# Patient Record
Sex: Female | Born: 1960
Health system: Southern US, Community
[De-identification: ages and names within clinical notes are randomized; demographics above are authoritative.]

## PROBLEM LIST (undated history)

## (undated) ENCOUNTER — Emergency Department (HOSPITAL_BASED_OUTPATIENT_CLINIC_OR_DEPARTMENT_OTHER): Admission: EM | Payer: Medicaid Other | Source: Home / Self Care

## (undated) DIAGNOSIS — C44309 Unspecified malignant neoplasm of skin of other parts of face: Secondary | ICD-10-CM

## (undated) DIAGNOSIS — F509 Eating disorder, unspecified: Secondary | ICD-10-CM

## (undated) DIAGNOSIS — B009 Herpesviral infection, unspecified: Secondary | ICD-10-CM

## (undated) DIAGNOSIS — F419 Anxiety disorder, unspecified: Secondary | ICD-10-CM

## (undated) DIAGNOSIS — R011 Cardiac murmur, unspecified: Secondary | ICD-10-CM

## (undated) DIAGNOSIS — N200 Calculus of kidney: Secondary | ICD-10-CM

## (undated) DIAGNOSIS — F4310 Post-traumatic stress disorder, unspecified: Secondary | ICD-10-CM

## (undated) DIAGNOSIS — A159 Respiratory tuberculosis unspecified: Secondary | ICD-10-CM

## (undated) DIAGNOSIS — N189 Chronic kidney disease, unspecified: Secondary | ICD-10-CM

## (undated) DIAGNOSIS — F329 Major depressive disorder, single episode, unspecified: Secondary | ICD-10-CM

## (undated) DIAGNOSIS — F32A Depression, unspecified: Secondary | ICD-10-CM

## (undated) DIAGNOSIS — G40909 Epilepsy, unspecified, not intractable, without status epilepticus: Secondary | ICD-10-CM

## (undated) HISTORY — DX: Anxiety disorder, unspecified: F41.9

## (undated) HISTORY — PX: CERVICAL POLYPECTOMY: SHX88

## (undated) HISTORY — DX: Eating disorder, unspecified: F50.9

## (undated) HISTORY — DX: Chronic kidney disease, unspecified: N18.9

## (undated) HISTORY — PX: OTHER SURGICAL HISTORY: SHX169

## (undated) HISTORY — DX: Calculus of kidney: N20.0

## (undated) HISTORY — PX: KIDNEY SURGERY: SHX687

## (undated) HISTORY — DX: Epilepsy, unspecified, not intractable, without status epilepticus: G40.909

## (undated) HISTORY — DX: Post-traumatic stress disorder, unspecified: F43.10

## (undated) HISTORY — DX: Major depressive disorder, single episode, unspecified: F32.9

## (undated) HISTORY — PX: LAPAROSCOPIC NEPHRECTOMY: SUR781

## (undated) HISTORY — DX: Cardiac murmur, unspecified: R01.1

## (undated) HISTORY — PX: CHOLECYSTECTOMY: SHX55

## (undated) HISTORY — DX: Herpesviral infection, unspecified: B00.9

## (undated) HISTORY — DX: Unspecified malignant neoplasm of skin of other parts of face: C44.309

## (undated) HISTORY — DX: Respiratory tuberculosis unspecified: A15.9

## (undated) HISTORY — DX: Depression, unspecified: F32.A

---

## 2008-10-02 HISTORY — PX: LAPAROSCOPIC NEPHRECTOMY: SUR781

## 2009-01-26 ENCOUNTER — Other Ambulatory Visit: Admission: RE | Admit: 2009-01-26 | Discharge: 2009-01-26 | Payer: Self-pay | Admitting: Family Medicine

## 2009-01-26 ENCOUNTER — Ambulatory Visit: Payer: Self-pay | Admitting: Family Medicine

## 2009-01-26 ENCOUNTER — Encounter: Payer: Self-pay | Admitting: Family Medicine

## 2009-01-26 DIAGNOSIS — R21 Rash and other nonspecific skin eruption: Secondary | ICD-10-CM | POA: Insufficient documentation

## 2009-01-26 DIAGNOSIS — R569 Unspecified convulsions: Secondary | ICD-10-CM | POA: Insufficient documentation

## 2009-01-26 DIAGNOSIS — M25569 Pain in unspecified knee: Secondary | ICD-10-CM | POA: Insufficient documentation

## 2009-01-27 ENCOUNTER — Encounter: Payer: Self-pay | Admitting: Family Medicine

## 2009-01-27 ENCOUNTER — Telehealth (INDEPENDENT_AMBULATORY_CARE_PROVIDER_SITE_OTHER): Payer: Self-pay | Admitting: *Deleted

## 2009-01-28 LAB — CONVERTED CEMR LAB: Pap Smear: NORMAL

## 2009-02-08 ENCOUNTER — Telehealth: Payer: Self-pay | Admitting: Family Medicine

## 2009-02-09 ENCOUNTER — Telehealth (INDEPENDENT_AMBULATORY_CARE_PROVIDER_SITE_OTHER): Payer: Self-pay | Admitting: *Deleted

## 2009-02-16 ENCOUNTER — Encounter: Admission: RE | Admit: 2009-02-16 | Discharge: 2009-02-16 | Payer: Self-pay | Admitting: Family Medicine

## 2009-02-17 ENCOUNTER — Encounter: Payer: Self-pay | Admitting: Family Medicine

## 2009-02-18 ENCOUNTER — Encounter: Payer: Self-pay | Admitting: Family Medicine

## 2009-02-18 LAB — CONVERTED CEMR LAB
ALT: 11 units/L (ref 0–35)
AST: 16 units/L (ref 0–37)
Albumin: 4.1 g/dL (ref 3.5–5.2)
Alkaline Phosphatase: 96 units/L (ref 39–117)
BUN: 12 mg/dL (ref 6–23)
CO2: 23 meq/L (ref 19–32)
Calcium: 9.1 mg/dL (ref 8.4–10.5)
Chloride: 106 meq/L (ref 96–112)
Cholesterol: 185 mg/dL (ref 0–200)
Creatinine, Ser: 0.73 mg/dL (ref 0.40–1.20)
Folate: 18.2 ng/mL
Glucose, Bld: 100 mg/dL — ABNORMAL HIGH (ref 70–99)
HDL: 61 mg/dL (ref >39)
Iron: 90 ug/dL (ref 42–145)
LDL Cholesterol: 99 mg/dL (ref 0–99)
Magnesium: 1.9 mg/dL (ref 1.5–2.5)
Potassium: 4.2 meq/L (ref 3.5–5.3)
Saturation Ratios: 26 % (ref 20–55)
Sodium: 141 meq/L (ref 135–145)
TIBC: 346 ug/dL (ref 250–470)
TSH: 2.388 microintl units/mL (ref 0.350–4.500)
Total Bilirubin: 0.4 mg/dL (ref 0.3–1.2)
Total CHOL/HDL Ratio: 3
Total Protein: 7 g/dL (ref 6.0–8.3)
Triglycerides: 123 mg/dL (ref <150)
UIBC: 256 ug/dL
VLDL: 25 mg/dL (ref 0–40)
Vit D, 25-Hydroxy: 24 ng/mL — ABNORMAL LOW (ref 30–89)
Vitamin B-12: 634 pg/mL (ref 211–911)

## 2009-03-03 ENCOUNTER — Ambulatory Visit: Payer: Self-pay | Admitting: Family Medicine

## 2009-03-24 ENCOUNTER — Encounter: Payer: Self-pay | Admitting: Emergency Medicine

## 2009-03-24 ENCOUNTER — Inpatient Hospital Stay (HOSPITAL_COMMUNITY): Admission: EM | Admit: 2009-03-24 | Discharge: 2009-03-29 | Payer: Self-pay | Admitting: Family Medicine

## 2009-03-24 ENCOUNTER — Ambulatory Visit: Payer: Self-pay | Admitting: Diagnostic Radiology

## 2009-03-24 ENCOUNTER — Ambulatory Visit: Payer: Self-pay | Admitting: Family Medicine

## 2009-03-24 ENCOUNTER — Encounter: Payer: Self-pay | Admitting: Family Medicine

## 2009-03-24 IMAGING — CT CT ABDOMEN W/O CM
2 of 3 series · 15 of 46 positions shown, 17 images · non-contrast
Comparison: None

CT ABDOMEN

CLINICAL DATA: Fever.  Right-sided pain.  The patient passed
"HUBER" today.  History of kidney stones 2 months ago.

CT ABDOMEN AND PELVIS WITHOUT CONTRAST
TECHNIQUE: Multidetector CT imaging of the abdomen and pelvis was
performed following the standard protocol without intravenous
contrast.

[Series 2: renal stone > 200 lbs 5.0 b31f · axial · 0.69mm/px · z∈[-418,-22]mm · 12 of 91 slices shown, 14 images]
[im 6/91  soft-tissue]
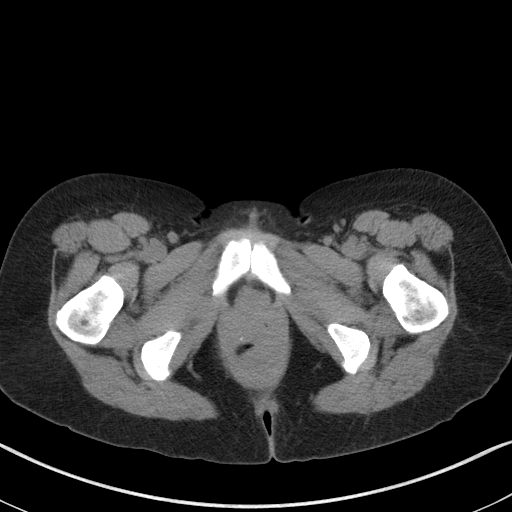
[im 6/91  bone]
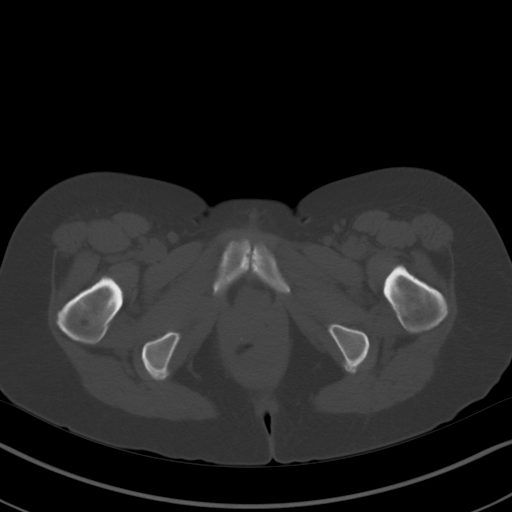
[im 12/91  soft-tissue]
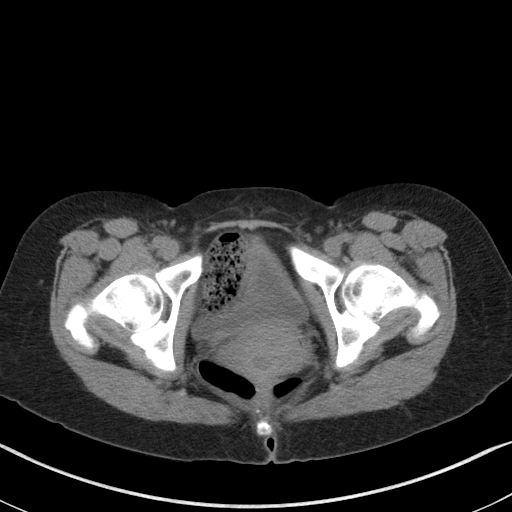
[im 21/91  soft-tissue]
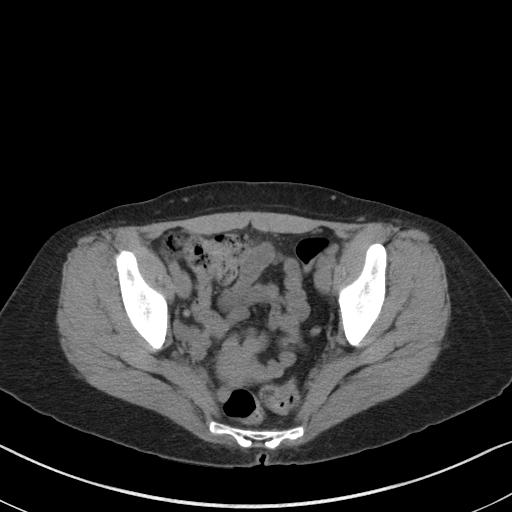
[im 27/91  soft-tissue]
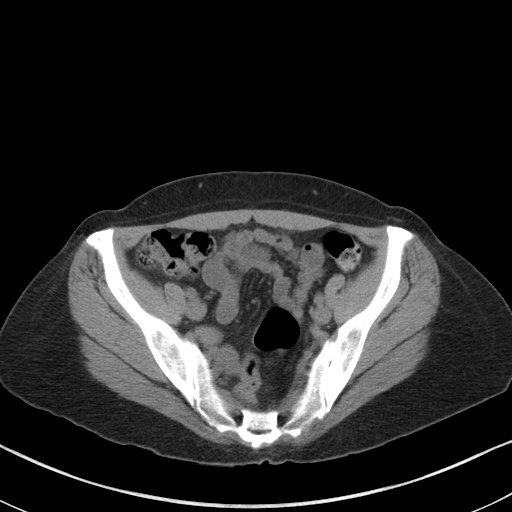
[im 35/91  soft-tissue]
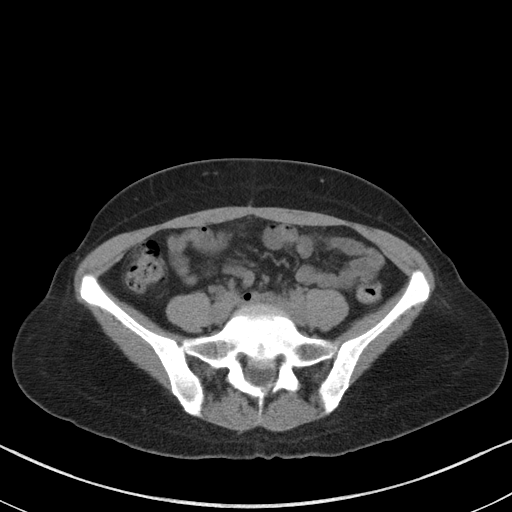
[im 41/91  soft-tissue]
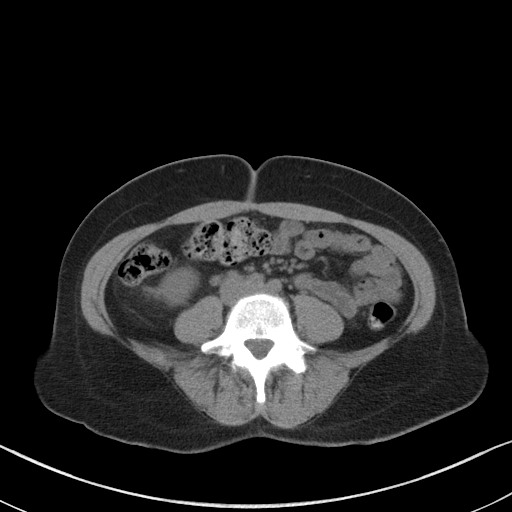
[im 50/91  soft-tissue]
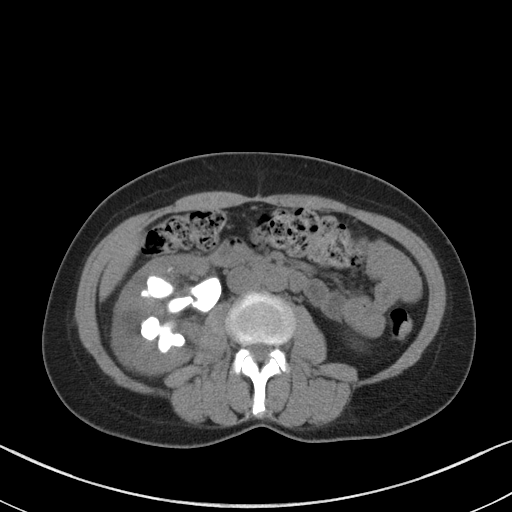
[im 56/91  soft-tissue]
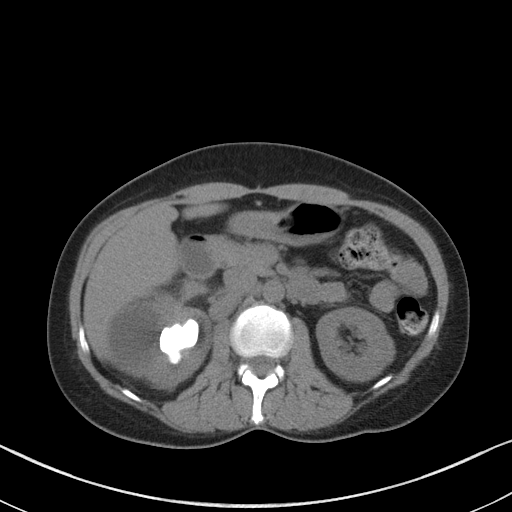
[im 64/91  soft-tissue]
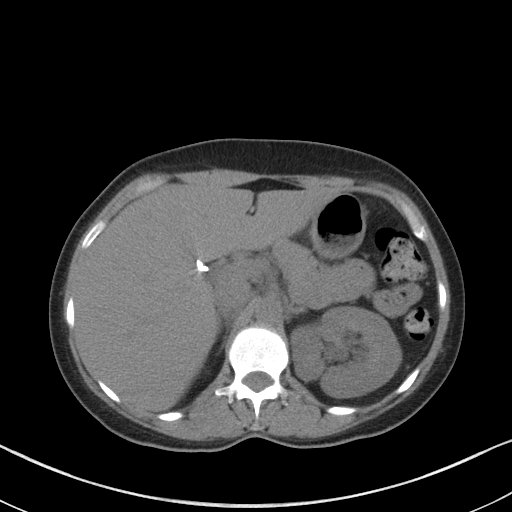
[im 64/91  bone]
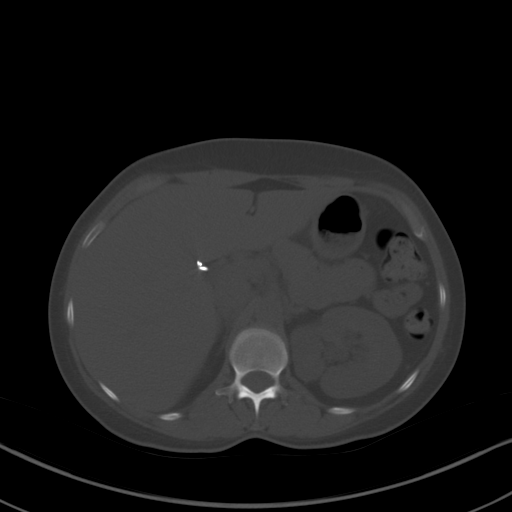
[im 70/91  soft-tissue]
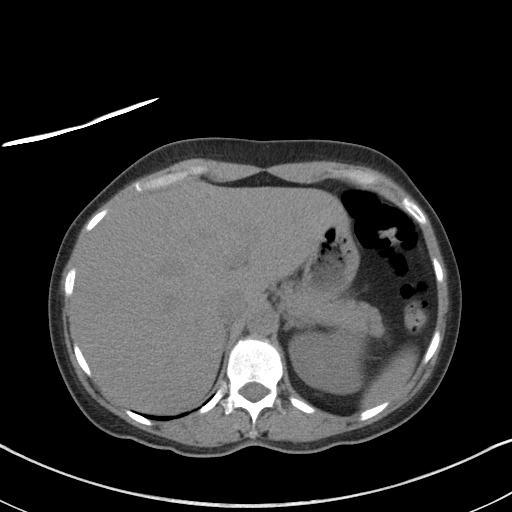
[im 79/91  soft-tissue]
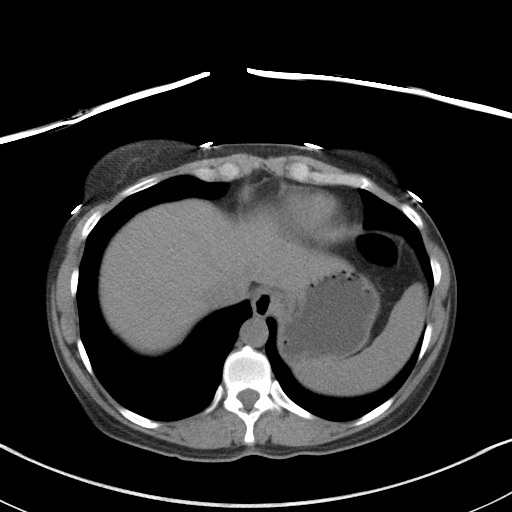
[im 85/91  soft-tissue]
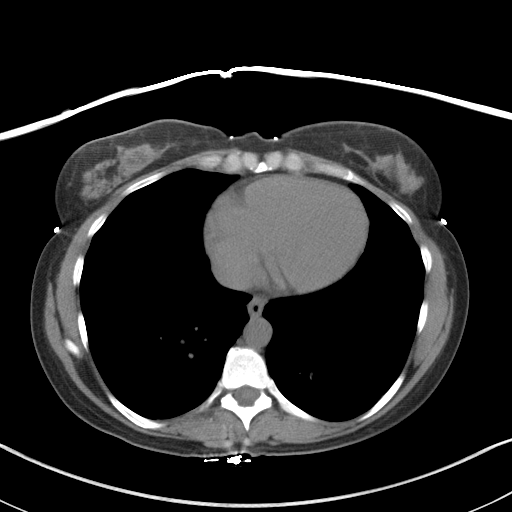

[Series 5: renal stone 3.0 coronal · coronal · 0.55mm/px · 3 of 81 slices shown]
[im 27/81  soft-tissue]
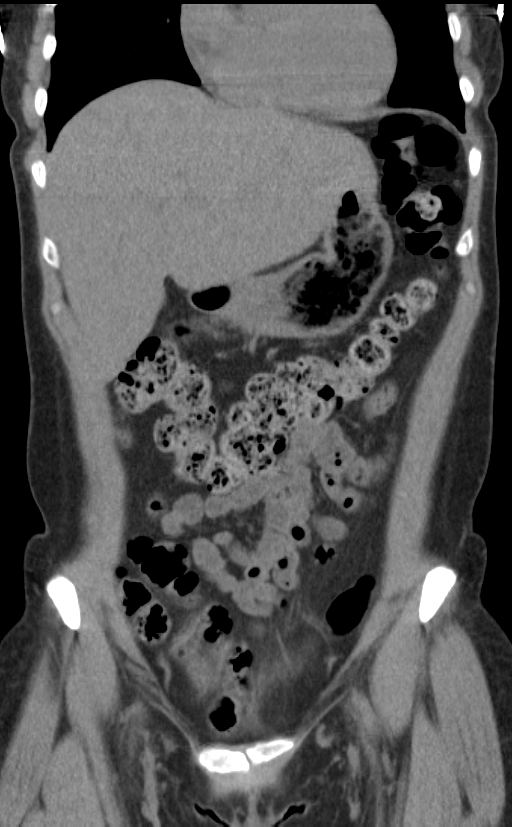
[im 36/81  soft-tissue]
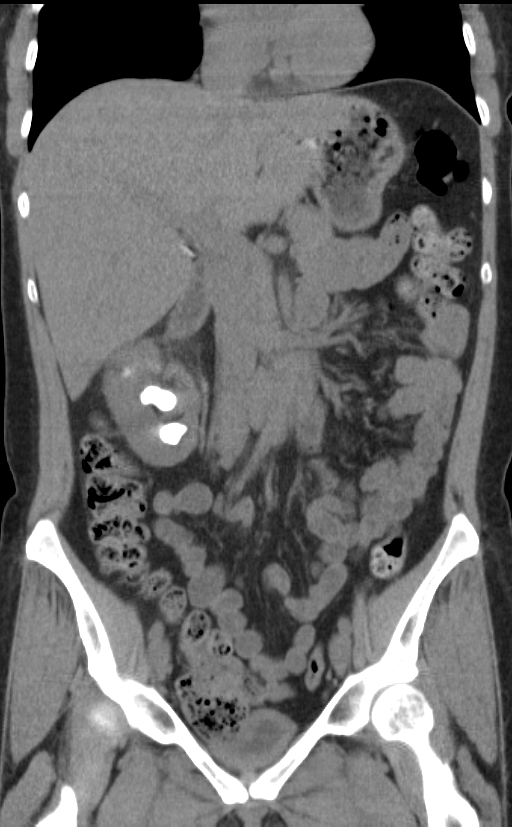
[im 45/81  soft-tissue]
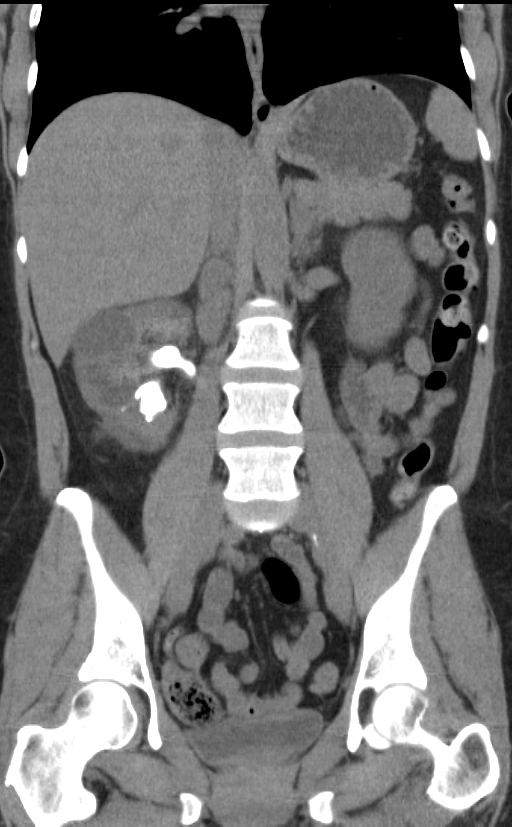

[15 of 46 positions shown; findings below may reference images not displayed]

FINDINGS: Images of the lung bases show no nodules, pleural
effusions, or infiltrates.

Large right staghorn calculus is identified, extending into the
right renal pelvis.  The right kidney is enlarged, measuring 12 cm
in cranial caudal length. There are low attenuation lesions in the
right kidney, possibly representing cysts.  However, infection
should also be considered.  Xanthogranulomatous pyelonephritis is a
consideration.  The largest low attenuation lesion is in the mid
pole region, measuring 4.1 cm in diameter.

There is adenopathy in the retroperitoneal region beginning just
below the level of the right adrenal gland and extending to the
level of the right renal hilum.  Lymph nodes measure on the order
of 2.5 cm in diameter.  There is also intra-aortocaval adenopathy.
Differential diagnosis includes infection or malignancy.

The left kidney contains scattered 2-3 mm calcifications.  No
ureteral calculi identified on the left.

The patient has had cholecystectomy.  No focal abnormality is
identified within the liver, spleen, pancreas, or adrenal glands.
IMPRESSION: 1.  Enlarged right kidney containing large staghorn calculus.
Multiple low-density lesions throughout the right kidney could
represent cysts, abscesses, low density solid masses, or chronic
infections such as xanthogranulomatous pyelonephritis.
2.  Retroperitoneal adenopathy could be related to chronic
infection or malignancy.  Follow-up is warranted.

CT PELVIS
FINDINGS: The uterus is present.  There is no free pelvic fluid or
pelvic adenopathy.  Adnexal regions have a normal appearance by CT.
Bone windows are unremarkable in appearance.
IMPRESSION: No evidence for acute pelvic abnormality.

Critical test results telephoned to Dr. HUBER at the time of
interpretation on [DATE] at [DATE] p.m.

## 2009-03-25 IMAGING — CT CT ABDOMEN W/ CM
2 of 8 series · 13 of 46 positions shown, 19 images · IV contrast (APPLIED)
Comparison: Yesterday

CT ABDOMEN

CLINICAL DATA: Fever and right side pain

CT ABDOMEN  WITH CONTRAST
TECHNIQUE: Multidetector CT imaging of the abdomen was performed
using the standard protocol following bolus administration of
intravenous contrast.
Contrast: 100 ml [GZ]

[Series 2: renal arterial 3.0 b31f st · axial · arterial · 0.59mm/px · z∈[-315,-51]mm · 10 of 106 slices shown, 16 images]
[im 9/106  soft-tissue]
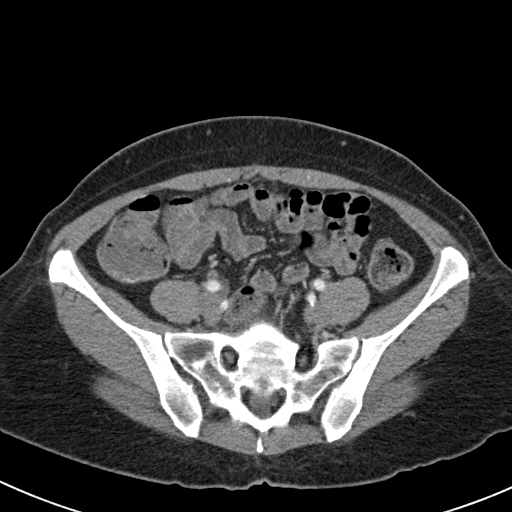
[im 9/106  bone]
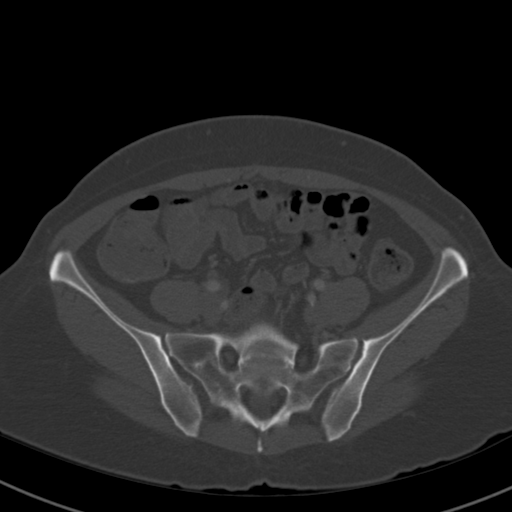
[im 18/106  soft-tissue]
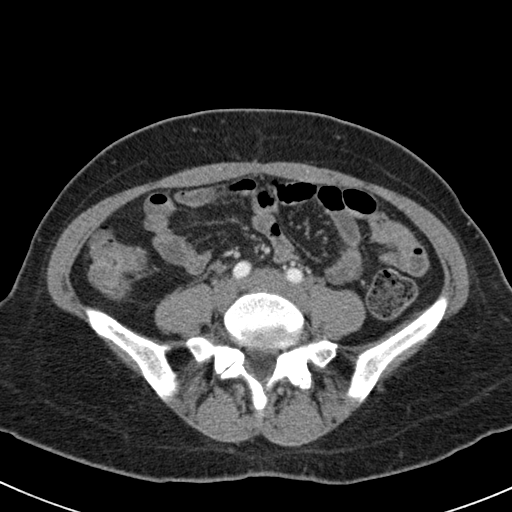
[im 27/106  soft-tissue]
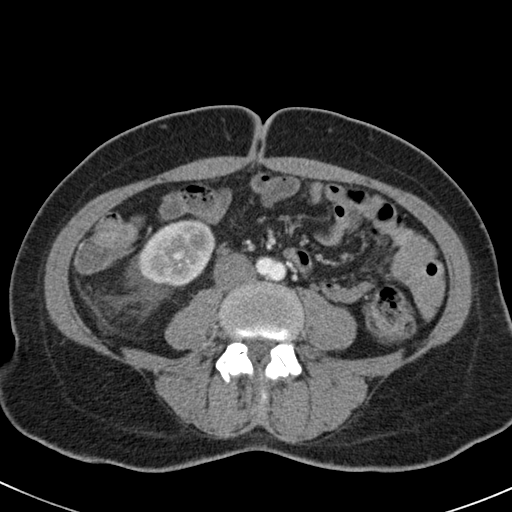
[im 36/106  soft-tissue]
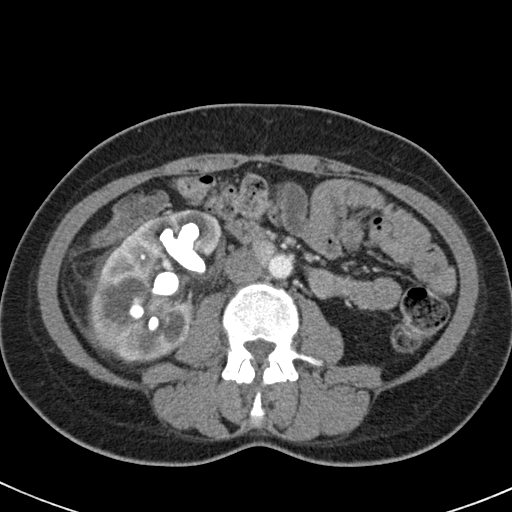
[im 44/106  soft-tissue]
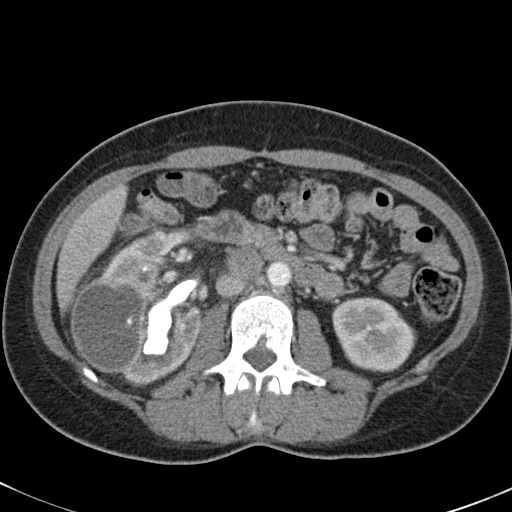
[im 62/106  soft-tissue]
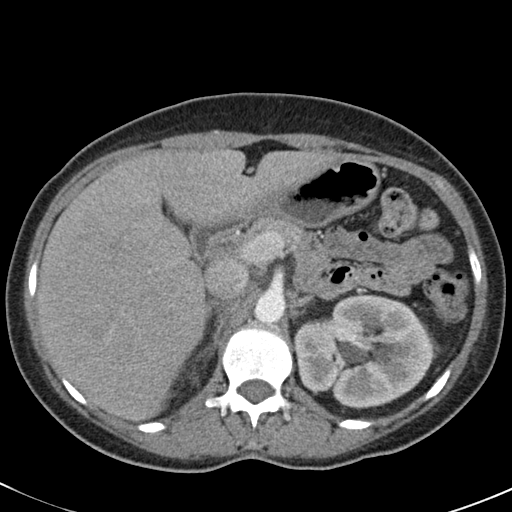
[im 71/106  soft-tissue]
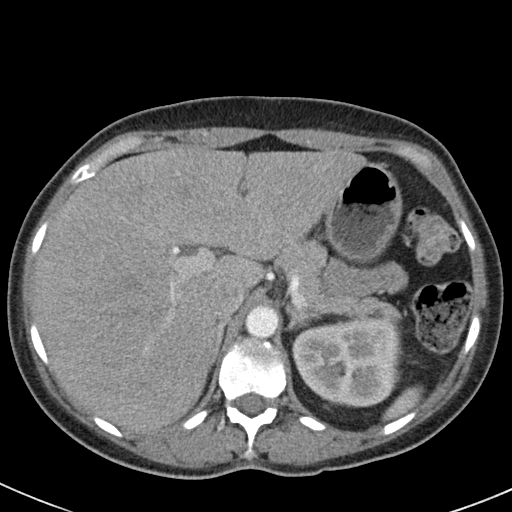
[im 71/106  lung]
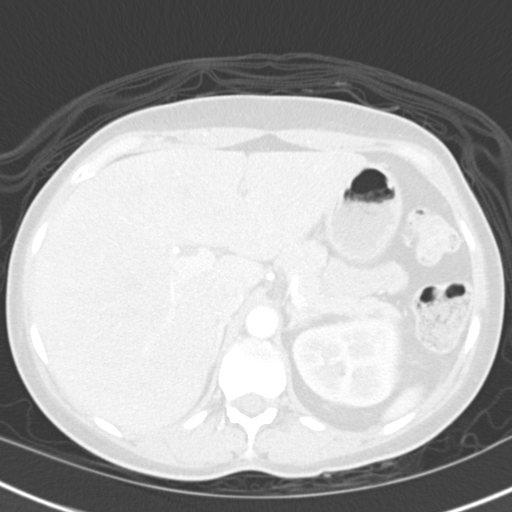
[im 79/106  soft-tissue]
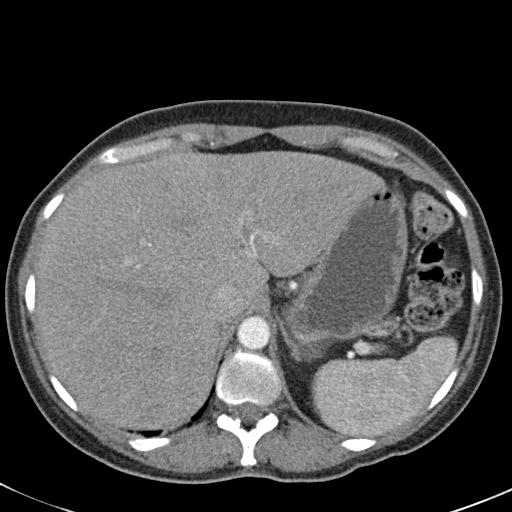
[im 79/106  lung]
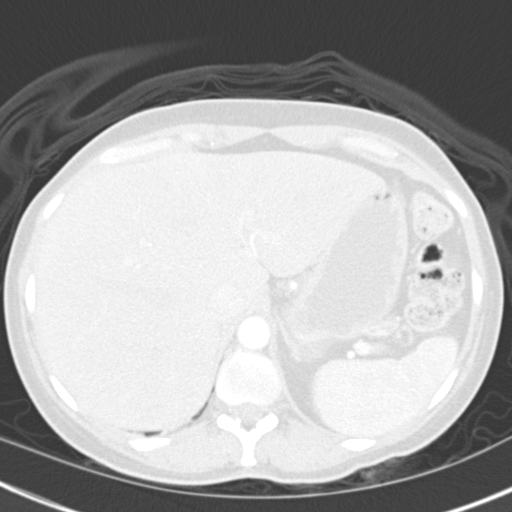
[im 88/106  soft-tissue]
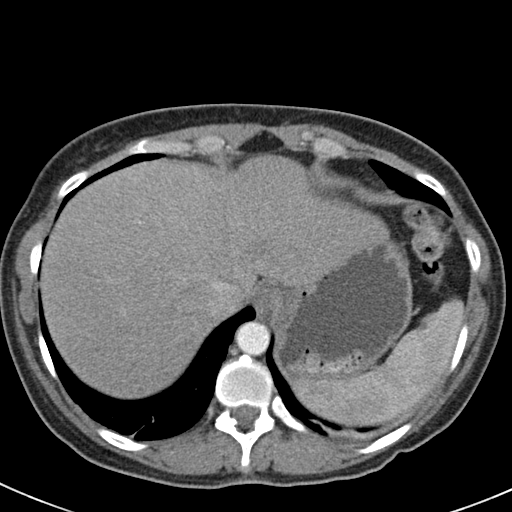
[im 88/106  lung]
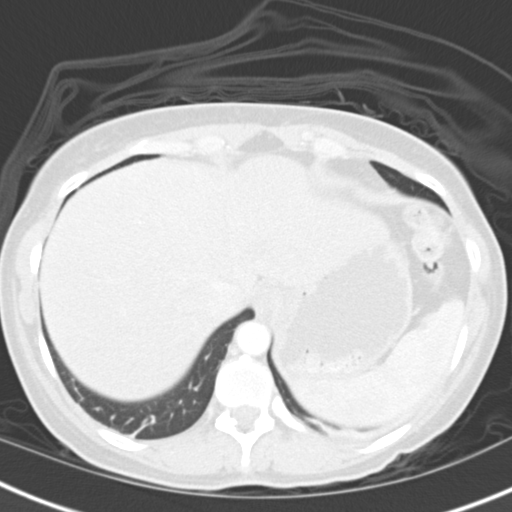
[im 88/106  bone]
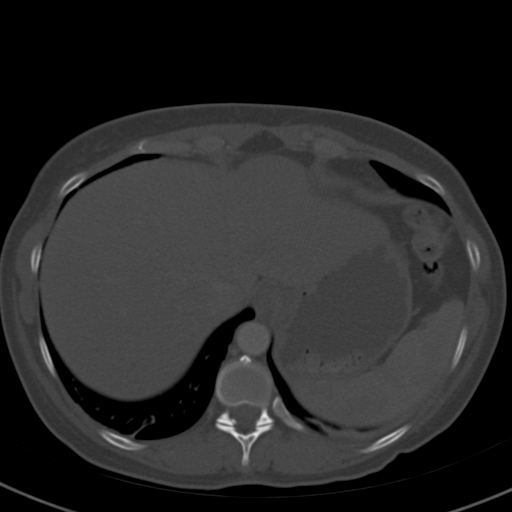
[im 97/106  soft-tissue]
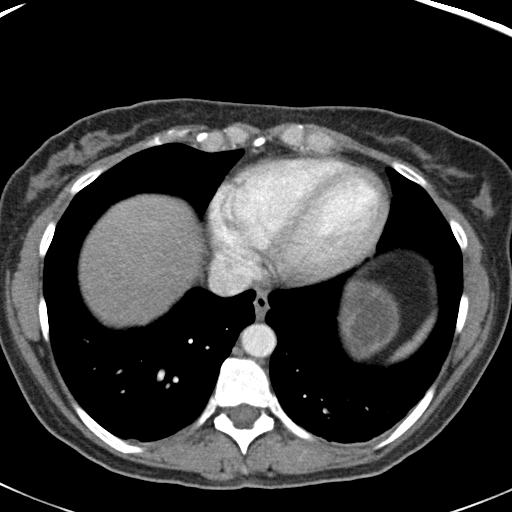
[im 97/106  lung]
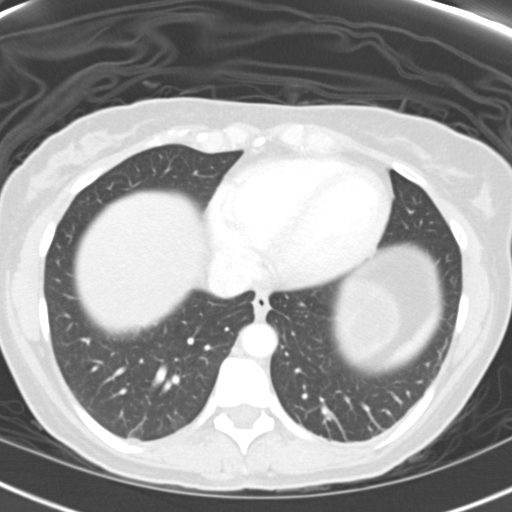

[Series 602: cor arterial · coronal · arterial · 0.62mm/px · 3 of 72 slices shown]
[im 18/72  soft-tissue]
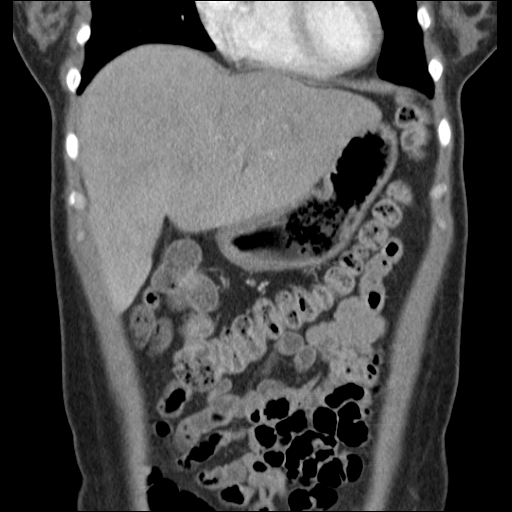
[im 36/72  soft-tissue]
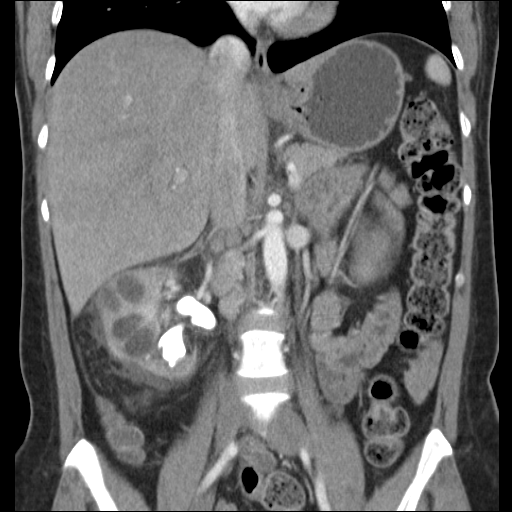
[im 54/72  soft-tissue]
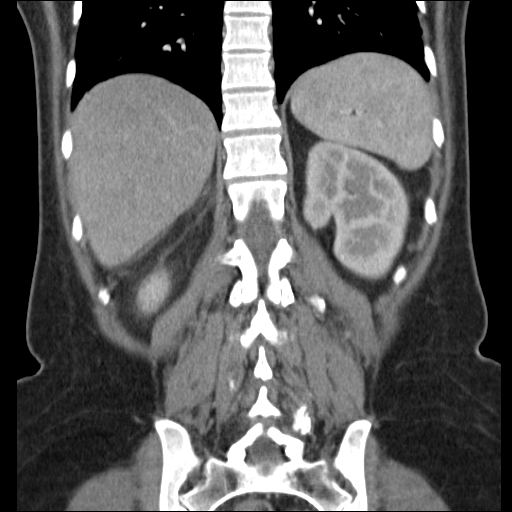

[13 of 46 positions shown; findings below may reference images not displayed]

FINDINGS: There is a right staghorn renal calculus which is
fragmented.  Low density areas are seen surrounding many portions
of the staghorn calculus within the medullary pyramids.  There is
also a large fluid collection with an enhancing rim in the
interpolar region measuring 5.1 x 4.2 cm.  Inflammatory changes
about the right kidney are noted with perinephric stranding.  The
posterior abdominal wall is intact.  Left kidney is unremarkable.

Mild bibasilar atelectasis.  The liver, spleen, pancreas, adrenal
glands are within normal limits.

Retrocaval adenopathy is present adjacent to the right kidney.
Large short axis diameter node is seen on image 58 with a
measurements of 1.5 cm.
IMPRESSION: Findings most consistent with xanthogranulomatous pyelonephritis of
the right kidney.  A focal abscess in the interpolar region is
suspected.  Fragmented staghorn calculus on the right is
associated.

Retrocaval and aortocaval adenopathy is likely inflammatory in
origin.  Follow-up imaging to ensure resolution is recommended as
neoplasm is not excluded.

## 2009-03-26 IMAGING — XA IR PERC NEPHROSTOMY*R*
1 series · 6 of 6 positions shown · non-contrast
Comparison: none

CLINICAL DATA: Staghorn calculus, acute pyelonephritis, fever,
elevated white count

[Series 1: run · 6 of 6 slices shown]
[im 1/6]
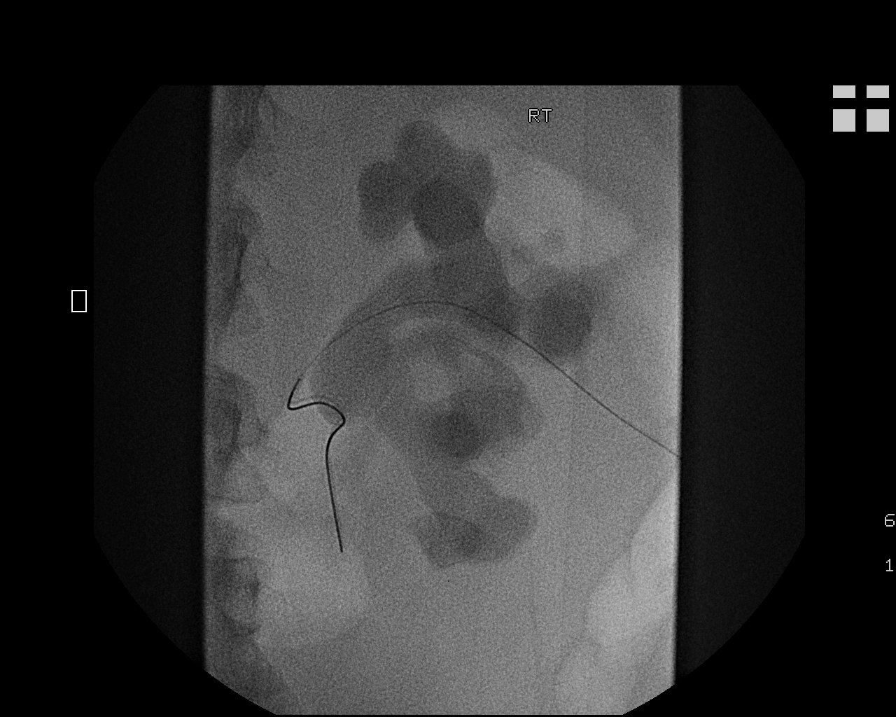
[im 2/6]
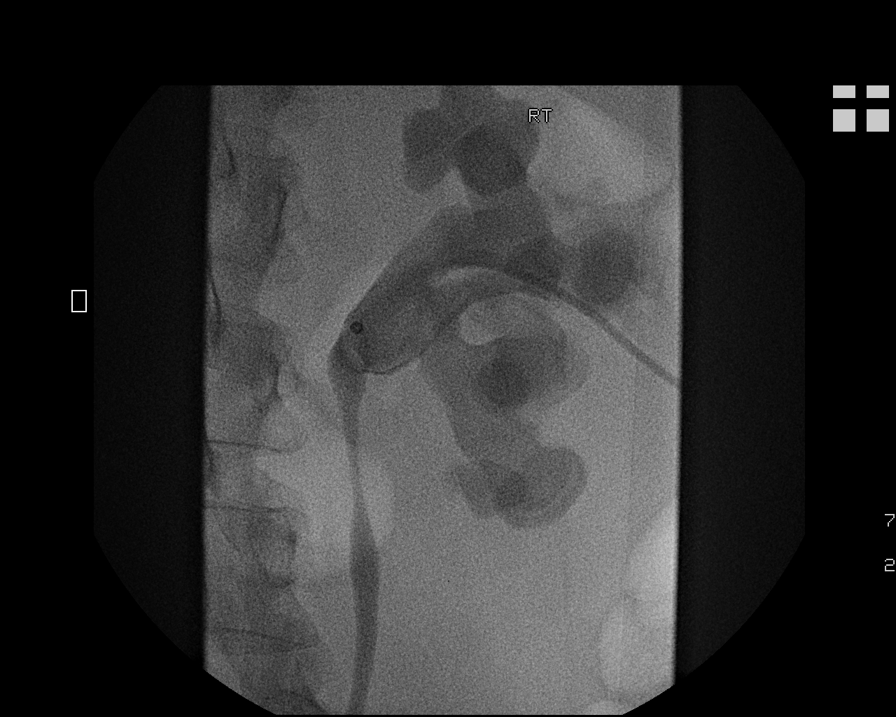
[im 3/6]
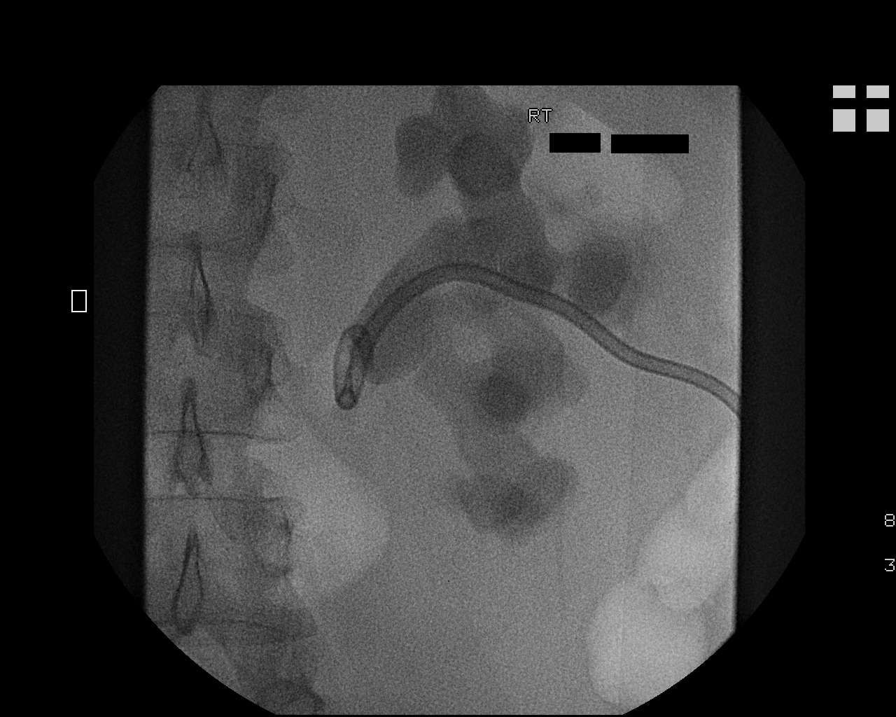
[im 4/6]
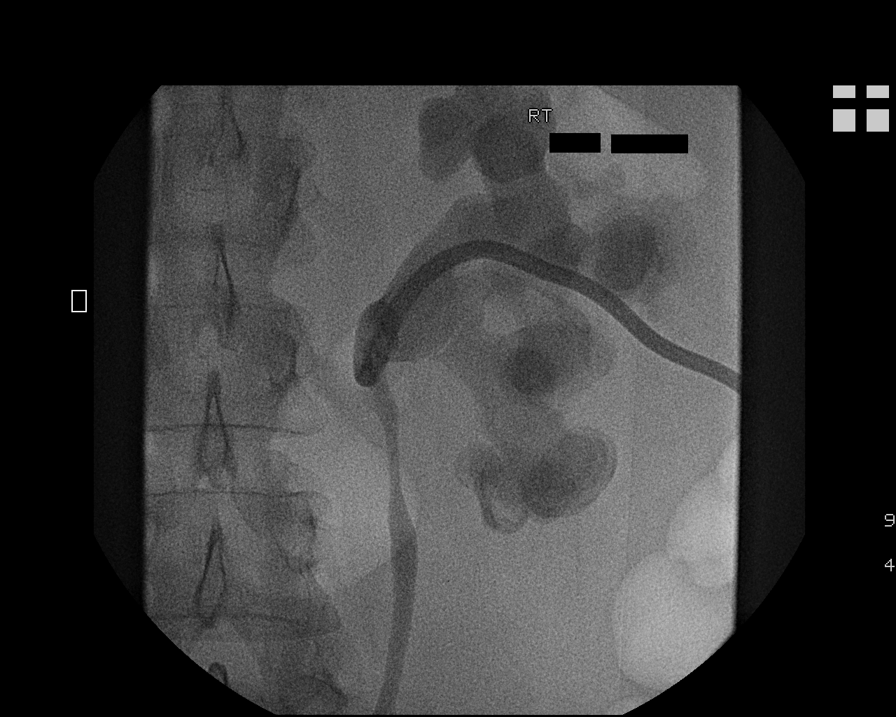
[im 5/6]
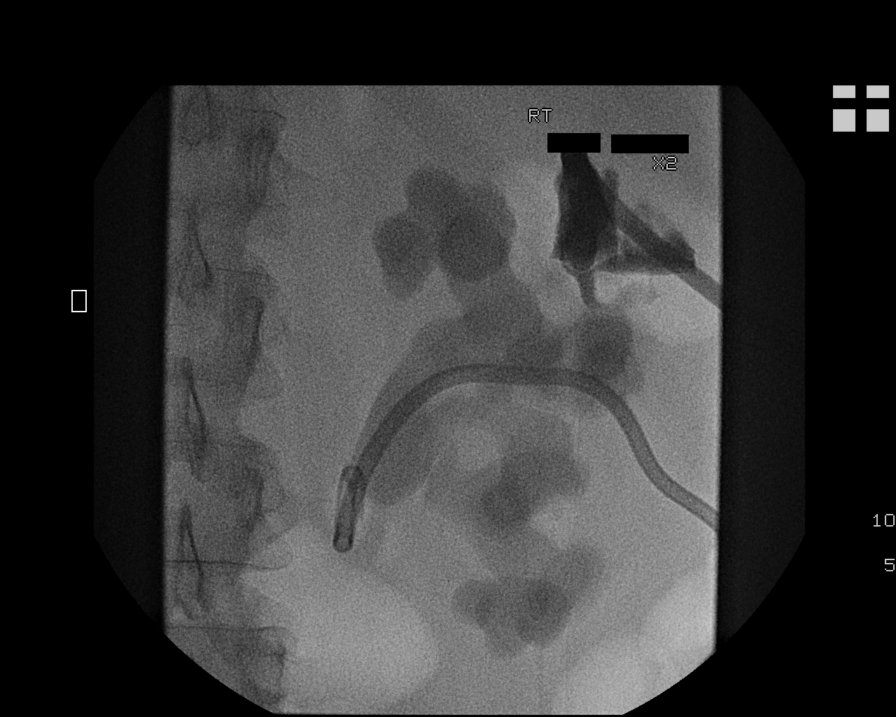
[im 6/6]
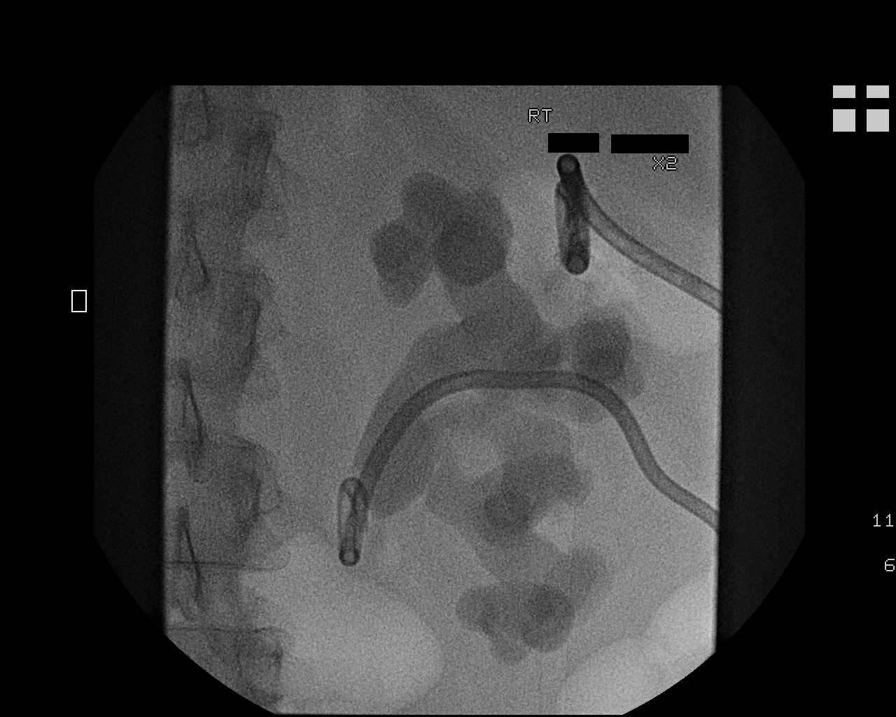

[6 of 6 positions shown; findings below may reference images not displayed]

ULTRASOUND GUIDANCE FOR ACCESS
10-FRENCH RIGHT NEPHROSTOMY INSERTION
10-FRENCH RIGHT UPPER POLE RENAL ABSCESS DRAINAGE

Date:  [DATE] [DATE]

Radiologist:  ADILIO, M.D.

Medications:  The patient received 1 gram vancomycin and 1 gram
Rocephin.  For conscious sedation she received 4 mg Versed, 100 mcg
Fentanyl

Guidance:  Ultrasound and fluoroscopic

Fluoroscopy time:  2.7 minutes

Sedation time:  37 minutes

Contrast volume:  10 ml [DV]

Complications:  No immediate

PROCEDURE/FINDINGS:

Informed consent was obtained from the patient following
explanation of the procedure, risks, benefits and alternatives.
The patient understands, agrees and consents for the procedure.
All questions were addressed.  A time out was performed.

Maximal barrier sterile technique utilized including caps, mask,
sterile gowns, sterile gloves, large sterile drape, hand hygiene,
and betadine prep.

Previous CT scan was reviewed.  The patient was positioned prone.
Preliminary ultrasound performed of the right kidney.

Right nephrostomy insertion:  Under sterile conditions and local
anesthesia, a 21 gauge 15 cm access needle was advanced into a
dilated mid pole calix containing a portion of the staghorn
calculus.  Staghorn calculus was felt on the needle tip.  Contrast
injection confirms position.  The guide wire was inserted which
advance centrally to the renal pelvis.  The transitional dilator
was advanced over the guide wire.  Contrast injection confirms
position in the renal pelvis.  An Amplatz guide wire was inserted
followed by tract dilatation to advance a 10-ADILIO
nephrostomy catheter.  Retention loop formed in the renal pelvis
adjacent to the staghorn calculus.  Contrast injection confirms
position.  Catheter secured with a Prolene suture and connected to
external drainage.

Right upper pole renal abscess drainage:  Under sterile conditions
and ultrasound guidance, an 18 gauge introducer needle was advanced
into the complex right upper pole fluid collection.  Cloudy
exudative urine/fluid was aspirated.  An Amplatz guide wire was
inserted followed by tract dilatation to advance a 10-French
drainage catheter.  Retention loop formed in the abscess cavity.
Syringe aspiration yielded 50 ml of exudative fluid.  This
decompressed the abscess cavity.  Contrast injection was performed
confirming position.  Catheter secured with a Prolene suture and
connected to external suction bulb.  Sterile dressing applied.

The patient tolerated procedure the well.  No immediate
complication.
IMPRESSION: Ultrasound and fluoroscopically guided right 10-French nephrostomy
insertion (ADILIO catheter)

Ultrasound and fluoroscopically guided right upper pole renal
abscess drainage with insertion of a 10-French drain and removal of
50 ml exudative fluid.  Sample sent for Gram stain and culture.

## 2009-04-21 ENCOUNTER — Encounter: Payer: Self-pay | Admitting: Family Medicine

## 2009-04-29 ENCOUNTER — Encounter: Payer: Self-pay | Admitting: Family Medicine

## 2009-05-05 ENCOUNTER — Encounter: Payer: Self-pay | Admitting: Family Medicine

## 2009-05-13 ENCOUNTER — Encounter: Payer: Self-pay | Admitting: Family Medicine

## 2009-06-10 ENCOUNTER — Encounter: Payer: Self-pay | Admitting: Family Medicine

## 2009-06-14 ENCOUNTER — Ambulatory Visit: Payer: Self-pay | Admitting: Family Medicine

## 2009-06-14 DIAGNOSIS — F411 Generalized anxiety disorder: Secondary | ICD-10-CM | POA: Insufficient documentation

## 2009-06-16 ENCOUNTER — Telehealth: Payer: Self-pay | Admitting: Family Medicine

## 2009-06-17 ENCOUNTER — Telehealth: Payer: Self-pay | Admitting: Family Medicine

## 2009-06-25 ENCOUNTER — Encounter: Payer: Self-pay | Admitting: Family Medicine

## 2009-07-01 ENCOUNTER — Encounter: Payer: Self-pay | Admitting: Family Medicine

## 2009-07-07 ENCOUNTER — Encounter: Payer: Self-pay | Admitting: Family Medicine

## 2009-07-15 ENCOUNTER — Encounter: Payer: Self-pay | Admitting: Family Medicine

## 2009-08-10 ENCOUNTER — Ambulatory Visit: Payer: Self-pay | Admitting: Family Medicine

## 2009-09-03 ENCOUNTER — Ambulatory Visit: Payer: Self-pay | Admitting: Family Medicine

## 2009-09-03 DIAGNOSIS — N951 Menopausal and female climacteric states: Secondary | ICD-10-CM | POA: Insufficient documentation

## 2009-09-10 ENCOUNTER — Telehealth (INDEPENDENT_AMBULATORY_CARE_PROVIDER_SITE_OTHER): Payer: Self-pay | Admitting: *Deleted

## 2009-10-07 ENCOUNTER — Encounter: Payer: Self-pay | Admitting: Family Medicine

## 2009-10-15 ENCOUNTER — Ambulatory Visit: Payer: Self-pay | Admitting: Family Medicine

## 2009-10-15 DIAGNOSIS — J069 Acute upper respiratory infection, unspecified: Secondary | ICD-10-CM | POA: Insufficient documentation

## 2009-10-15 DIAGNOSIS — B370 Candidal stomatitis: Secondary | ICD-10-CM | POA: Insufficient documentation

## 2009-10-21 ENCOUNTER — Telehealth: Payer: Self-pay | Admitting: Family Medicine

## 2009-11-01 ENCOUNTER — Telehealth: Payer: Self-pay | Admitting: Family Medicine

## 2009-11-02 ENCOUNTER — Ambulatory Visit: Payer: Self-pay | Admitting: Family Medicine

## 2009-11-03 ENCOUNTER — Encounter: Payer: Self-pay | Admitting: Family Medicine

## 2010-01-26 ENCOUNTER — Encounter: Payer: Self-pay | Admitting: Family Medicine

## 2010-01-26 ENCOUNTER — Ambulatory Visit: Payer: Self-pay | Admitting: Family Medicine

## 2010-01-26 DIAGNOSIS — N3000 Acute cystitis without hematuria: Secondary | ICD-10-CM | POA: Insufficient documentation

## 2010-01-26 LAB — CONVERTED CEMR LAB
Bilirubin Urine: NEGATIVE
Glucose, Urine, Semiquant: NEGATIVE
Ketones, urine, test strip: NEGATIVE
Nitrite: NEGATIVE
Protein, U semiquant: NEGATIVE
Specific Gravity, Urine: <1.005
Urobilinogen, UA: 0.2
pH: 6.5

## 2010-01-27 ENCOUNTER — Encounter: Payer: Self-pay | Admitting: Family Medicine

## 2010-01-27 LAB — CONVERTED CEMR LAB
BUN: 9 mg/dL (ref 6–23)
CO2: 21 meq/L (ref 19–32)
Calcium: 9.1 mg/dL (ref 8.4–10.5)
Chloride: 103 meq/L (ref 96–112)
Creatinine, Ser: 0.78 mg/dL (ref 0.40–1.20)
Glucose, Bld: 96 mg/dL (ref 70–99)
Potassium: 4.1 meq/L (ref 3.5–5.3)
Sodium: 137 meq/L (ref 135–145)

## 2010-03-18 ENCOUNTER — Ambulatory Visit: Payer: Self-pay | Admitting: Family Medicine

## 2010-03-18 DIAGNOSIS — L03119 Cellulitis of unspecified part of limb: Secondary | ICD-10-CM

## 2010-03-18 DIAGNOSIS — L02419 Cutaneous abscess of limb, unspecified: Secondary | ICD-10-CM | POA: Insufficient documentation

## 2010-03-24 ENCOUNTER — Encounter: Payer: Self-pay | Admitting: Family Medicine

## 2010-04-13 ENCOUNTER — Ambulatory Visit: Payer: Self-pay | Admitting: Family

## 2010-04-13 LAB — CONVERTED CEMR LAB: Rapid Strep: NEGATIVE

## 2010-05-15 ENCOUNTER — Ambulatory Visit: Payer: Self-pay | Admitting: Family Medicine

## 2010-05-15 DIAGNOSIS — J029 Acute pharyngitis, unspecified: Secondary | ICD-10-CM | POA: Insufficient documentation

## 2010-05-15 LAB — CONVERTED CEMR LAB: Rapid Strep: NEGATIVE

## 2010-05-16 ENCOUNTER — Encounter: Payer: Self-pay | Admitting: Family Medicine

## 2010-05-17 ENCOUNTER — Telehealth (INDEPENDENT_AMBULATORY_CARE_PROVIDER_SITE_OTHER): Payer: Self-pay | Admitting: *Deleted

## 2010-09-06 ENCOUNTER — Ambulatory Visit (HOSPITAL_COMMUNITY): Payer: Self-pay | Admitting: Licensed Clinical Social Worker

## 2010-09-16 ENCOUNTER — Ambulatory Visit (HOSPITAL_COMMUNITY): Payer: Self-pay | Admitting: Licensed Clinical Social Worker

## 2010-09-29 ENCOUNTER — Ambulatory Visit (HOSPITAL_COMMUNITY): Payer: Self-pay | Admitting: Licensed Clinical Social Worker

## 2010-10-06 ENCOUNTER — Ambulatory Visit (HOSPITAL_COMMUNITY)
Admission: RE | Admit: 2010-10-06 | Discharge: 2010-10-06 | Payer: Self-pay | Source: Home / Self Care | Attending: Licensed Clinical Social Worker | Admitting: Licensed Clinical Social Worker

## 2010-10-13 ENCOUNTER — Ambulatory Visit (HOSPITAL_COMMUNITY)
Admission: RE | Admit: 2010-10-13 | Discharge: 2010-10-13 | Payer: Self-pay | Source: Home / Self Care | Attending: Licensed Clinical Social Worker | Admitting: Licensed Clinical Social Worker

## 2010-10-20 ENCOUNTER — Ambulatory Visit (HOSPITAL_COMMUNITY)
Admission: RE | Admit: 2010-10-20 | Discharge: 2010-10-20 | Payer: Self-pay | Source: Home / Self Care | Attending: Licensed Clinical Social Worker | Admitting: Licensed Clinical Social Worker

## 2010-10-24 ENCOUNTER — Encounter: Payer: Self-pay | Admitting: Urology

## 2010-10-24 ENCOUNTER — Encounter: Payer: Self-pay | Admitting: Family Medicine

## 2010-11-01 ENCOUNTER — Ambulatory Visit (HOSPITAL_COMMUNITY)
Admission: RE | Admit: 2010-11-01 | Discharge: 2010-11-01 | Payer: Self-pay | Source: Home / Self Care | Attending: Licensed Clinical Social Worker | Admitting: Licensed Clinical Social Worker

## 2010-11-01 NOTE — Progress Notes (Signed)
Summary: Autumn Garcia still present  Phone Note Call from Patient Call back at [phone]   Caller: Patient Call For: Nani Gasser MD Summary of Call: The pill you gave her did help but has not gone away- can she have some more hurts to eat mouth feels so raw. Send to [facility] on [address] in [place]- its the one with the highest street number Initial call taken by: Kathlene November,  October 21, 2009 4:36 PM    Prescriptions: FLUCONAZOLE 150 MG TABS (FLUCONAZOLE) Take 1 tablet by mouth once a day for 2 days. Can repeat if needed.  #4 x 0   Entered and Authorized by:   Nani Gasser MD   Signed by:   Nani Gasser MD on 10/21/2009   Method used:   Electronically to        [phone]* (retail)       [address] [place], [place]  [address]       Ph: [phone]       Fax: [phone]   RxID:   [id number]

## 2010-11-01 NOTE — Assessment & Plan Note (Signed)
Summary: sore throat /mhf--rm 5   Vital Signs:  Patient profile:   50 year old female Menstrual status:  regular Height:      63.75 inches Weight:      132.25 pounds BMI:     22.96 Temp:     97.8 degrees F oral Pulse rate:   60 / minute Pulse rhythm:   regular Resp:     16 per minute BP sitting:   90 / 68  (right arm) Cuff size:   regular  Vitals Entered By: Mervin Kung CMA Duncan Dull) (April 13, 2010 3:38 PM) CC: Room 5  Sore throat and swollen neck glands x 1 day.  States that she stopped having periods after right nephrectomy last year but has returned for the last 3 months; pt would like to go on birth control. Is Patient Diabetic? No Comments Pt has completed diflucan. Stopped zoloft per neurologist due to seizures.   Primary Care Provider:  Nani Gasser MD  CC:  Room 5  Sore throat and swollen neck glands x 1 day.  States that she stopped having periods after right nephrectomy last year but has returned for the last 3 months; pt would like to go on birth control.Marland Kitchen  History of Present Illness: Ms Lequita Halt is a 50 year old female who presents today with complaint of sore throat and swollen glands x 1 day.  + Sneezing and coughing.  Has not tried any otc meds.  + sneezing/coughing,  denies ear pain.    Patient notes that she started having periods again.  Had surgery 9/10 and did not have periods again until  the last 3 months.  Patient is requesting birth control pills. Asked patient if she is requesting OCP's to regulate periods or to prevent pregnancy.  She tells me "both." Though she tells me that she may want to try to become pregnant "if my periods come back." Explained to patient that she is likely perimenopausal and that I am not comfortable initiating OCP's but that I am happy to refer her to GYN to further evaluate her for perimenopause and to determine if OCP's are a good birth control option for her.  Patient stated "that will be fine."  When I asked patient where she  lives so that I could choose a convenient referral site, she started to tell me her street address, then mid sentence stormed out of room.  Husband turned to me and told me that this was an anxiety attack.  Patient walked past front desk and according to secretary stated, "Efraim Kaufmann is a bitch and she is unreasonable, I am never coming back to [place]."  Pt left before being examined.    Allergies: 1)  ! Codeine 2)  ! Demerol 3)  ! Erythromycin 4)  ! Cipro 5)  ! * Tomatoes 6)  ! * Strawberries   Complete Medication List: 1)  Clonazepam 0.25 Mg Tbdp (Clonazepam) .... Take 1 tab by mouth two times a day 2)  Zoloft 25 Mg Tabs (Sertraline hcl) .... Take 1 tab by mouth once daily 3)  Diflucan 150 Mg Tabs (Fluconazole) .... Once daily for yeast symptoms.  repeat in 72 hrs if sxs persist  Other Orders: Rapid Strep (40981)  Patient Instructions: 1)  You will be contacted about your referral to GYN  Current Allergies (reviewed today): ! CODEINE ! DEMEROL ! ERYTHROMYCIN ! CIPRO ! * TOMATOES ! * STRAWBERRIES  Laboratory Results    Other Tests  Rapid Strep: negative  Kit  Test Internal QC: Positive   (Normal Range: Negative)

## 2010-11-01 NOTE — Progress Notes (Signed)
Summary: Thrush  Phone Note Call from Patient Call back at [facility] Phone [phone] Call back at [phone]   Caller: Patient Call For: Nani Gasser MD Summary of Call: Thrush hasn't went away- done 2 rounds of diflucan, dukes magic mouth wash, using acidophilus. Please advise what else to do. Uses [facility] [address] [place]  Follow-up for Phone Call        Recommend throat culture  for yeast and bacteria to confirm.  Can do wiht a nurse visit.  Follow-up by: Nani Gasser MD,  November 01, 2009 3:06 PM  Additional Follow-up for Phone Call Additional follow up Details #1::        Pt notified of MD instructions. KJ LPN Additional Follow-up by: Kathlene November,  November 01, 2009 3:27 PM

## 2010-11-01 NOTE — Progress Notes (Signed)
Summary: SE to Cipro Rx's by Dr. Creed Copper  Phone Note Call from Patient   Caller: Patient Summary of Call: Pt [facility] stating she was given Cipro by Dr. Creed Copper and she can not take it bc it causes suicidal thoughts. Pt has tried to call Dr. Ria Bush office but  has not been able to contact. Pt requests Dr. Judie Petit change Rx. I called and advised Pt not to take the med until she talks w/ Dr. Creed Copper and if she does not hear from him by tomorrow to give [place] a call back. Pt agrees.  Initial call taken by: Payton Spark CMA,  June 16, 2009 4:47 PM

## 2010-11-01 NOTE — Progress Notes (Signed)
Summary: talk to Dr.Metheney  Phone Note Call from Patient   Caller: Patient Summary of Call: Dr.Metheney    Call Back  [phone]  patient said Dr.Metheney called her yesterday and she called the wrong number, so the patient said if Dr.Metheney needs to talk to her she can call her at the number above. Initial call taken by: Vanessa [place],  January 27, 2009 11:38 AM  Follow-up for Phone Call        Can we change her numbner in [facility] to this number.  Follow-up by: Nani Gasser MD,  January 27, 2009 11:45 AM  Additional Follow-up for Phone Call Additional follow up Details #1::        I changed the number in [facility].  Thanks Additional Follow-up by: Vanessa [place],  January 27, 2009 12:00 PM      Appended Document: talk to Dr.Metheney Attemped to call [phone] and it has been disconnected. Need pt mothers anem for Dr. Tanya Nones.

## 2010-11-01 NOTE — Letter (Signed)
Summary: [facility]  [facility]   Imported By: Lanelle Bal 07/21/2009 07:58:24  _____________________________________________________________________  External Attachment:    Type:   Image     Comment:   External Document

## 2010-11-01 NOTE — Assessment & Plan Note (Signed)
Summary: NURSE vIsit  Nurse Visit   CC:  Pt came in office for persistent thrush- swabbed tongue for yeast and bacteria.  CC: Pt came in office for persistent thrush- swabbed tongue for yeast and bacteria   Current Medications (verified): 1)  Lamotrigine 150 Mg Tabs (Lamotrigine) .... Take 1 Tablet By Mouth Once A Day 2)  Citalopram Hydrobromide 20 Mg Tabs (Citalopram Hydrobromide) .... Take 1/2 Tablet By Mouth Once A Day in The Am For 1 Week Then Whole Tab A Day. 3)  Magic Mouth Wash (With Nystatin) .... Swish and Spit 5ml Three Times A Day For 1 Weeks. 4)  Fluconazole 150 Mg Tabs (Fluconazole) .... Take 1 Tablet By Mouth Once A Day For 2 Days. Can Repeat If Needed.  Allergies (verified): 1)  ! Codeine 2)  ! Demerol 3)  ! Erythromycin 4)  ! Cipro 5)  ! * Tomatoes 6)  ! * Strawberries  Orders Added: 1)  T-Culture, Throat [[id number]] 2)  T-KOH Prep Fungal [[id number]] 3)  Est. Patient Level I [53664]

## 2010-11-01 NOTE — Letter (Signed)
Summary: [facility]  [facility]   Imported By: Lanelle Bal 10/30/2009 11:34:32  _____________________________________________________________________  External Attachment:    Type:   Image     Comment:   External Document

## 2010-11-01 NOTE — Letter (Signed)
Summary: Handout Printed  Printed Handout:  - Rheumatic Fever 

## 2010-11-01 NOTE — Assessment & Plan Note (Signed)
Summary: sorethroat x 1 dy rm 4   Vital Signs:  Patient Profile:   50 Years Old Female CC:      sorethroat x today Height:     63.75 inches Weight:      133 pounds O2 Sat:      100 % O2 treatment:    Room Air Temp:     99.1 degrees F oral Pulse rate:   57 / minute Pulse rhythm:   regular Resp:     16 per minute BP sitting:   111 / 73  (left arm) Cuff size:   regular  Vitals Entered By: Areta Haber CMA (May 15, 2010 2:13 PM)                  Current Allergies: ! CODEINE ! DEMEROL ! ERYTHROMYCIN ! CIPRO ! * TOMATOES ! * STRAWBERRIES      History of Present Illness Chief Complaint: sorethroat x today History of Present Illness:  Subjective: Patient complains of sore throat for one day. + mild cough No pleuritic pain No wheezing + nasal congestion ? post-nasal drainage No sinus pain/pressure No itchy/red eyes No earache No hemoptysis No SOB + fever/chills + nausea No vomiting No abdominal pain No diarrhea No skin rashes + fatigue + myalgias No headache Used OTC meds without relief   Current Problems: ACUTE PHARYNGITIS (ICD-462) SEIZURE DISORDER (ICD-780.39) CELLULITIS, RIGHT LEG (ICD-682.6) ACUTE CYSTITIS (ICD-595.0) URI (ICD-465.9) CANDIDIASIS, ORAL (ICD-112.0) HOT FLASHES (ICD-627.2) ANXIETY DISORDER, GENERALIZED (ICD-300.02) KNEE PAIN (ICD-719.46) Family Hx of ADRENOLEUKODYSTROPHY (ICD-272.8) SKIN RASH (ICD-782.1) ROUTINE GYNECOLOGICAL EXAMINATION (ICD-V72.31) SEIZURE DISORDER, GENERALIZED (ICD-780.39)   Current Meds BENZONATATE 200 MG CAPS (BENZONATATE) One by mouth hs as needed cough AMOXICILLIN 875 MG TABS (AMOXICILLIN) One by mouth Q12hr.  Rx void after 05/23/10)  REVIEW OF SYSTEMS Constitutional Symptoms      Denies fever, chills, night sweats, weight loss, weight gain, and fatigue.  Eyes       Denies change in vision, eye pain, eye discharge, glasses, contact lenses, and eye surgery. Ear/Nose/Throat/Mouth  Complains of sore throat.      Denies hearing loss/aids, change in hearing, ear pain, ear discharge, dizziness, frequent runny nose, frequent nose bleeds, sinus problems, hoarseness, and tooth pain or bleeding.      Comments: 1 dy Respiratory       Denies dry cough, productive cough, wheezing, shortness of breath, asthma, bronchitis, and emphysema/COPD.  Cardiovascular       Denies murmurs, chest pain, and tires easily with exhertion.    Gastrointestinal       Denies stomach pain, nausea/vomiting, diarrhea, constipation, blood in bowel movements, and indigestion. Genitourniary       Denies painful urination, kidney stones, and loss of urinary control. Neurological       Denies paralysis, seizures, and fainting/blackouts. Musculoskeletal       Denies muscle pain, joint pain, joint stiffness, decreased range of motion, redness, swelling, muscle weakness, and gout.  Skin       Denies bruising, unusual mles/lumps or sores, and hair/skin or nail changes.  Psych       Denies mood changes, temper/anger issues, anxiety/stress, speech problems, depression, and sleep problems.  Past History:  Past Surgical History: Last updated: 08/10/2009 Cholecystectomy 02-27-04 Nephrectomy 02/26/09 for large renal calculus- Dr. Creed Copper.   Family History: Last updated: 06/14/2009 Mother with MI, carrier for Adrenoleukodystrophy, died 02/27/2008 3 brothers deceased from Adrenoleukodystrophy Mother with skin cancer, deceased.   Social History: Last updated: 03/24/2009 Not currently working .  Some college. Engaged. Never Smoked Alcohol use-no Drug use-no Regular exercise-yes, cycling.   Risk Factors: Alcohol Use: 0 (01/26/2009) Diet: good (01/26/2009) Exercise: yes (01/26/2009)  Risk Factors: Smoking Status: never (01/26/2009)  Past Medical History: Frequent UTI's/ kidney stones hx of agoraphobia Seeing [facility] for her anxiety and agoraphobia Seizure disorder   Objective:  Appearance:  Patient appears  healthy, stated age, and in no acute distress  Eyes:  Pupils are equal, round, and reactive to light and accomdation.  Extraocular movement is intact.  Conjunctivae are not inflamed.  Ears:  Canals normal.  Tympanic membranes normal.   Nose:  Normal septum.  Normal turbinates, mildly congested.   No sinus tenderness present.  Pharynx:  Erythematous on right with small amount of exudate Neck:  Supple.  Slightly tender shotty anterior/posterior nodes are palpated bilaterally.  Lungs:  Clear to auscultation.  Breath sounds are equal.  Heart:  Regular rate and rhythm without murmurs, rubs, or gallops.  Abdomen:  Nontender without masses or hepatosplenomegaly.  Bowel sounds are present.  No CVA or flank tenderness.  Skin:  No rash Rapid strep test negative  Assessment New Problems: ACUTE PHARYNGITIS (ICD-462) SEIZURE DISORDER (ICD-780.39)  SUSPECT VIRAL URI  Plan New Medications/Changes: AMOXICILLIN 875 MG TABS (AMOXICILLIN) One by mouth Q12hr.  Rx void after 05/23/10)  #14 x 0, 05/15/2010, Donna Christen MD BENZONATATE 200 MG CAPS (BENZONATATE) One by mouth hs as needed cough  #12 x 0, 05/15/2010, Donna Christen MD  New Orders: Rapid Strep [[id number]] T-Culture, Throat [[id number]] New Patient Level III [99203] Planning Comments:   Throat culture pending.  Treat symptomatically for now:  expectorant/decongestant, cough suppressant at bedtime. Begin amoxicillin if culture positive or not improving 5 to 7 days (given Rx to hold) Follow-up with PCP if not improving.   The patient and/or caregiver has been counseled thoroughly with regard to medications prescribed including dosage, schedule, interactions, rationale for use, and possible side effects and they verbalize understanding.  Diagnoses and expected course of recovery discussed and will return if not improved as expected or if the condition worsens. Patient and/or caregiver verbalized understanding.  Prescriptions: AMOXICILLIN 875 MG TABS  (AMOXICILLIN) One by mouth Q12hr.  Rx void after 05/23/10)  #14 x 0   Entered and Authorized by:   Donna Christen MD   Signed by:   Donna Christen MD on 05/15/2010   Method used:   Print then Give to Patient   RxID:   [id number] BENZONATATE 200 MG CAPS (BENZONATATE) One by mouth hs as needed cough  #12 x 0   Entered and Authorized by:   Donna Christen MD   Signed by:   Donna Christen MD on 05/15/2010   Method used:   Print then Give to Patient   RxID:   [id number]   Patient Instructions: 1)  May use Mucinex D (guaifenesin with decongestant) twice daily for congestion. 2)  Increase fluid intake, rest. 3)  May use Afrin nasal spray (or generic oxymetazoline) twice daily for about 5 days.  Also recommend using saline nasal spray several times daily and/or saline nasal irrigation. 4)  Add amoxicillin if throat culture positive or if not improving 7 to 10 days. 5)  Followup with family doctor if not improving 10 to 14 days  Orders Added: 1)  Rapid Strep [[id number]] 2)  T-Culture, Throat [[id number]] 3)  New Patient Level III [99203]  Laboratory Results  Date/Time Received: May 15, 2010 3:37 PM  Date/Time Reported: May 15, 2010 3:37 PM  Other Tests  Rapid Strep: negative  Kit Test Internal QC: Negative   (Normal Range: Negative)

## 2010-11-01 NOTE — Letter (Signed)
Summary: [facility]  [facility]   Imported By: Lanelle Bal 04/28/2010 10:27:10  _____________________________________________________________________  External Attachment:    Type:   Image     Comment:   External Document

## 2010-11-01 NOTE — Assessment & Plan Note (Signed)
Summary: ANXIETY D/O, Seizure D/O   Vital Signs:  Patient profile:   50 year old female Menstrual status:  regular Height:      63.75 inches Weight:      135 pounds Pulse rate:   52 / minute BP sitting:   113 / 69  (left arm) Cuff size:   regular  Vitals Entered By: Kathlene November (June 14, 2009 3:17 PM) CC: anxiety- having kidney removed next month. Been having small seizures- depressed   Primary Care Provider:  Nani Gasser MD  CC:  anxiety- having kidney removed next month. Been having small seizures- depressed.  History of Present Illness: Has appt with Neurology at the end of the month. Having seizure mostly at night while asleep.  Wouldl wake up and would be disorientated, would shake, didn't usually remember them. Bit her tongue the other night.  When would faint when younger would have siezures. Had blammed her anxiety for this for years. Can sometimes tell if doing to have one. Will feel really "strange" whill notice will drop sometihing for no reason or "zone out".  Worse if sleep deprevation or if eats too much sugar.  Will notice feels sweaty and clammy.   Hasn't worked since 2023/02/20 because of her renal calculi that are causing recurrent infections. She has 2 stens right now that are actively draining. Marland KitchenHas  been very anxious. HAs been afraid to be alone.  had agoraphobia at one time during her life and feels she is mentally heading back in that direction.   Having a hard time. Was on Xanax at one time and caused the oppisite effect (seemed to make panic attacks worse).  Tried valium but caused HA, but did work.  Says she is very sensitve to medications.    Current Medications (verified): 1)  None  Allergies (verified): 1)  ! Codeine 2)  ! Demerol 3)  ! Erythromycin 4)  ! Cipro 5)  ! * Tomatoes 6)  ! * Strawberries  Comments:  Nurse/Medical Assistant: The patient's medications and allergies were reviewed with the patient and were updated in the Medication and  Allergy Lists. Kathlene November (June 14, 2009 3:19 PM)  Past History:  Past Medical History: Frequent UTI's/ kidney stones hx of agoraphobia  Family History: Mother with MI, carrier for Adrenoleukodystrophy, died Feb 20, 2008 3 brothers deceased from Adrenoleukodystrophy Mother with skin cancer, deceased.   Physical Exam  General:  Well-developed,well-nourished,in no acute distress; alert,appropriate and cooperative throughout examination Msk:  Stents are in place and draining well.    Impression & Recommendations:  Problem # 1:  ANXIETY DISORDER, GENERALIZED (ICD-300.02) Assessment New Also has signs of dpression. PHQ-9 score was 23 (severe). Discussed starting a daily medication. Will call her surgeon to see if OK to start an dSSRI. I really don't want to interfere with her surgery but I really do think  she needs to be treated. SHe is really struggling with her own medical problems and with her mother's death from last year. She really would benefit from counseling but right now with her insurance can't afford it.  She is trying ot apply for Medicare/Medicaid.  Her updated medication list for this problem includes:    Clonazepam 0.25 Mg Tbdp (Clonazepam) .Marland Kitchen... Take 1 tablet by mouth once a day as needed  Problem # 2:  SEIZURE DISORDER, GENERALIZED (ICD-780.39) Has appt with neuro at the end of the month.  Her updated medication list for this problem includes:    Clonazepam 0.25 Mg Tbdp (Clonazepam) .Marland KitchenMarland KitchenMarland KitchenMarland Kitchen  Take 1 tablet by mouth once a day as needed  Complete Medication List: 1)  Clonazepam 0.25 Mg Tbdp (Clonazepam) .... Take 1 tablet by mouth once a day as needed Prescriptions: CLONAZEPAM 0.25 MG TBDP (CLONAZEPAM) Take 1 tablet by mouth once a day as needed  #30 x 0   Entered and Authorized by:   Nani Gasser MD   Signed by:   Nani Gasser MD on 06/14/2009   Method used:   Print then Give to Patient   RxID:   [id number]

## 2010-11-01 NOTE — Progress Notes (Signed)
  Phone Note Outgoing Call Call back at [facility] [phone] Call back at Work Phone [phone]   Call placed by: Lajean Saver RN,  May 17, 2010 2:19 PM Call placed to: Patient Summary of Call: Call back attempted to inform patient of negative throat culture. The patient's listed home number led me to a message mail box for a company and her listed work number was not a working number.

## 2010-11-01 NOTE — Assessment & Plan Note (Signed)
Summary: Oral candidiasis, URI, anxiety   Vital Signs:  Patient profile:   50 year old female Menstrual status:  regular Height:      63.75 inches Weight:      136 pounds Pulse rate:   65 / minute BP sitting:   96 / 56  (left arm) Cuff size:   regular  Vitals Entered By: Kathlene November (October 15, 2009 10:58 AM) CC: thrush still in mouth. Discuss coming off the Lamictal   Primary Care Provider:  Nani Gasser MD  CC:  thrush still in mouth. Discuss coming off the Lamictal.  History of Present Illness: thrush still in mouth. Had some mouth wash nystatin left and used some yesterday adn it helped. Discuss coming off the Lamictal.  Has started a protbiotic.  Husband is getting the thrush again.  Thinks they may be spreading it back and forth.    Runny nose and post nasal drip. Thinks may be coming down with something. Has a swollen LN in her neck and feels fatigued. No fever or cough or ST.  Symptoms started 1-2 days ago.   also wants to come off her lamictal because feels it is really not helping her. Stil having pseudo-seizues.   Current Medications (verified): 1)  Lamotrigine 150 Mg Tabs (Lamotrigine) .... Take 1 Tablet By Mouth Once A Day 2)  Citalopram Hydrobromide 20 Mg Tabs (Citalopram Hydrobromide) .... Take 1/2 Tablet By Mouth Once A Day in The Am For 1 Week Then Whole Tab A Day. 3)  Magic Mouth Wash (With Nystatin) .... Swish and Spit 5ml Three Times A Day For 1 Weeks.  Allergies (verified): 1)  ! Codeine 2)  ! Demerol 3)  ! Erythromycin 4)  ! Cipro 5)  ! * Tomatoes 6)  ! * Strawberries  Comments:  Nurse/Medical Assistant: The patient's medications and allergies were reviewed with the patient and were updated in the Medication and Allergy Lists. Kathlene November (October 15, 2009 10:59 AM)  Past History:  Past Medical History: Frequent UTI's/ kidney stones hx of agoraphobia Seeing [facility] for her anxiety and agoraphobia  Physical Exam  General:   Well-developed,well-nourished,in no acute distress; alert,appropriate and cooperative throughout examination Head:  Normocephalic and atraumatic without obvious abnormalities. No apparent alopecia or balding. Eyes:  No corneal or conjunctival inflammation noted. EOMI. Perrla.  Ears:  External ear exam shows no significant lesions or deformities.  Otoscopic examination reveals clear canals, tympanic membranes are intact bilaterally without bulging, retraction, inflammation or discharge. Hearing is grossly normal bilaterally. Nose:  External nasal examination shows no deformity or inflammation. Nasal mucosa are pink and moist without lesions or exudates. Mouth:  Still some pathcy white lesion on her tongue.  Neck:  No deformities, masses, or tenderness noted. Lungs:  Normal respiratory effort, chest expands symmetrically. Lungs are clear to auscultation, no crackles or wheezes. Heart:  Normal rate and regular rhythm. S1 and S2 normal without gallop, murmur, click, rub or other extra sounds. Pulses:  Radial 2+  Skin:  no rashes.   Cervical Nodes:  small right ant cerv LN.  Psych:  Cognition and judgment appear intact. Alert and cooperative with normal attention span and concentration. No apparent delusions, illusions, hallucinations   Impression & Recommendations:  Problem # 1:  CANDIDIASIS, ORAL (ICD-112.0) will treat with oral diflcuan. Can treat husband at same time so they don't pass it back adn forth.    Problem # 2:  ANXIETY DISORDER, GENERALIZED (ICD-300.02) Discussed really needs to discuss stopping the lamictal  either wtih Neurology (prescriber) or her psychiatrist at [facility] since really using it for psych reasons. They want to start zoloft but she hasn't filled the med yet as was nervous to take it with the lamictal.    Her updated medication list for this problem includes:    Citalopram Hydrobromide 20 Mg Tabs (Citalopram hydrobromide) .Marland Kitchen... Take 1/2 tablet by mouth once a day in the  am for 1 week then whole tab a day.  Problem # 3:  URI (ICD-465.9) Likely early viral illness. She was worried about flu but reassured her this is not the flu.  Instructed on symptomatic treatment. Call if symptoms persist or worsen.   Complete Medication List: 1)  Lamotrigine 150 Mg Tabs (Lamotrigine) .... Take 1 tablet by mouth once a day 2)  Citalopram Hydrobromide 20 Mg Tabs (Citalopram hydrobromide) .... Take 1/2 tablet by mouth once a day in the am for 1 week then whole tab a day. 3)  Magic Mouth Wash (with Nystatin)  .... Swish and spit 5ml three times a day for 1 weeks. 4)  Fluconazole 150 Mg Tabs (Fluconazole) .... Take 1 tablet by mouth once a day for 2 days. can repeat if needed. Prescriptions: FLUCONAZOLE 150 MG TABS (FLUCONAZOLE) Take 1 tablet by mouth once a day for 2 days. Can repeat if needed.  #4 x 0   Entered and Authorized by:   Nani Gasser MD   Signed by:   Nani Gasser MD on 10/15/2009   Method used:   Electronically to        [facility] # [phone]* (retail)       [address] [place], [place]  [address]       Ph: [phone]       Fax: [phone]   RxID:   [id number]

## 2010-11-01 NOTE — Assessment & Plan Note (Signed)
Summary: STUNG BY A YELLOW JACKET X2 DAYS AGO//VGJ   Vital Signs:  Patient profile:   50 year old female Menstrual status:  regular Height:      63.75 inches Weight:      133 pounds BMI:     23.09 O2 Sat:      99 % on Room air Pulse rate:   58 / minute BP sitting:   103 / 61  (left arm) Cuff size:   regular  Vitals Entered By: Payton Spark CMA (March 18, 2010 1:42 PM)  O2 Flow:  Room air CC: Stung by bee x 2 days ago on R knee.    Primary Care Provider:  Nani Gasser MD  CC:  Stung by bee x 2 days ago on R knee. Marland Kitchen  History of Present Illness: 50 yo woman here today for bee sting.  occurred 2 days ago.  area on R lateral knee is red, much less painful than previous.  this AM was stiff but this is improving as the day goes on.  actual area of sting now w/ white center.  redness is spreading away from sting.  skin is 'hot, it has a fever'.  Current Medications (verified): 1)  Clonazepam 0.25 Mg Tbdp (Clonazepam) .... Take 1 Tab By Mouth Two Times A Day 2)  Zoloft 25 Mg Tabs (Sertraline Hcl) .... Take 1 Tab By Mouth Once Daily  Allergies (verified): 1)  ! Codeine 2)  ! Demerol 3)  ! Erythromycin 4)  ! Cipro 5)  ! * Tomatoes 6)  ! * Strawberries  Review of Systems      See HPI  Physical Exam  General:  alert, well-developed, well-nourished, and well-hydrated.   Extremities:  small area w/ visible pus on R lateral knee, surrounding erythema and warmth.  no fluctuance or pain.   Impression & Recommendations:  Problem # 1:  CELLULITIS, RIGHT LEG (ICD-682.6) Assessment New pt w/ cellulitis of R lateral knee.  no evidence of septic joint.  start keflex.  reviewed supportive care and red flags that should prompt return.  Pt expresses understanding and is in agreement w/ this plan. The following medications were removed from the medication list:    Cephalexin 500 Mg Caps (Cephalexin) .Marland Kitchen... 1 capsule by mouth three times a day x7 days Her updated medication list for  this problem includes:    Cephalexin 500 Mg Tabs (Cephalexin) .Marland Kitchen... Take one by mouth two times a day x7days  Complete Medication List: 1)  Clonazepam 0.25 Mg Tbdp (Clonazepam) .... Take 1 tab by mouth two times a day 2)  Zoloft 25 Mg Tabs (Sertraline hcl) .... Take 1 tab by mouth once daily 3)  Cephalexin 500 Mg Tabs (Cephalexin) .... Take one by mouth two times a day x7days 4)  Diflucan 150 Mg Tabs (Fluconazole) .... Once daily for yeast symptoms.  repeat in 72 hrs if sxs persist  Patient Instructions: 1)  Ice the area for pain and swelling 2)  Benadryl for itching as needed 3)  Take the Keflex as directed- take w/ food to avoid upset stomach 4)  Take the diflucan as needed for yeast sxs 5)  If the redness continues to spread, please call 6)  Hang in there! Prescriptions: DIFLUCAN 150 MG TABS (FLUCONAZOLE) once daily for yeast symptoms.  repeat in 72 hrs if sxs persist  #2 x 0   Entered and Authorized by:   Neena Rhymes MD   Signed by:   Neena Rhymes MD on  03/18/2010   Method used:   Electronically to        [facility] # [phone]* (retail)       [address] [place], [place]  [address]       Ph: [phone]       Fax: [phone]   RxID:   [id number] CEPHALEXIN 500 MG  TABS (CEPHALEXIN) take one by mouth two times a day x7days  #14 x 0   Entered and Authorized by:   Neena Rhymes MD   Signed by:   Neena Rhymes MD on 03/18/2010   Method used:   Electronically to        [facility] # [phone]* (retail)       [address] [place], [place]  [address]       Ph: [phone]       Fax: [phone]   RxID:   [id number]

## 2010-11-01 NOTE — Assessment & Plan Note (Signed)
Summary: UTI   Vital Signs:  Patient profile:   50 year old female Menstrual status:  regular Height:      63.75 inches Weight:      134 pounds BMI:     23.27 O2 Sat:      100 % on Room air Temp:     97.9 degrees F oral Pulse rate:   52 / minute BP sitting:   125 / 76  (left arm) Cuff size:   regular  Vitals Entered By: Payton Spark CMA (January 26, 2010 1:41 PM)  O2 Flow:  Room air CC: ? UTI x 2 hours. Nephrectomy 09/10.   Primary Care Provider:  Nani Gasser MD  CC:  ? UTI x 2 hours. Nephrectomy 09/10.Autumn Garcia  History of Present Illness: 50 yo WF presents for 2 hrs of dysuria and urgency.  Denies gross hematuria.  Her last period was 1.5 mos ago.  She had a kidney removed last year.   This was removed for a staghorn calculi.  She has been drinking lots of water.  She has some pelvic pressure.  She is taking iron and colace also.  She is only having R flank pain after her nephrectomy.  Denies fevers or chills.  Has some nausea.    Current Medications (verified): 1)  Clonazepam 0.25 Mg Tbdp (Clonazepam) .... Take 1 Tab By Mouth Two Times A Day 2)  Zoloft 25 Mg Tabs (Sertraline Hcl) .... Take 1 Tab By Mouth Once Daily  Allergies (verified): 1)  ! Codeine 2)  ! Demerol 3)  ! Erythromycin 4)  ! Cipro 5)  ! * Tomatoes 6)  ! * Strawberries  Past History:  Past Medical History: Reviewed history from 10/15/2009 and no changes required. Frequent UTI's/ kidney stones hx of agoraphobia [facility] for her anxiety and agoraphobia  Past Surgical History: Reviewed history from 08/10/2009 and no changes required. Cholecystectomy 2005 Nephrectomy 2010 for large renal calculus- Dr. Creed Copper.   Social History: Reviewed history from 03/24/2009 and no changes required. Not currently working . Some college. Engaged. Never Smoked Alcohol use-no Drug use-no Regular exercise-yes, cycling.   Review of Systems      See HPI  Physical Exam  General:  alert, well-developed,  well-nourished, and well-hydrated.   Head:  normocephalic and atraumatic.  wearing a bandage on the R side of her face Eyes:  sclera non icteric Mouth:  pharynx pink and moist.   Lungs:  Normal respiratory effort, chest expands symmetrically. Lungs are clear to auscultation, no crackles or wheezes. Heart:  Normal rate and regular rhythm. S1 and S2 normal without gallop, murmur, click, rub or other extra sounds. Abdomen:  no CVAT or suprapubic TTP without guarding Extremities:  no E/C/C Skin:  color normal.     Impression & Recommendations:  Problem # 1:  ACUTE CYSTITIS (ICD-595.0) UA + for leukocytes and blood with solitary kidney after nephrectomy for staghorn calculus in 2010.  Will empirically treat iwth Keflex and f/u cx results in 48 hrs.  Encouarged clear fluids, rest.  Check BMP for BUN/Cr today due to her complicated hx. Her updated medication list for this problem includes:    Cephalexin 500 Mg Caps (Cephalexin) .Autumn Garcia... 1 capsule by mouth three times a day x7 days  Orders: UA Dipstick w/o Micro (automated)  (81003) T-Culture, Urine ([id number]) T-Basic Metabolic Panel ([id number])  Encouraged to push clear liquids, get enough rest, and take acetaminophen as needed. To be seen in 10 days if no improvement, sooner if  worse.  Complete Medication List: 1)  Clonazepam 0.25 Mg Tbdp (Clonazepam) .... Take 1 tab by mouth two times a day 2)  Zoloft 25 Mg Tabs (Sertraline hcl) .... Take 1 tab by mouth once daily 3)  Cephalexin 500 Mg Caps (Cephalexin) .Autumn Garcia.. 1 capsule by mouth three times a day x7 days  Patient Instructions: 1)  Urine sent for culture. 2)  Will call you with results on Friday. 3)  Start on Cephalexin today. 4)  BMP today. 5)  Will call you w/ results on Friday. 6)  Drink plenty of water. Prescriptions: CEPHALEXIN 500 MG CAPS (CEPHALEXIN) 1 capsule by mouth three times a day x7 days  #21 x 0   Entered and Authorized by:   Seymour Bars DO   Signed by:   Seymour Bars  DO on 01/26/2010   Method used:   Electronically to        [facility] # [id number]* (retail)       [address] [place], [place]  [address]       Ph: [phone]       Fax: [phone]   RxID:   [id number]   Laboratory Results   Urine Tests    Routine Urinalysis   Color: yellow/green Appearance: Clear Glucose: negative   (Normal Range: Negative) Bilirubin: negative   (Normal Range: Negative) Ketone: negative   (Normal Range: Negative) Spec. Gravity: <1.005   (Normal Range: 1.003-1.035) Blood: moderate   (Normal Range: Negative) pH: 6.5   (Normal Range: 5.0-8.0) Protein: negative   (Normal Range: Negative) Urobilinogen: 0.2   (Normal Range: 0-1) Nitrite: negative   (Normal Range: Negative) Leukocyte Esterace: moderate   (Normal Range: Negative)

## 2010-11-01 NOTE — Progress Notes (Signed)
Summary: need med called in  Phone Note Call from Patient   Caller: Patient Summary of Call: Dr.Metheney   Call Back  [phone]  Patient said her boyfriend-Norman Winterhalter has thrush and she said she has it now bc she has white sores in her mouth.   Pt would like you to Call in [facility].     [facility]  Please advise.  Initial call taken by: Vanessa Swazil[place],  September 10, 2009 11:45 AM  Follow-up for Phone Call        [facility] appt.  Follow-up by: Nani Gasser MD,  September 10, 2009 12:16 PM    New/Updated Medications: * MAGIC MOUTH WASH (WITH NYSTATIN) Swish and spit 5ml three times a day for 1 weeks. Prescriptions: MAGIC MOUTH WASH (WITH NYSTATIN) Swish and spit 5ml three times a day for 1 weeks.  #QS x 0   Entered and Authorized by:   Nani Gasser MD   Signed by:   Nani Gasser MD on 09/10/2009   Method used:   Printed then faxed to ...       [facility] [address] # [phone]* (retail)       [address] [place], [place]  [address]       Ph: [phone]       Fax: [phone]   RxID:   [id number]

## 2010-11-08 ENCOUNTER — Encounter (HOSPITAL_COMMUNITY): Payer: Self-pay | Admitting: Licensed Clinical Social Worker

## 2010-11-10 ENCOUNTER — Encounter (INDEPENDENT_AMBULATORY_CARE_PROVIDER_SITE_OTHER): Payer: Medicaid Other | Admitting: Licensed Clinical Social Worker

## 2010-11-10 DIAGNOSIS — F4310 Post-traumatic stress disorder, unspecified: Secondary | ICD-10-CM

## 2010-11-16 ENCOUNTER — Encounter (INDEPENDENT_AMBULATORY_CARE_PROVIDER_SITE_OTHER): Payer: Medicaid Other | Admitting: Licensed Clinical Social Worker

## 2010-11-16 DIAGNOSIS — F4310 Post-traumatic stress disorder, unspecified: Secondary | ICD-10-CM

## 2010-11-23 ENCOUNTER — Encounter (INDEPENDENT_AMBULATORY_CARE_PROVIDER_SITE_OTHER): Payer: Medicaid Other | Admitting: Licensed Clinical Social Worker

## 2010-11-23 DIAGNOSIS — F4310 Post-traumatic stress disorder, unspecified: Secondary | ICD-10-CM

## 2010-11-30 ENCOUNTER — Encounter (INDEPENDENT_AMBULATORY_CARE_PROVIDER_SITE_OTHER): Payer: Medicaid Other | Admitting: Licensed Clinical Social Worker

## 2010-11-30 DIAGNOSIS — F4310 Post-traumatic stress disorder, unspecified: Secondary | ICD-10-CM

## 2010-12-07 ENCOUNTER — Encounter (INDEPENDENT_AMBULATORY_CARE_PROVIDER_SITE_OTHER): Payer: Medicaid Other | Admitting: Licensed Clinical Social Worker

## 2010-12-07 DIAGNOSIS — F4310 Post-traumatic stress disorder, unspecified: Secondary | ICD-10-CM

## 2010-12-14 ENCOUNTER — Encounter (HOSPITAL_COMMUNITY): Payer: Medicaid Other | Admitting: Licensed Clinical Social Worker

## 2010-12-21 ENCOUNTER — Encounter (INDEPENDENT_AMBULATORY_CARE_PROVIDER_SITE_OTHER): Payer: Medicaid Other | Admitting: Licensed Clinical Social Worker

## 2010-12-21 DIAGNOSIS — F4310 Post-traumatic stress disorder, unspecified: Secondary | ICD-10-CM

## 2010-12-28 ENCOUNTER — Encounter (HOSPITAL_COMMUNITY): Payer: Medicaid Other | Admitting: Licensed Clinical Social Worker

## 2010-12-28 ENCOUNTER — Encounter (INDEPENDENT_AMBULATORY_CARE_PROVIDER_SITE_OTHER): Payer: Medicaid Other | Admitting: Licensed Clinical Social Worker

## 2010-12-28 DIAGNOSIS — F4310 Post-traumatic stress disorder, unspecified: Secondary | ICD-10-CM

## 2011-01-04 ENCOUNTER — Encounter (INDEPENDENT_AMBULATORY_CARE_PROVIDER_SITE_OTHER): Payer: Medicaid Other | Admitting: Licensed Clinical Social Worker

## 2011-01-04 DIAGNOSIS — F4310 Post-traumatic stress disorder, unspecified: Secondary | ICD-10-CM

## 2011-01-09 LAB — BASIC METABOLIC PANEL
BUN: 3 mg/dL — ABNORMAL LOW (ref 6–23)
BUN: 3 mg/dL — ABNORMAL LOW (ref 6–23)
BUN: 3 mg/dL — ABNORMAL LOW (ref 6–23)
BUN: 4 mg/dL — ABNORMAL LOW (ref 6–23)
BUN: 8 mg/dL (ref 6–23)
BUN: 15 mg/dL (ref 6–23)
CO2: 21 mEq/L (ref 19–32)
CO2: 22 mEq/L (ref 19–32)
CO2: 23 mEq/L (ref 19–32)
CO2: 24 mEq/L (ref 19–32)
CO2: 28 mEq/L (ref 19–32)
CO2: 25 mEq/L (ref 19–32)
Calcium: 8.1 mg/dL — ABNORMAL LOW (ref 8.4–10.5)
Calcium: 8.1 mg/dL — ABNORMAL LOW (ref 8.4–10.5)
Calcium: 8.2 mg/dL — ABNORMAL LOW (ref 8.4–10.5)
Calcium: 8.5 mg/dL (ref 8.4–10.5)
Calcium: 8.6 mg/dL (ref 8.4–10.5)
Calcium: 9.6 mg/dL (ref 8.4–10.5)
Chloride: 108 mEq/L (ref 96–112)
Chloride: 108 mEq/L (ref 96–112)
Chloride: 109 mEq/L (ref 96–112)
Chloride: 111 mEq/L (ref 96–112)
Chloride: 111 mEq/L (ref 96–112)
Chloride: 107 mEq/L (ref 96–112)
Creatinine, Ser: 0.52 mg/dL (ref 0.4–1.2)
Creatinine, Ser: 0.53 mg/dL (ref 0.4–1.2)
Creatinine, Ser: 0.55 mg/dL (ref 0.4–1.2)
Creatinine, Ser: 0.56 mg/dL (ref 0.4–1.2)
Creatinine, Ser: 0.59 mg/dL (ref 0.4–1.2)
Creatinine, Ser: 0.8 mg/dL (ref 0.4–1.2)
GFR calc Af Amer: >60 mL/min (ref >60)
GFR calc Af Amer: >60 mL/min (ref >60)
GFR calc Af Amer: >60 mL/min (ref >60)
GFR calc Af Amer: >60 mL/min (ref >60)
GFR calc Af Amer: >60 mL/min (ref >60)
GFR calc Af Amer: >60 mL/min (ref >60)
GFR calc non Af Amer: >60 mL/min (ref >60)
GFR calc non Af Amer: >60 mL/min (ref >60)
GFR calc non Af Amer: >60 mL/min (ref >60)
GFR calc non Af Amer: >60 mL/min (ref >60)
GFR calc non Af Amer: >60 mL/min (ref >60)
GFR calc non Af Amer: >60 mL/min (ref >60)
Glucose, Bld: 102 mg/dL — ABNORMAL HIGH (ref 70–99)
Glucose, Bld: 111 mg/dL — ABNORMAL HIGH (ref 70–99)
Glucose, Bld: 122 mg/dL — ABNORMAL HIGH (ref 70–99)
Glucose, Bld: 155 mg/dL — ABNORMAL HIGH (ref 70–99)
Glucose, Bld: 94 mg/dL (ref 70–99)
Glucose, Bld: 106 mg/dL — ABNORMAL HIGH (ref 70–99)
Potassium: 3.2 mEq/L — ABNORMAL LOW (ref 3.5–5.1)
Potassium: 3.4 mEq/L — ABNORMAL LOW (ref 3.5–5.1)
Potassium: 3.5 mEq/L (ref 3.5–5.1)
Potassium: 3.6 mEq/L (ref 3.5–5.1)
Potassium: 3.6 mEq/L (ref 3.5–5.1)
Potassium: 4 mEq/L (ref 3.5–5.1)
Sodium: 136 mEq/L (ref 135–145)
Sodium: 138 mEq/L (ref 135–145)
Sodium: 140 mEq/L (ref 135–145)
Sodium: 141 mEq/L (ref 135–145)
Sodium: 145 mEq/L (ref 135–145)
Sodium: 143 mEq/L (ref 135–145)

## 2011-01-09 LAB — ANAEROBIC CULTURE

## 2011-01-09 LAB — FOLATE: Folate: >20 ng/mL

## 2011-01-09 LAB — CBC
HCT: 28.8 % — ABNORMAL LOW (ref 36.0–46.0)
HCT: 29.1 % — ABNORMAL LOW (ref 36.0–46.0)
HCT: 30.3 % — ABNORMAL LOW (ref 36.0–46.0)
HCT: 30.7 % — ABNORMAL LOW (ref 36.0–46.0)
HCT: 31.9 % — ABNORMAL LOW (ref 36.0–46.0)
HCT: 37.9 % (ref 36.0–46.0)
Hemoglobin: 10.2 g/dL — ABNORMAL LOW (ref 12.0–15.0)
Hemoglobin: 10.3 g/dL — ABNORMAL LOW (ref 12.0–15.0)
Hemoglobin: 10.8 g/dL — ABNORMAL LOW (ref 12.0–15.0)
Hemoglobin: 9.6 g/dL — ABNORMAL LOW (ref 12.0–15.0)
Hemoglobin: 9.7 g/dL — ABNORMAL LOW (ref 12.0–15.0)
Hemoglobin: 12.1 g/dL (ref 12.0–15.0)
MCHC: 33.1 g/dL (ref 30.0–36.0)
MCHC: 33.3 g/dL (ref 30.0–36.0)
MCHC: 33.3 g/dL (ref 30.0–36.0)
MCHC: 33.7 g/dL (ref 30.0–36.0)
MCHC: 33.9 g/dL (ref 30.0–36.0)
MCHC: 31.8 g/dL (ref 30.0–36.0)
MCV: 86.2 fL (ref 78.0–100.0)
MCV: 86.6 fL (ref 78.0–100.0)
MCV: 86.9 fL (ref 78.0–100.0)
MCV: 87 fL (ref 78.0–100.0)
MCV: 88.4 fL (ref 78.0–100.0)
MCV: 87.9 fL (ref 78.0–100.0)
Platelets: 275 10*3/uL (ref 150–400)
Platelets: 300 10*3/uL (ref 150–400)
Platelets: 312 10*3/uL (ref 150–400)
Platelets: 318 10*3/uL (ref 150–400)
Platelets: 372 10*3/uL (ref 150–400)
Platelets: 385 10*3/uL (ref 150–400)
RBC: 3.33 MIL/uL — ABNORMAL LOW (ref 3.87–5.11)
RBC: 3.35 MIL/uL — ABNORMAL LOW (ref 3.87–5.11)
RBC: 3.48 MIL/uL — ABNORMAL LOW (ref 3.87–5.11)
RBC: 3.51 MIL/uL — ABNORMAL LOW (ref 3.87–5.11)
RBC: 3.67 MIL/uL — ABNORMAL LOW (ref 3.87–5.11)
RBC: 4.31 MIL/uL (ref 3.87–5.11)
RDW: 12.7 % (ref 11.5–15.5)
RDW: 12.9 % (ref 11.5–15.5)
RDW: 12.9 % (ref 11.5–15.5)
RDW: 13.1 % (ref 11.5–15.5)
RDW: 13.3 % (ref 11.5–15.5)
RDW: 12.4 % (ref 11.5–15.5)
WBC: 12.2 10*3/uL — ABNORMAL HIGH (ref 4.0–10.5)
WBC: 13.8 10*3/uL — ABNORMAL HIGH (ref 4.0–10.5)
WBC: 6.2 10*3/uL (ref 4.0–10.5)
WBC: 7 10*3/uL (ref 4.0–10.5)
WBC: 9.5 10*3/uL (ref 4.0–10.5)
WBC: 14.3 10*3/uL — ABNORMAL HIGH (ref 4.0–10.5)

## 2011-01-09 LAB — URINALYSIS, ROUTINE W REFLEX MICROSCOPIC
Bilirubin Urine: NEGATIVE
Glucose, UA: NEGATIVE mg/dL
Ketones, ur: NEGATIVE mg/dL
Nitrite: POSITIVE — AB
Protein, ur: 100 mg/dL — AB
Specific Gravity, Urine: 1.019 (ref 1.005–1.030)
Urobilinogen, UA: 0.2 mg/dL (ref 0.0–1.0)
pH: 6 (ref 5.0–8.0)

## 2011-01-09 LAB — URINE CULTURE: Colony Count: 50000

## 2011-01-09 LAB — URINE MICROSCOPIC-ADD ON

## 2011-01-09 LAB — DIFFERENTIAL
Basophils Absolute: 0 10*3/uL (ref 0.0–0.1)
Basophils Relative: 0 % (ref 0–1)
Eosinophils Absolute: 0.1 10*3/uL (ref 0.0–0.7)
Eosinophils Relative: 1 % (ref 0–5)
Lymphocytes Relative: 10 % — ABNORMAL LOW (ref 12–46)
Lymphs Abs: 1.4 10*3/uL (ref 0.7–4.0)
Monocytes Absolute: 1 10*3/uL (ref 0.1–1.0)
Monocytes Relative: 7 % (ref 3–12)
Neutro Abs: 11.8 10*3/uL — ABNORMAL HIGH (ref 1.7–7.7)
Neutrophils Relative %: 82 % — ABNORMAL HIGH (ref 43–77)

## 2011-01-09 LAB — CULTURE, ROUTINE-ABSCESS: Culture: NO GROWTH

## 2011-01-09 LAB — FERRITIN: Ferritin: 119 ng/mL (ref 10–291)

## 2011-01-09 LAB — RETICULOCYTES
RBC.: 3.54 MIL/uL — ABNORMAL LOW (ref 3.87–5.11)
Retic Count, Absolute: 24.8 10*3/uL (ref 19.0–186.0)
Retic Ct Pct: 0.7 % (ref 0.4–3.1)

## 2011-01-09 LAB — IRON AND TIBC
Iron: 21 ug/dL — ABNORMAL LOW (ref 42–135)
Saturation Ratios: 11 % — ABNORMAL LOW (ref 20–55)
TIBC: 199 ug/dL — ABNORMAL LOW (ref 250–470)
UIBC: 178 ug/dL

## 2011-01-09 LAB — PREGNANCY, URINE: Preg Test, Ur: NEGATIVE

## 2011-01-09 LAB — PROTIME-INR
INR: 1.3 (ref 0.00–1.49)
Prothrombin Time: 16.7 seconds — ABNORMAL HIGH (ref 11.6–15.2)

## 2011-01-09 LAB — APTT: aPTT: 36 seconds (ref 24–37)

## 2011-01-09 LAB — VITAMIN B12: Vitamin B-12: 696 pg/mL (ref 211–911)

## 2011-01-11 ENCOUNTER — Encounter (INDEPENDENT_AMBULATORY_CARE_PROVIDER_SITE_OTHER): Payer: Medicaid Other | Admitting: Licensed Clinical Social Worker

## 2011-01-11 DIAGNOSIS — F4310 Post-traumatic stress disorder, unspecified: Secondary | ICD-10-CM

## 2011-01-18 ENCOUNTER — Encounter (HOSPITAL_COMMUNITY): Payer: Medicaid Other | Admitting: Licensed Clinical Social Worker

## 2011-01-26 ENCOUNTER — Encounter (INDEPENDENT_AMBULATORY_CARE_PROVIDER_SITE_OTHER): Payer: Medicaid Other | Admitting: Licensed Clinical Social Worker

## 2011-01-26 DIAGNOSIS — F4310 Post-traumatic stress disorder, unspecified: Secondary | ICD-10-CM

## 2011-02-02 ENCOUNTER — Encounter (HOSPITAL_COMMUNITY): Payer: Medicaid Other | Admitting: Licensed Clinical Social Worker

## 2011-02-09 ENCOUNTER — Encounter (INDEPENDENT_AMBULATORY_CARE_PROVIDER_SITE_OTHER): Payer: Medicaid Other | Admitting: Licensed Clinical Social Worker

## 2011-02-09 DIAGNOSIS — F4310 Post-traumatic stress disorder, unspecified: Secondary | ICD-10-CM

## 2011-02-14 NOTE — Discharge Summary (Signed)
Autumn Garcia, Autumn Garcia            ACCOUNT NO.:  [id number]   MEDICAL RECORD NO.:  [id number]          PATIENT TYPE:  INP   LOCATION:  5531                         FACILITY:  [facility]   PHYSICIAN:  Pearlean Brownie, M.D.DATE OF BIRTH:  1961-05-08   DATE OF ADMISSION:  03/24/2009  DATE OF DISCHARGE:  03/29/2009                               DISCHARGE SUMMARY   PRIMARY CARE Jordyn Hofacker:  Nani Gasser, MD   DISCHARGE DIAGNOSIS:  Pyelonephritis with staghorn calculus.   DISCHARGE MEDICATION:  Trimethoprim 100 mg twice a day for 7 days and  then nightly thereafter until seen by Urology.   CONSULTANTS:  1. Interventional Radiology.  2. [facility].   PROCEDURES:  1. CT abdomen without contrast, March 24, 2009:  Large right staghorn      calculus extending into the right renal pelvis.  Right kidney is      enlarged, measuring 12 cm in craniocaudal length.  Low-attenuation      lesions in the right kidney, possibly representing cyst.      Xanthogranulomatous pyelonephritis is a consideration.  Adenopathy      is also noted.  Differential includes infection or malignancy.      Left kidney contained 2- to 3-mm calcifications, no ureteral      calculi identified on the left.  The patient has had a      cholecystectomy.  2. CT abdomen with contrast, March 25, 2009:  Focal abscess in the      interpolar region is suspected associated with fragmented staghorn      calculus on the right.  3. Ultrasound-guided percutaneous nephrostomy placement on March 26, 2009, 50 mL of exudative fluid removed.   LABORATORY STUDIES:  1. Abscess culture.  The patient is on Rocephin, no organisms.  2. Basic metabolic panel:  On discharge, basic metabolic panel normal      with creatinine of 0.53.  3. CBC:  On admission, white blood count was 14.3, hemoglobin 12.1,      neutrophils 82%.  On discharge, CBC showed a white blood count of      6.2, hemoglobin 10.3, platelets 372.  4. Anemia panel:   Reticulocytes 0.7%, iron 21 (low ), TIBC 199 (low),      percent saturation 11 (low), vitamin B12 696, serum folate greater      than 20, ferritin 119.  5. Urine culture:  50,000 colonies of Klebsiella pneumoniae, resistant      to ampicillin but sensitive to cephazolin, ceftriaxone,      ciprofloxacin, gentamicin, levofloxacin, trimethoprim, sulfa.   BRIEF HOSPITAL COURSE:  This is a 50 year old female with a past medical  history significant for frequent urinary tract infections and renal  calculi, who upon presentation to [facility] for right  flank pain, was found on CT imaging to have a large staghorn calculus.  The patient was admitted for further workup.  Urology was consulted and  felt that the patient would benefit from percutaneous drainage.  The  patient tolerated the procedure well.  By day of discharge, the patient  had a normal white  count, had been afebrile for greater than 48 hours.  She continued to have drainage and so it was decided to leave the  nephrostomy tube in place until followup.  She will likely need  definitive percutaneous nephrolithotomy once the infection has resolved.  The patient will be sent home on antibiotics until seen in followup.   DISCHARGE INSTRUCTIONS:  The patient was asked to record the drainage of  the abscess drain JP's bulb (every day).  When the output is less than  10 mL in 24 hours, she was asked to let her primary care physician, Dr.  Linford Arnold, know.  The patient was also asked to take the output records  to any appointment with her.  Once the output is less than 10 mL in 24  hours, the patient will need a repeat CT scan to see if the drains can  be pulled.   FOLLOWUP APPOINTMENTS:  The patient is asked to schedule a followup  appointment with Dr. Linford Arnold in 7-10 days.  The patient is instructed  that [facility], Dr. Excell Seltzer.  Annabell Howells, will call with the date of  definitive procedure and followup appointment.  The  patient is  discharged home in stable condition.      Delbert Harness, MD  Electronically Signed      Pearlean Brownie, M.D.  Electronically Signed    KB/MEDQ  D:  03/30/2009  T:  03/31/2009  Job:  [id number]   cc:   Excell Seltzer. Annabell Howells, M.D.  Judie Petit. [facility]  Nani Gasser, M.D.

## 2011-02-14 NOTE — H&P (Signed)
Autumn Garcia, Autumn Garcia            ACCOUNT NO.:  [id number]   MEDICAL RECORD NO.:  [id number]          PATIENT TYPE:  INP   LOCATION:  5531                         FACILITY:  [facility]   PHYSICIAN:  Paula Compton, MD        DATE OF BIRTH:  12/13/1960   DATE OF ADMISSION:  03/24/2009  DATE OF DISCHARGE:                              HISTORY & PHYSICAL   PRIMARY CARE Maureena Dabbs:  Nani Gasser, MD   CHIEF COMPLAINT:  Pyelonephritis.   HISTORY OF PRESENT ILLNESS:  The patient is also known as Autumn  Garcia at her PCP's office.   HISTORY OF PRESENT ILLNESS:  This is a 50 year old female with past  medical history significant for frequent urinary tract infections, who  presents to [facility] for 1-day history of right flank  pain.  The patient states that the day prior to admission, she felt in  acute onset of dull pain in her right flank.  Overnight, she had  intermittent pain that helped with Motrin.  She had mild nausea, low  grade fever of 99 degrees Fahrenheit, poor appetite which was associated  with this episode.  The patient decided to come in for further workup  due to continued pain.   The patient said that she had urinary frequency, urgency, and dysuria 3-  4 days ago, which had resolved prior to this painful episode.  She takes  the Uriflow herbal supplement for her history of frequent urinary tract  infection.  She states that she has had many kidney stones for the past  30 years with resolution of each episode with antibiotics.  She recalls  as many of 5-6 urinary tract infections in the past year.  She was  treated with amoxicillin for right otitis media on March 03, 2009.  In the  emergency room, the patient received IV fluids and 1 dose of Rocephin.   PAST MEDICAL HISTORY:  1. History of frequent urinary tract infections.  2. Family history of adrenoleukodystrophy.  3. Questionable seizure during a dental procedure.   PAST SURGICAL HISTORY:   Cholecystectomy in 2005.   FAMILY HISTORY:  The patient's mother had an MI and was a carrier for  adrenoleukodystrophy.  She has 3 brothers, who were deceased from  adrenoleukodystrophy .  Her mother also had skin cancer.   SOCIAL HISTORY:  The patient is not currently working.  Her highest  level of education completed with some college.  She is currently  engaged.  She has never smoked.  Denies alcohol or drug use.  She  exercises regularly.   MEDICATIONS:  1. Uriflow herbal supplement 1 tablet daily.  2. Amoxicillin 875 mg tablets prescribed for otitis media on March 03, 2009.   REVIEW OF SYSTEMS:  Positive as per HPI for fever, nausea, dysuria,  urinary frequency, and mid back pain.  Negative for weight loss, chest  pain, lightheadedness, shortness of breath, abdominal pain, diarrhea,  vomiting, and rash.   PHYSICAL EXAMINATION:  VITAL SIGNS:  Temperature 98.5, blood pressure  107/67, pulse 67, respirations 18 per minute, O2 sat  100% on room air.  GENERAL:  Well developed, well nourished in no acute distress, alert,  appropriate, and cooperative with examination.  HEAD:  Normocephalic, atraumatic.  EYES:  Extraocular movements are intact.  PERRLA.  ORAL MUCOSA and OROPHARYNX:  Without lesions or exudates.  NECK:  No deformities, masses, or tenderness.  LUNGS:  Normal respiratory effort.  Lungs clear to auscultation.  No  crackles or wheezes.  HEART:  Normal.  Regular rate and rhythm.  No murmurs.  ABDOMEN:  Positive bowel sounds.  Abdomen is soft, mildly tender on the  right side without masses, organomegaly or hernias.  PULSES:  The right and left dorsalis pedis and posterior tibial pulses.  EXTREMITIES::  No clubbing, cyanosis, or edema.  NEUROLOGIC:  Alert and oriented x3.  Cranial nerves II-XII grossly  intact.   LABORATORY STUDIES:  1. Basic metabolic panel.  2. CBC showed a white blood count of 14.3 with 82% PMN, hemoglobin      12.1, and platelets 385.  3.  Urine pregnancy test negative.  4. Urinalysis; specific gravity 1.019, moderate blood, 100 protein,      positive nitrite, positive for esterase.  Urine microscopy shows      rare epithelial cells, too numerous to count white blood cells, 10-      12 rbc's and many bacteria.   IMAGING:  CT of the abdomen and pelvis shows an enlarged right kidney at  12-cm, cranial with a caudal with a large staghorn calculus extending  into the renal pelvis.  Multiple low-density lesions at the kidney, 100%  cyst, abscesses, low density solid masses, or chronic infections such as  xanthogranulomatous pyelonephritis.  Left kidney has scattered  calcifications, but no renal calculi.  Retroperitoneal adenopathy likely  related to chronic infection or malignancy.   ASSESSMENT AND PLAN:  This is a 50 year old white female with past  medical history significant for frequent urinary tract infections who  presents with 1-day history of right flank pain.  1. Pyelonephritis.  The patient presents with an elevated white blood      count, subjective fever, and flank pain with urinalysis suspicious      for infection.  The patient does not appear dehydrated.  We will      admit the patient for further workup staghorn calculus with      enlarged right kidney and CT findings suspicious for changes of      chronic infection versus malignancy.  This staghorn calculi is      possibly benignities for infection causing her frequent urinary      tract infections.  We will use Rocephin to cover the usual urinary      flow as well as per days.  We will plan on calling Urology in the      morning for further workup.  The patient's pain has been well      controlled on Tylenol and ibuprofen, and that is her preference for      pain control at this time.  We will add Zofran for nausea relief as      needed.  2. Fluids, electrolytes, nutrition/gastrointestinal.  Regular diet.      Normal saline at 125 mL per hour.  3.  Prophylaxis:  SCDs.  The patient is ambulatory.  No indication for      PPI.  4. Disposition, pending workup without renal changes on CT.      Delbert Harness, MD  Electronically Signed  Paula Compton, MD  Electronically Signed    KB/MEDQ  D:  03/28/2009  T:  03/29/2009  Job:  [id number]

## 2011-02-14 NOTE — Consult Note (Signed)
[name], [name]            ACCOUNT NO.:  [id number]   MEDICAL RECORD NO.:  [id number]          PATIENT TYPE:  INP   LOCATION:  5531                         FACILITY:  [facility]   PHYSICIAN:  Autumn Garcia, M.D.    DATE OF BIRTH:  1961/08/07   DATE OF CONSULTATION:  03/25/2009  DATE OF DISCHARGE:                                 CONSULTATION   The patient of Dr. Zachery Garcia.   CHIEF COMPLAINT:  Right flank pain.   HISTORY:  Ms. Autumn Garcia is a 50 year old white female with a 5-year history  of recurrent urinary tract infections with urinary infection about once  every 2-3 months.  She was admitted from the [facility]gency Room on March 24, 2009, for right pyelonephritis with a  staghorn calculus.  She began to have symptoms approximately 2 days ago  with right flank pain, fever, and irritative voiding symptoms.  A CT  urogram showed a complete right staghorn stone, and by report, she was  felt to have possibly a cyst versus an inflammatory focus versus  possible obstruction.  On my review, I think she may have an obstructed  calyx that is associated with parenchymal loss and is mimicking a cyst.  The left kidney has some punctate stones and a questionable peripelvic  cyst.  She did have right flank pain that was worse prior admission, but  it is improving with antibiotics.  Her urinary symptoms, which included  pressure and urinary urgency have improved, as well has her fever.  She  has no prior history of documented stones or prior GU surgery.   Past history is pertinent for allergies to ERYTHROMYCIN, SULFA, CIPRO,  CODEINE, and DEMEROL.   MEDICATIONS ON ADMISSION:  Uroflow, an herbal stone prevention  supplement; Kariva birth control; and amoxicillin.  She is currently on  Rocephin, Tylenol, ibuprofen, and Zofran.   PAST MEDICAL HISTORY:  Pertinent for recurrent urinary tract infections  as noted above.   PAST SURGICAL HISTORY:  Pertinent for cholecystectomy.   She is gravida  0, para 0.   FAMILY HISTORY:  Significant for lymphoma and skin cancer, as well as a  familial history of adrenoleukodystrophy, which 3 of her brother  succumbed to.   SOCIAL HISTORY:  Negative for tobacco or alcohol.  She is currently  unemployed.  She is engaged.   REVIEW OF SYSTEMS:  She complains of some headache.  She reports flank  pain but is improved, is more of a pressure sensation.  She has had no  nausea.  She does have some urinary urgency and frequency that is  improving.  She reports a 68-pound weight loss over the past 2 years  with diet and exercise.  She cut out refined carbohydrates, starches,  fried fish from her diet.  She denies diarrhea or constipation, chest  pain or shortness of breath.  She has no lower extremity edema.  She  denies any skin lesions or enlarged lymph nodes.  She denies any  depression or anxiety.  She is, otherwise, entirely without complaints  per a full review of systems.  PHYSICAL EXAMINATION:  VITAL SIGNS:  Her temperature is 98.1, pulse 72,  respirations 20, blood pressure 102/64.  GENERAL:  She is a well-developed, well-nourished but thin white female,  in no acute distress, alert and oriented x3.  HEENT:  Head and face normocephalic, atraumatic.  She does a tattoo  lateral to the right eye.  NECK:  Supple without mass or bruits.  SKIN:  Warm and dry.  LUNGS:  Clear to auscultation.  HEART:  Regular rate and rhythm.  ABDOMEN:  Soft and flat with mild right upper quadrant right CVA  tenderness.  No mass, hepatosplenomegaly, or hernias are noted.  GENITOURINARY/RECTAL:  Not indicated.  EXTREMITIES:  Full range of motion without edema.  She has no inguinal  adenopathy.  NEUROLOGIC:  She is intact without focal findings.   LABORATORY STUDIES:  Her urine is nitrite positive with pyuria.  Cultures pending.  Her hemoglobin is 10.8, down from 12.1 on admission.  Her white count is 12.2, down from 14.3 on admission.  Her  chemistries  reveal a sodium of 140. Her potassium is down to 3.2 from 4 on  admission.  Chloride is 111, CO2 23, BUN 8, creatinine 0.59, glucose is  155.  Her calcium is only 8.5.  Platelet count is 318.   I have reviewed her CT scan report and films.  She has a complete right  staghorn calculus with low density areas lateral to the stone in the mid  pole.  This could represent a cyst or an inflammatory focus but on my  review, it appears that there may be a stone within the caliceal  infundibulum that could be causing obstruction with overlying  parenchymal loss.  She has a finger of the stone into, what appears to  be, a caliceal infundibulum in this area and a cystic lesion may just be  obstruction with overlying parenchymal loss.  This would be consistent  to some degree with her prolonged history of infection.   IMPRESSION:  1. Right pyelonephritis with clinical improvement on antibiotics.  2. Right staghorn stone with possible obstruction of a calix with      parenchymal loss.  3. Recurrent urinary tract infections.  4. Small left renal stones.  5. Probable left peripelvic cyst.   RECOMMENDATIONS:  1. I will order a CT of the abdomen with IV contrast and delayed views      to better evaluate the intrarenal anatomy on the right to determine      whether she needs more immediate decompression of an obstructed      system.  2. I would hold the ibuprofen since she might need a percutaneous      nephrostomy tube.  3. I believe she will need definitive right percutaneous      nephrolithotomy when the infection has resolved.  I do not believe      that she would need an anatrophic nephrolithotomy.  She does not      appear to have infundibular stenosis, and the stones should be      accessible through one or more percutaneous nephrostomy tracts.      Autumn Garcia, M.D.  Electronically Signed     JJW/MEDQ  D:  03/25/2009  T:  03/26/2009  Job:  [id number]   cc:   Autumn Portela A.  [place] Garcia, M.D.

## 2011-02-16 ENCOUNTER — Encounter (HOSPITAL_COMMUNITY): Payer: Medicaid Other | Admitting: Licensed Clinical Social Worker

## 2011-02-23 ENCOUNTER — Encounter (HOSPITAL_COMMUNITY): Payer: Medicaid Other | Admitting: Licensed Clinical Social Worker

## 2011-03-07 ENCOUNTER — Encounter (INDEPENDENT_AMBULATORY_CARE_PROVIDER_SITE_OTHER): Payer: Medicaid Other | Admitting: Licensed Clinical Social Worker

## 2011-03-07 DIAGNOSIS — F410 Panic disorder [episodic paroxysmal anxiety] without agoraphobia: Secondary | ICD-10-CM

## 2011-03-07 DIAGNOSIS — F4310 Post-traumatic stress disorder, unspecified: Secondary | ICD-10-CM

## 2011-03-16 ENCOUNTER — Encounter (INDEPENDENT_AMBULATORY_CARE_PROVIDER_SITE_OTHER): Payer: Medicaid Other | Admitting: Licensed Clinical Social Worker

## 2011-03-16 DIAGNOSIS — F4310 Post-traumatic stress disorder, unspecified: Secondary | ICD-10-CM

## 2011-03-23 ENCOUNTER — Encounter (HOSPITAL_COMMUNITY): Payer: Medicaid Other | Admitting: Licensed Clinical Social Worker

## 2011-03-24 ENCOUNTER — Encounter (HOSPITAL_COMMUNITY): Payer: Medicaid Other | Admitting: Licensed Clinical Social Worker

## 2011-04-06 ENCOUNTER — Encounter (INDEPENDENT_AMBULATORY_CARE_PROVIDER_SITE_OTHER): Payer: Self-pay | Admitting: Licensed Clinical Social Worker

## 2011-04-06 DIAGNOSIS — F4310 Post-traumatic stress disorder, unspecified: Secondary | ICD-10-CM

## 2011-04-20 ENCOUNTER — Encounter (HOSPITAL_COMMUNITY): Payer: Self-pay | Admitting: Licensed Clinical Social Worker

## 2011-04-27 ENCOUNTER — Encounter (INDEPENDENT_AMBULATORY_CARE_PROVIDER_SITE_OTHER): Payer: Self-pay | Admitting: Licensed Clinical Social Worker

## 2011-04-27 DIAGNOSIS — F4310 Post-traumatic stress disorder, unspecified: Secondary | ICD-10-CM

## 2011-05-04 ENCOUNTER — Encounter (INDEPENDENT_AMBULATORY_CARE_PROVIDER_SITE_OTHER): Payer: Self-pay | Admitting: Licensed Clinical Social Worker

## 2011-05-04 DIAGNOSIS — F4310 Post-traumatic stress disorder, unspecified: Secondary | ICD-10-CM

## 2011-05-11 ENCOUNTER — Encounter (INDEPENDENT_AMBULATORY_CARE_PROVIDER_SITE_OTHER): Payer: Self-pay | Admitting: Licensed Clinical Social Worker

## 2011-05-11 DIAGNOSIS — F4310 Post-traumatic stress disorder, unspecified: Secondary | ICD-10-CM

## 2011-05-23 ENCOUNTER — Encounter (INDEPENDENT_AMBULATORY_CARE_PROVIDER_SITE_OTHER): Payer: Self-pay | Admitting: Licensed Clinical Social Worker

## 2011-05-23 DIAGNOSIS — F4310 Post-traumatic stress disorder, unspecified: Secondary | ICD-10-CM

## 2011-05-30 ENCOUNTER — Encounter (INDEPENDENT_AMBULATORY_CARE_PROVIDER_SITE_OTHER): Payer: Self-pay | Admitting: Licensed Clinical Social Worker

## 2011-05-30 DIAGNOSIS — F4310 Post-traumatic stress disorder, unspecified: Secondary | ICD-10-CM

## 2011-06-06 ENCOUNTER — Encounter (INDEPENDENT_AMBULATORY_CARE_PROVIDER_SITE_OTHER): Payer: Self-pay | Admitting: Licensed Clinical Social Worker

## 2011-06-06 DIAGNOSIS — F4481 Dissociative identity disorder: Secondary | ICD-10-CM

## 2011-06-13 ENCOUNTER — Encounter (INDEPENDENT_AMBULATORY_CARE_PROVIDER_SITE_OTHER): Payer: Self-pay | Admitting: Licensed Clinical Social Worker

## 2011-06-13 DIAGNOSIS — F341 Dysthymic disorder: Secondary | ICD-10-CM

## 2011-06-13 DIAGNOSIS — F4310 Post-traumatic stress disorder, unspecified: Secondary | ICD-10-CM

## 2011-06-20 ENCOUNTER — Encounter (HOSPITAL_COMMUNITY): Payer: Self-pay | Admitting: Licensed Clinical Social Worker

## 2011-06-27 ENCOUNTER — Encounter (INDEPENDENT_AMBULATORY_CARE_PROVIDER_SITE_OTHER): Payer: Self-pay | Admitting: Licensed Clinical Social Worker

## 2011-06-27 DIAGNOSIS — F4481 Dissociative identity disorder: Secondary | ICD-10-CM

## 2011-07-11 ENCOUNTER — Encounter (INDEPENDENT_AMBULATORY_CARE_PROVIDER_SITE_OTHER): Payer: Self-pay | Admitting: Licensed Clinical Social Worker

## 2011-07-11 DIAGNOSIS — F4310 Post-traumatic stress disorder, unspecified: Secondary | ICD-10-CM

## 2011-07-18 ENCOUNTER — Encounter (INDEPENDENT_AMBULATORY_CARE_PROVIDER_SITE_OTHER): Payer: Self-pay | Admitting: Licensed Clinical Social Worker

## 2011-07-18 DIAGNOSIS — F848 Other pervasive developmental disorders: Secondary | ICD-10-CM

## 2011-07-25 ENCOUNTER — Encounter (HOSPITAL_COMMUNITY): Payer: Self-pay | Admitting: Licensed Clinical Social Worker

## 2011-07-26 ENCOUNTER — Encounter (INDEPENDENT_AMBULATORY_CARE_PROVIDER_SITE_OTHER): Payer: Self-pay | Admitting: Licensed Clinical Social Worker

## 2011-07-26 DIAGNOSIS — F449 Dissociative and conversion disorder, unspecified: Secondary | ICD-10-CM

## 2011-08-02 ENCOUNTER — Encounter (INDEPENDENT_AMBULATORY_CARE_PROVIDER_SITE_OTHER): Payer: Self-pay | Admitting: Licensed Clinical Social Worker

## 2011-08-02 DIAGNOSIS — F449 Dissociative and conversion disorder, unspecified: Secondary | ICD-10-CM

## 2011-08-09 ENCOUNTER — Ambulatory Visit (INDEPENDENT_AMBULATORY_CARE_PROVIDER_SITE_OTHER): Payer: Self-pay | Admitting: Licensed Clinical Social Worker

## 2011-08-09 DIAGNOSIS — F4481 Dissociative identity disorder: Secondary | ICD-10-CM

## 2011-08-10 ENCOUNTER — Encounter (HOSPITAL_COMMUNITY): Payer: Self-pay | Admitting: Licensed Clinical Social Worker
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
## 2011-08-11 ENCOUNTER — Encounter (HOSPITAL_COMMUNITY): Payer: Self-pay | Admitting: Licensed Clinical Social Worker

## 2011-08-18 ENCOUNTER — Ambulatory Visit (INDEPENDENT_AMBULATORY_CARE_PROVIDER_SITE_OTHER): Payer: Self-pay | Admitting: Licensed Clinical Social Worker

## 2011-08-18 DIAGNOSIS — F4481 Dissociative identity disorder: Secondary | ICD-10-CM

## 2011-08-22 ENCOUNTER — Encounter (HOSPITAL_COMMUNITY): Payer: Self-pay | Admitting: Licensed Clinical Social Worker
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
## 2011-08-22 NOTE — Patient Instructions (Addendum)
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

## 2011-08-29 ENCOUNTER — Encounter (HOSPITAL_COMMUNITY): Payer: Self-pay | Admitting: Licensed Clinical Social Worker

## 2011-08-29 ENCOUNTER — Ambulatory Visit (INDEPENDENT_AMBULATORY_CARE_PROVIDER_SITE_OTHER): Payer: Self-pay | Admitting: Licensed Clinical Social Worker

## 2011-08-29 DIAGNOSIS — F4481 Dissociative identity disorder: Secondary | ICD-10-CM

## 2011-08-29 NOTE — Patient Instructions (Addendum)
Need to identify what is the past and what is the present. - she felt [name] upset with her when he was not Work more on communication Has trouble when in younger state to deal with problem solving or clarifying communication Become comforted and then process When upset in waiting room and counselor not ready - she may go into group room

## 2011-08-29 NOTE — Progress Notes (Signed)
   THERAPIST PROGRESS NOTE  Session Time: 11:00-12:05  Participation Level: Active  Behavioral Response: Well GroomedAlertAnxious, Dysphoric and Euthymic  Type of Therapy: Individual Therapy  Treatment Goals addressed: Anxiety and Coping  Interventions: Strength-based, Supportive and Reframing  Summary: Autumn Garcia is a 50 y.o. female who presents with a lot of tears and like a young child who feels scared because she is "in trouble".  There was a miscommunication this morning about soup or cereal and [name] needing more time.  She was anxious because [name] likes to be on time.  [name] had trouble understanding her upset because he thought everything was ok.  He was not annoyed about anything. Like with her mother she thought she was going to get punished by food being withheld - [name] did not understand that she wanted soup.  He states that she is stressed coming to therapy..She stated that was because she thought she would not get her disability.  The positive thing about that statement is that she has at least entertained the idea that she could get better. [name] was able to soothe her enough where she came out of the young child and was able to participate as the adult.  She is proud of her ability to find things that are low in cost and even free.  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  She is sending pictures to someone in [place] and someone in [place] so she is developing quite a Administrator, Civil Service.  She is learning how to price things better.  She sold her lion for only 65 dollars.  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  She finds it hard to keep up with [employer] so she is thinking of making her own web site which is a better idea anyway.  [name] can certainly help her set that up. [name] was able to reassure her that she was very important to him and he loved her very much - it was discussed that [name] was able to be vulnerable with her and no one  else.  Suicidal/Homicidal: Nowithout intent/plan  Plan: Return again in 1 week.  Diagnosis: Axis I: Dissociative Identity Disorder    Axis II: Deferred    Deontrey Massi,[name], LCSW 08/29/2011

## 2011-08-30 ENCOUNTER — Encounter (HOSPITAL_COMMUNITY): Payer: Self-pay | Admitting: Psychiatry

## 2011-09-05 ENCOUNTER — Ambulatory Visit (HOSPITAL_COMMUNITY): Payer: Self-pay | Admitting: Licensed Clinical Social Worker

## 2011-09-12 ENCOUNTER — Ambulatory Visit (HOSPITAL_COMMUNITY): Payer: Self-pay | Admitting: Licensed Clinical Social Worker

## 2011-09-13 ENCOUNTER — Ambulatory Visit (HOSPITAL_COMMUNITY): Payer: Self-pay | Admitting: Licensed Clinical Social Worker

## 2011-10-03 DIAGNOSIS — F603 Borderline personality disorder: Secondary | ICD-10-CM | POA: Insufficient documentation

## 2011-10-04 ENCOUNTER — Ambulatory Visit (INDEPENDENT_AMBULATORY_CARE_PROVIDER_SITE_OTHER): Payer: Medicare Other | Admitting: Licensed Clinical Social Worker

## 2011-10-04 DIAGNOSIS — F4481 Dissociative identity disorder: Secondary | ICD-10-CM | POA: Diagnosis not present

## 2011-10-06 ENCOUNTER — Encounter (HOSPITAL_COMMUNITY): Payer: Self-pay | Admitting: Licensed Clinical Social Worker

## 2011-10-06 DIAGNOSIS — C44319 Basal cell carcinoma of skin of other parts of face: Secondary | ICD-10-CM | POA: Diagnosis not present

## 2011-10-06 DIAGNOSIS — F4481 Dissociative identity disorder: Secondary | ICD-10-CM | POA: Insufficient documentation

## 2011-10-06 NOTE — Progress Notes (Signed)
   THERAPIST PROGRESS NOTE  Session Time: 3:05 - 4:00 PM  Participation Level: Active  Behavioral Response: Well GroomedAlertEuthymic  Type of Therapy: Individual Therapy  Treatment Goals addressed: Anxiety and Coping  Interventions: Assertiveness Training, Supportive and Reframing  Summary: Eldine Rencher is a 51 y.o. female who presents with a more relaxed mood today.  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  She has backed off of portraits for awhile - she finished what she had ordered which was about 10.  Counselor encourages her to talk about her work for it is a way for her to see herself as having a skill state that has value.  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  She reported that Christmas was a quiet one with [name] - he gave her these wonderful colored pencils and she is afraid to touch them because they are expensive.  She has a tendency to get supplies for less money.  She did talk about how horrible holidays were when she was growing up - her mother's mood was never predictable.  She could easily be grounded for nothing.  She also talked about her seizures - she has trouble with them if she has the feeling of being trapped - like standing in line or being stuck in traffic.  They leave her pretty wiped out afterwards.  .   Suicidal/Homicidal: Nowithout intent/plan  Plan: Return again in 1 weeks.  Diagnosis: Axis I: Dissociative Identity Disorder    Axis II: Deferred    Jacqulyn Barresi,[name], LCSW 10/06/2011

## 2011-10-17 ENCOUNTER — Ambulatory Visit (INDEPENDENT_AMBULATORY_CARE_PROVIDER_SITE_OTHER): Payer: Medicare Other | Admitting: Licensed Clinical Social Worker

## 2011-10-17 DIAGNOSIS — F4481 Dissociative identity disorder: Secondary | ICD-10-CM | POA: Diagnosis not present

## 2011-10-18 ENCOUNTER — Encounter (HOSPITAL_COMMUNITY): Payer: Self-pay | Admitting: Licensed Clinical Social Worker
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
## 2011-10-24 ENCOUNTER — Ambulatory Visit (INDEPENDENT_AMBULATORY_CARE_PROVIDER_SITE_OTHER): Payer: Medicare Other | Admitting: Licensed Clinical Social Worker

## 2011-10-24 DIAGNOSIS — F4481 Dissociative identity disorder: Secondary | ICD-10-CM | POA: Diagnosis not present

## 2011-10-26 DIAGNOSIS — C44319 Basal cell carcinoma of skin of other parts of face: Secondary | ICD-10-CM | POA: Diagnosis not present

## 2011-10-26 DIAGNOSIS — F411 Generalized anxiety disorder: Secondary | ICD-10-CM | POA: Diagnosis not present

## 2011-10-26 DIAGNOSIS — R5383 Other fatigue: Secondary | ICD-10-CM | POA: Diagnosis not present

## 2011-10-26 DIAGNOSIS — R569 Unspecified convulsions: Secondary | ICD-10-CM | POA: Diagnosis not present

## 2011-10-26 DIAGNOSIS — E559 Vitamin D deficiency, unspecified: Secondary | ICD-10-CM | POA: Diagnosis not present

## 2011-10-26 DIAGNOSIS — F4310 Post-traumatic stress disorder, unspecified: Secondary | ICD-10-CM | POA: Diagnosis not present

## 2011-10-26 DIAGNOSIS — R21 Rash and other nonspecific skin eruption: Secondary | ICD-10-CM | POA: Diagnosis not present

## 2011-10-26 DIAGNOSIS — R5381 Other malaise: Secondary | ICD-10-CM | POA: Diagnosis not present

## 2011-10-26 DIAGNOSIS — M2669 Other specified disorders of temporomandibular joint: Secondary | ICD-10-CM | POA: Diagnosis not present

## 2011-10-26 DIAGNOSIS — G478 Other sleep disorders: Secondary | ICD-10-CM | POA: Diagnosis not present

## 2011-10-26 DIAGNOSIS — R300 Dysuria: Secondary | ICD-10-CM | POA: Diagnosis not present

## 2011-10-31 ENCOUNTER — Encounter (HOSPITAL_COMMUNITY): Payer: Self-pay | Admitting: Licensed Clinical Social Worker

## 2011-10-31 ENCOUNTER — Ambulatory Visit (INDEPENDENT_AMBULATORY_CARE_PROVIDER_SITE_OTHER): Payer: Medicare Other | Admitting: Licensed Clinical Social Worker

## 2011-10-31 DIAGNOSIS — C44319 Basal cell carcinoma of skin of other parts of face: Secondary | ICD-10-CM | POA: Diagnosis not present

## 2011-10-31 DIAGNOSIS — F4481 Dissociative identity disorder: Secondary | ICD-10-CM | POA: Diagnosis not present

## 2011-10-31 NOTE — Patient Instructions (Signed)
To get clearer on triggers of anxiety which comes in the form of running or pseudo seizures Use her art in a way that relieves stress of upcoming surgery

## 2011-10-31 NOTE — Progress Notes (Signed)
   THERAPIST PROGRESS NOTE  Session Time: 3:05 - 4:05  AM  Participation Level: Active  Behavioral Response: CasualAlertAnxious and Euthymic  Type of Therapy: Individual Therapy  Treatment Goals addressed: Anxiety  Interventions: Motivational Interviewing, Strength-based and Supportive  Summary: Autumn Garcia is a 51 y.o. female who presents with anxiety today.  She mainly spent time talking about what happened to her mother with her skin cancer that no one would biopsy until it was out of control.  They tried to save her with some extensive surgery and attempts at plastic surgery - to no avail.  She went through a horrible time with this process.  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  She is to have a hole in her forehead until they can do the plastic surgery.  They will take an area off as large as they need to to get a clean biopsy.  Of course, she is worried about this being the same situation as her mother.  She is going to get a second opinion - mainly because she does not like this doctor - bad bedside manner.  She needs to feel some empathy from her doctor to be able to tolerate the situation.. She is leaning on [name] for reassurance and he is definitely supportive and protective of her.  Encouraged her to see a doctor she can relate to - this situation is going to be hard for her. She has to trust her doctor..   Suicidal/Homicidal: Nowithout intent/plan  Plan: Return again in 1 weeks.  Diagnosis: Axis I: Post Traumatic Stress Disorder and Dissociative Identiy Disorder    Axis II: Deferred    Shana Younge,[name], LCSW 10/31/2011

## 2011-11-03 DIAGNOSIS — C44319 Basal cell carcinoma of skin of other parts of face: Secondary | ICD-10-CM | POA: Diagnosis not present

## 2011-11-07 DIAGNOSIS — L821 Other seborrheic keratosis: Secondary | ICD-10-CM | POA: Diagnosis not present

## 2011-11-07 DIAGNOSIS — D485 Neoplasm of uncertain behavior of skin: Secondary | ICD-10-CM | POA: Diagnosis not present

## 2011-11-08 ENCOUNTER — Ambulatory Visit (INDEPENDENT_AMBULATORY_CARE_PROVIDER_SITE_OTHER): Payer: Medicare Other | Admitting: Licensed Clinical Social Worker

## 2011-11-08 DIAGNOSIS — F449 Dissociative and conversion disorder, unspecified: Secondary | ICD-10-CM | POA: Diagnosis not present

## 2011-11-15 ENCOUNTER — Ambulatory Visit (INDEPENDENT_AMBULATORY_CARE_PROVIDER_SITE_OTHER): Payer: Medicare Other | Admitting: Licensed Clinical Social Worker

## 2011-11-15 ENCOUNTER — Encounter (HOSPITAL_COMMUNITY): Payer: Self-pay | Admitting: Licensed Clinical Social Worker

## 2011-11-15 DIAGNOSIS — F4310 Post-traumatic stress disorder, unspecified: Secondary | ICD-10-CM | POA: Insufficient documentation

## 2011-11-15 DIAGNOSIS — F4481 Dissociative identity disorder: Secondary | ICD-10-CM

## 2011-11-15 DIAGNOSIS — C44309 Unspecified malignant neoplasm of skin of other parts of face: Secondary | ICD-10-CM | POA: Insufficient documentation

## 2011-11-15 NOTE — Progress Notes (Signed)
   THERAPIST PROGRESS NOTE  Session Time: 1:05 - 2:05  Participation Level: Active  Behavioral Response: Fairly GroomedAlertAnxious and Euthymic  Type of Therapy: Individual Therapy  Treatment Goals addressed: Anxiety and Coping  Interventions: Motivational Interviewing  Summary: Autumn Garcia is a 51 y.o. female who presents with problems stemming from a traumatic childhood.  Autumn Garcia mainly discussed her anxiety about surgery and its outcome.  One anxiety is going through the procedure and the other is the result from the biopsy.  There are too many parralels with her mother situation which turned out to be fatal.  She is having more seiques at home and more nightmares.  All of this is understandalbe.  Reminded her that she found a doctor she likes, [name] is going to be in the room with her and her mother's condition was much worse when she finallly got treatment.  She has a difficult time shutting off her thoughts which then bring up all of her anxiety.  Try anyting that will distract her - her art - playing with her dog.  [name] is doing all that he can to reassure her and be there for her.  She agrees that is true.  Suicidal/Homicidal: Nowithout intent/plan  Plan: Return again in 1 weeks.  Diagnosis: Axis I: Post Traumatic Stress Disorder and Dissociative Identity Disorder    Axis II: Deferred    Juliana Boling,[name], LCSW 11/15/2011

## 2011-11-15 NOTE — Progress Notes (Signed)
   THERAPIST PROGRESS NOTE  Session Time: 1:05 - 2:00  Participation Level: Active  Behavioral Response: CasualAlertAnxious and Euthymic  Type of Therapy: Individual Therapy  Treatment Goals addressed: Anxiety  Interventions: Motivational Interviewing, Strength-based and Supportive  Summary: Ronit Cranfield is a 51 y.o. female who presents with some anxiety today.  Reporting more on her feelings re: up and coming surgery.  She did get a second opinion with another Careers adviser.  She liked him a lot more and decided to have him do her surgery.  He took time with her and explained what he would do carefully - answered her questions.  Did not treat her like there was something wrong with her.  Discussed how she could use her art as a way to work on her feelings - she was afraid they would be dark and [name] would not like them. Emphasized that these pieces were not for [name] but for her and if she was in a dark place - what better way to express that but through her art.  Her dog Chloe does feel comforting to her.  Of course she is pleased with how much [name] is there for her.   This is the first time in her life that she has had such a supportive relationship.  It makes her afraid that she will lose him.  He will get tired of her or something bad will happen to him.  Her feelings made sense for her experience but there was no evidence of either happening right now.  If she can establish a good relationship with one person she may do it with others also - meaning just friendships not lovers as [name] is to her..   Suicidal/Homicidal: Nowithout intent/plan  Plan: Return again in 1 weeks.  Diagnosis: Axis I: Dissociative Disorder    Axis II: Deferred    Lehi Phifer,[name], LCSW 11/15/2011

## 2011-11-23 ENCOUNTER — Ambulatory Visit (HOSPITAL_COMMUNITY): Payer: Medicare Other | Admitting: Licensed Clinical Social Worker

## 2011-11-27 DIAGNOSIS — C44319 Basal cell carcinoma of skin of other parts of face: Secondary | ICD-10-CM | POA: Diagnosis not present

## 2011-11-28 DIAGNOSIS — R5381 Other malaise: Secondary | ICD-10-CM | POA: Diagnosis not present

## 2011-11-28 DIAGNOSIS — F4310 Post-traumatic stress disorder, unspecified: Secondary | ICD-10-CM | POA: Diagnosis not present

## 2011-11-28 DIAGNOSIS — C44319 Basal cell carcinoma of skin of other parts of face: Secondary | ICD-10-CM | POA: Diagnosis not present

## 2011-11-28 DIAGNOSIS — M2669 Other specified disorders of temporomandibular joint: Secondary | ICD-10-CM | POA: Diagnosis not present

## 2011-11-28 DIAGNOSIS — E559 Vitamin D deficiency, unspecified: Secondary | ICD-10-CM | POA: Diagnosis not present

## 2011-11-28 DIAGNOSIS — R21 Rash and other nonspecific skin eruption: Secondary | ICD-10-CM | POA: Diagnosis not present

## 2011-11-28 DIAGNOSIS — R569 Unspecified convulsions: Secondary | ICD-10-CM | POA: Diagnosis not present

## 2011-11-28 DIAGNOSIS — F603 Borderline personality disorder: Secondary | ICD-10-CM | POA: Diagnosis not present

## 2011-11-28 DIAGNOSIS — R300 Dysuria: Secondary | ICD-10-CM | POA: Diagnosis not present

## 2011-11-28 DIAGNOSIS — F410 Panic disorder [episodic paroxysmal anxiety] without agoraphobia: Secondary | ICD-10-CM | POA: Diagnosis not present

## 2011-12-05 DIAGNOSIS — C44319 Basal cell carcinoma of skin of other parts of face: Secondary | ICD-10-CM | POA: Diagnosis not present

## 2011-12-05 DIAGNOSIS — R300 Dysuria: Secondary | ICD-10-CM | POA: Diagnosis not present

## 2011-12-05 DIAGNOSIS — E559 Vitamin D deficiency, unspecified: Secondary | ICD-10-CM | POA: Diagnosis not present

## 2011-12-05 DIAGNOSIS — R5381 Other malaise: Secondary | ICD-10-CM | POA: Diagnosis not present

## 2011-12-05 DIAGNOSIS — R21 Rash and other nonspecific skin eruption: Secondary | ICD-10-CM | POA: Diagnosis not present

## 2011-12-05 DIAGNOSIS — A6000 Herpesviral infection of urogenital system, unspecified: Secondary | ICD-10-CM | POA: Diagnosis not present

## 2011-12-07 ENCOUNTER — Ambulatory Visit (INDEPENDENT_AMBULATORY_CARE_PROVIDER_SITE_OTHER): Payer: Medicare Other | Admitting: Licensed Clinical Social Worker

## 2011-12-07 DIAGNOSIS — F4481 Dissociative identity disorder: Secondary | ICD-10-CM | POA: Diagnosis not present

## 2011-12-07 NOTE — Progress Notes (Signed)
   THERAPIST PROGRESS NOTE  Session Time: 2:05 - 3:00  Participation Level: Active  Behavioral Response: CasualAlertAnxious and Euthymic  Type of Therapy: Individual Therapy  Treatment Goals addressed: Anxiety and Coping  Interventions: Motivational Interviewing, Solution Focused and Supportive  Summary: Lilliana Turner is a 51 y.o. female who presents with problems associated with a traumatic childhood.  She has not been in therapy for the past 3 weeks because there was an insurance problem that needed to be taken care of.  She has found a doctor that she really likes - he is very kind and everyone in the office are gentle and kind.  Willing to deal with her anxiety and [name] can stay with her. She is pleased about that but of course still anxious.  She does not have a lot of faith that she will ever get rid of her anxiety and her racing mind.  She feels that she can probably get better - she feels better now but that is because of [name] and the different things that they have done to address the issues.  She does not feel so alone now.  The fragmentations are new to her but do explain a lot of things in her life.  She is still sort of amazed about this realization.  Suicidal/Homicidal: Nowithout intent/plan  Plan: Return again in 1 weeks.  Diagnosis: Axis I: Dissociative Identity Disorder    Axis II: Deferred    Nihal Doan,[name], LCSW 12/07/2011 2

## 2011-12-14 ENCOUNTER — Ambulatory Visit (INDEPENDENT_AMBULATORY_CARE_PROVIDER_SITE_OTHER): Payer: Medicare Other | Admitting: Licensed Clinical Social Worker

## 2011-12-14 DIAGNOSIS — F4310 Post-traumatic stress disorder, unspecified: Secondary | ICD-10-CM

## 2011-12-14 DIAGNOSIS — F4481 Dissociative identity disorder: Secondary | ICD-10-CM

## 2011-12-14 NOTE — Progress Notes (Signed)
   THERAPIST PROGRESS NOTE  Session Time: 2:10 - 3:05  Participation Level: Active  Behavioral Response: CasualAlertEuthymic  Type of Therapy: Individual Therapy  Treatment Goals addressed: Anxiety  Interventions: Motivational Interviewing, Strength-based and Supportive  Summary: Autumn Garcia is a 51 y.o. female who presents with problems with anxiety.  Autumn Garcia mainly concerned about her surgery - she is quite anxious and not just because of the surgery but because of having the same cancer that her mother had.  Autumn Garcia is working with her to relax her as much as possible - she is feeling better about the choice of a doctor.  She needs someone who is kind and understanding of her condition.  Went over some relaxation techniques - she has a hard time using them with good results.  Suicidal/Homicidal: Nowithout intent/plan  Plan: Return again in 1 weeks.  Diagnosis: Axis I: Dissociative Identity Disorder    Axis II: Deferred    Calleen Alvis,[name], LCSW 12/14/2011

## 2011-12-21 ENCOUNTER — Ambulatory Visit (INDEPENDENT_AMBULATORY_CARE_PROVIDER_SITE_OTHER): Payer: Medicare Other | Admitting: Licensed Clinical Social Worker

## 2011-12-21 DIAGNOSIS — F4481 Dissociative identity disorder: Secondary | ICD-10-CM | POA: Diagnosis not present

## 2011-12-21 NOTE — Patient Instructions (Signed)
Thinking about you when you have your surgery on Monday I am sure all will be well.
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
## 2011-12-25 DIAGNOSIS — C44319 Basal cell carcinoma of skin of other parts of face: Secondary | ICD-10-CM | POA: Diagnosis not present

## 2011-12-28 ENCOUNTER — Ambulatory Visit (INDEPENDENT_AMBULATORY_CARE_PROVIDER_SITE_OTHER): Payer: Medicare Other | Admitting: Licensed Clinical Social Worker

## 2011-12-28 DIAGNOSIS — F4481 Dissociative identity disorder: Secondary | ICD-10-CM

## 2012-01-03 NOTE — Progress Notes (Signed)
   THERAPIST PROGRESS NOTE  Session Time: 2:05 - 3:00  Participation Level: Active  Behavioral Response: CasualAlertAnxious  Type of Therapy: Individual Therapy  Treatment Goals addressed: Anxiety and Coping  Interventions: Motivational Interviewing and Supportive  Summary: Autumn Garcia is a 51 y.o. female who presents with problems stemming from a traumatic childhood.  She had her surgery on Monday and it took about 8 hours because they had to keep sending samples of skin to make sure they got it all.  It looks like she has the skin cancer with the least problems. They are going to intensely study her samples to make sure it is not Desmoplastic melanoma like her mother.  The doctor and the nurse worked very well with her - she was as comfortable as one could be under the circumstances - she did not have a seizure and of course [name] was by her side.  They did give her valium and that also helped.  She was even making jokes and being funny  Suicidal/Homicidal: Nowithout intent/plan  Plan: Return again in 1 weeks.  Diagnosis: Axis I: Dissociative Identity Disorder    Axis II: Deferred    Jadeyn Hargett,[name], LCSW 01/03/2012

## 2012-01-05 ENCOUNTER — Ambulatory Visit (INDEPENDENT_AMBULATORY_CARE_PROVIDER_SITE_OTHER): Payer: Medicare Other | Admitting: Licensed Clinical Social Worker

## 2012-01-05 DIAGNOSIS — F4481 Dissociative identity disorder: Secondary | ICD-10-CM | POA: Diagnosis not present

## 2012-01-05 NOTE — Progress Notes (Signed)
   THERAPIST PROGRESS NOTE  Session Time: 10 - 3:05  Participation Level: Active  Behavioral Response: CasualAlertEuthymic  Type of Therapy: Individual Therapy  Treatment Goals addressed: Anxiety and Coping  Interventions: Motivational Interviewing, Strength-based and Supportive  Summary: Autumn Garcia is a 51 y.o. female who presents with problems with anxiety. Autumn Garcia reports that she knows she has anxiety about obvious things like her surgery and that feels normal except if she has a seizure. There are some things that trigger her anxiety and she is not aware of what the connection is - those are the things that need to be explored more.  Discussed how it can be something very innocuous that would trigger the feelings but that "thing" reminds of something from your past.  We could work on exploring those situations more.  Suicidal/Homicidal: Nowithout intent/plan  Plan: Return again in 1 weeks.  Diagnosis: Axis I: Dissociative Identity Disorder    Axis II: Deferred    Autumn Garcia,[name], LCSW 01/05/2012

## 2012-01-12 ENCOUNTER — Ambulatory Visit (INDEPENDENT_AMBULATORY_CARE_PROVIDER_SITE_OTHER): Payer: Medicare Other | Admitting: Licensed Clinical Social Worker

## 2012-01-12 DIAGNOSIS — F4481 Dissociative identity disorder: Secondary | ICD-10-CM
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
## 2012-01-17 ENCOUNTER — Encounter (HOSPITAL_COMMUNITY): Payer: Self-pay | Admitting: Licensed Clinical Social Worker

## 2012-01-17 ENCOUNTER — Ambulatory Visit (INDEPENDENT_AMBULATORY_CARE_PROVIDER_SITE_OTHER): Payer: Medicare Other | Admitting: Licensed Clinical Social Worker

## 2012-01-17 DIAGNOSIS — F4481 Dissociative identity disorder: Secondary | ICD-10-CM | POA: Diagnosis not present

## 2012-01-17 NOTE — Progress Notes (Signed)
   THERAPIST PROGRESS NOTE  Session Time: 12:00 - 1;00  Participation Level: Active  Behavioral Response: CasualAlertAnxious and Euthymic  Type of Therapy: Individual Therapy  Treatment Goals addressed: Anxiety and Coping  Interventions: Motivational Interviewing, Strength-based and Supportive  Summary: Rayanna Matusik is a 51 y.o. female who presents with anxiety and PTSD from traumatic childhood.  [name] reported that she is having trouble getting into her art this week. Nothing coming together. Discussed more about her diagnosis.  She had never been diagnosed with this before. Actually [name] started putting things together and had wondered about DID.  She talked more about how her sister was affected by her mother. She was treated better than [facility] but she was impacted by mother's problems.  There is more of a possibility of exploring more directly her perceptions and knowledge that she has about her different identities.  Her favorite is the two little children - one is the scared one and the other is the playful flirtatious one.  Both have been present in the office.  She does not have names for them .  Discussed how her relationship with [name] has been good for her. It was important that she let him in so that he could understand her.  She said that she opened up kicking and screaming.  She was very reluctant to tell him anything but he was persistent and insistent on understanding her.  It felt bad at first but she is glad that he did that now.  Shared with her that I knew she would have trouble meeting with counselor without [name].  She admitted that she probably would not have talked to me.  She still feels overwhelmed and feels she still does not know who she is.  Reassured her that we would work on re-integrating her identities so that she felt like a whole person.  Suicidal/Homicidal: Nowithout intent/plan  Plan: Return again in 1 week.  Diagnosis: Axis I: Dissociative Identity  Disorder    Axis II: Deferred    Demiyah Fischbach,[name], LCSW 01/17/2012

## 2012-01-19 ENCOUNTER — Ambulatory Visit (HOSPITAL_COMMUNITY): Payer: Self-pay | Admitting: Licensed Clinical Social Worker

## 2012-01-23 ENCOUNTER — Ambulatory Visit (INDEPENDENT_AMBULATORY_CARE_PROVIDER_SITE_OTHER): Payer: Medicare Other | Admitting: Licensed Clinical Social Worker

## 2012-01-23 DIAGNOSIS — F4481 Dissociative identity disorder: Secondary | ICD-10-CM

## 2012-01-23 NOTE — Progress Notes (Signed)
   THERAPIST PROGRESS NOTE  Session Time: 2:00 - 3:00  Participation Level: Active  Behavioral Response: CasualAlertAnxious  Type of Therapy: Individual Therapy  Treatment Goals addressed: Anxiety and Coping  Interventions: Motivational Interviewing and Supportive  Summary: Lukisha Procida is a 51 y.o. female who presents with problems with a dissociative disorder because of traumatic childhood.  Today she seems more tense - she reports she has concerns about traveling to [place] with [name] because he has a job to do there for his consulting job.  He tries to set up the situation so that a lot of his work can be done on the compute back at [employer]  He checks on her frequently because she cannot always reach him for cell reception is not always good at these Plants. They are abe to bring the dog and she is prepared with art books that she wants to explore and some art materials to work on.  She is mainly anxious about her reactions to any new stimuli.  She is trying to prepare the best way that she can.  Discussed how she remembers her dreams well - suggested that writing them down might be a good way to learn what she is dealing with underneath. She agreed and she has kept a dream journal before.   Suicidal/Homicidal: Nowithout intent/plan  Plan: Return again in 2 weeks.  Diagnosis: Axis I: Dissociative Identity Disorder    Axis II: Deferred    Doryce Mcgregory,[name], LCSW 01/23/2012

## 2012-02-06 ENCOUNTER — Ambulatory Visit (INDEPENDENT_AMBULATORY_CARE_PROVIDER_SITE_OTHER): Payer: Medicare Other | Admitting: Licensed Clinical Social Worker

## 2012-02-06 DIAGNOSIS — F4481 Dissociative identity disorder: Secondary | ICD-10-CM
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
## 2012-02-07 DIAGNOSIS — C44319 Basal cell carcinoma of skin of other parts of face: Secondary | ICD-10-CM | POA: Diagnosis not present

## 2012-02-07 DIAGNOSIS — M2669 Other specified disorders of temporomandibular joint: Secondary | ICD-10-CM | POA: Diagnosis not present

## 2012-02-07 DIAGNOSIS — F4310 Post-traumatic stress disorder, unspecified: Secondary | ICD-10-CM | POA: Diagnosis not present

## 2012-02-07 DIAGNOSIS — R569 Unspecified convulsions: Secondary | ICD-10-CM | POA: Diagnosis not present

## 2012-02-07 DIAGNOSIS — E559 Vitamin D deficiency, unspecified: Secondary | ICD-10-CM | POA: Diagnosis not present

## 2012-02-07 DIAGNOSIS — M25569 Pain in unspecified knee: Secondary | ICD-10-CM | POA: Diagnosis not present

## 2012-02-07 DIAGNOSIS — M2240 Chondromalacia patellae, unspecified knee: Secondary | ICD-10-CM | POA: Diagnosis not present

## 2012-02-13 ENCOUNTER — Ambulatory Visit (INDEPENDENT_AMBULATORY_CARE_PROVIDER_SITE_OTHER): Payer: Medicare Other | Admitting: Licensed Clinical Social Worker

## 2012-02-13 DIAGNOSIS — F4481 Dissociative identity disorder: Secondary | ICD-10-CM | POA: Diagnosis not present

## 2012-02-13 NOTE — Progress Notes (Signed)
   THERAPIST PROGRESS NOTE  Session Time: 2:00 - 3:00  Participation Level: Active  Behavioral Response: CasualAlertEuthymic  Type of Therapy: Individual Therapy  Treatment Goals addressed: Anxiety and Coping  Interventions: Motivational Interviewing and Supportive  Summary: Justin Buechner is a 51 y.o. female who presents with problems with anxiety.  [name] reports that they are working on the situation with [name]'s family.  It is really stressing both of them.  [name] is angry and frustrated.  They are in the process of deciding whether to go to mediation or to go to a jury trial..[name] wants to wait until discovery is finished.  Some of the things they are being accused of are ridiculous and not true.  [name] knows his mother would tell stories to get attention and make her situation look bad.  [name] is doing all she can to be there for [name] - trying not to think about the fact that she might have to testify in court.  Suicidal/Homicidal: No  Plan: Return again in 1 weeks.  Diagnosis: Axis I: Dissociative Identity Disorder    Axis II: Deferred    Raffael Bugarin,[name], LCSW 02/13/2012

## 2012-02-20 ENCOUNTER — Ambulatory Visit (HOSPITAL_COMMUNITY): Payer: Self-pay | Admitting: Licensed Clinical Social Worker

## 2012-02-22 ENCOUNTER — Ambulatory Visit (INDEPENDENT_AMBULATORY_CARE_PROVIDER_SITE_OTHER): Payer: Medicare Other | Admitting: Licensed Clinical Social Worker

## 2012-02-22 DIAGNOSIS — F4481 Dissociative identity disorder: Secondary | ICD-10-CM | POA: Diagnosis not present

## 2012-02-22 NOTE — Progress Notes (Signed)
   THERAPIST PROGRESS NOTE  Session Time: 2:10 - 3:15  Participation Level: Active  Behavioral Response: CasualAlertAnxious, Depressed and Euthymic  Type of Therapy: Individual Therapy  Treatment Goals addressed: Anxiety and Coping  Interventions: Motivational Interviewing and Supportive  Summary: Astella Desir is a 51 y.o. female who presents with problems with anxiety.  [name] reports that she and [name] have been doing a lot of work with the lawyer to get this suite done.  It is stressful but progressing.  The whole situation is upsetting to [place] because she knows how good [name] was to his mother.  His mother was so difficult to deal with but he managed things quite well and kept his patience with her.  She feels his family should be appreciative of him taking over and caring for her and every time she went to his sisters, she was sick and he had to straighten out her medical needs again..   Suicidal/Homicidal: No  Plan: Return again in 1 weeks.  Diagnosis: Axis I: Dissociative Identity Disorder    Axis II: Deferred    Mardene Lessig,[name], LCSW 02/22/2012

## 2012-02-27 ENCOUNTER — Ambulatory Visit (INDEPENDENT_AMBULATORY_CARE_PROVIDER_SITE_OTHER): Payer: Medicare Other | Admitting: Licensed Clinical Social Worker

## 2012-02-27 DIAGNOSIS — F4481 Dissociative identity disorder: Secondary | ICD-10-CM

## 2012-02-27 NOTE — Progress Notes (Signed)
   THERAPIST PROGRESS NOTE  Session Time: 2:30 - 3:30  Participation Level: Active  Behavioral Response: CasualAlertEuthymic  Type of Therapy: Individual Therapy  Treatment Goals addressed: Anxiety  Interventions: Motivational Interviewing and Supportive  Summary: Autumn Garcia is a 51 y.o. female who presents with problems with dissociation.  Appeared to be in a good mood today.  Only had one seizure last week - having a lot of dreams - feels she needs to write them down again.  They can be long and intricate - the themes seem to be around abandonment or abduction.  She sees color and hears things in her dreams -  So they are detailed and very real feeling.  Sometimes it takes her awhile to get out of the dream state and get grounded where she is in reality.  She has more of that problem if she takes a nap in the day time.  She does not wake up to the monster creature anymore.  When she gets up in the middle of the night to go to the bathroom it can be scarey for she sees creatures scamper.  Sometimes she can predict when she will have a seizure - the day before her hands sweat and she hears a noise in her head.   She also sleep walks - she will do the laundry - clean the house and do other constructive things.  [name] stated that her dreams are often associated with things she has seen on TV or a video game.  She is strongly suggestible.  She talked a lot about the imorovement she has made at [name]'s house - particularly on the outside - he had nothing in the yard or very minimal and she has added a lot.  She works hard and researches everything that she does.  [name] was drinking 3 pots of coffee a day and he has quit it all because of his health.  She is glad that he has done that.   She wanted to do a squiggle drawing today - so she put a lot of lines on the paper with her eyes closed and then worked on a drawing of what she saw.  First it was an airplane and then it turned into a very fancy fish.   She said it was an angry fish and the color was red.  She could not relate to the anger.  She stopped in the middle to talk about a mermaid they have found and she drew a picture of that.  .   Suicidal/Homicidal: No  Plan: Return again in 1 weeks.  Diagnosis: Axis I: Dissociative Identity Disorder    Axis II: Deferred    Timothee Gali,[name], LCSW 02/27/2012
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
## 2012-03-07 ENCOUNTER — Ambulatory Visit (INDEPENDENT_AMBULATORY_CARE_PROVIDER_SITE_OTHER): Payer: Medicare Other | Admitting: Licensed Clinical Social Worker

## 2012-03-07 DIAGNOSIS — F4481 Dissociative identity disorder: Secondary | ICD-10-CM | POA: Diagnosis not present
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
## 2012-03-14 ENCOUNTER — Ambulatory Visit (INDEPENDENT_AMBULATORY_CARE_PROVIDER_SITE_OTHER): Payer: Medicare Other | Admitting: Licensed Clinical Social Worker

## 2012-03-14 DIAGNOSIS — F4481 Dissociative identity disorder: Secondary | ICD-10-CM
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
## 2012-03-15 ENCOUNTER — Other Ambulatory Visit: Payer: Self-pay | Admitting: Family Medicine

## 2012-03-15 ENCOUNTER — Other Ambulatory Visit: Payer: Self-pay | Admitting: *Deleted

## 2012-03-15 DIAGNOSIS — Z1231 Encounter for screening mammogram for malignant neoplasm of breast: Secondary | ICD-10-CM

## 2012-03-22 ENCOUNTER — Ambulatory Visit (INDEPENDENT_AMBULATORY_CARE_PROVIDER_SITE_OTHER): Payer: Medicare Other | Admitting: Licensed Clinical Social Worker

## 2012-03-22 DIAGNOSIS — F4481 Dissociative identity disorder: Secondary | ICD-10-CM
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
## 2012-03-25 ENCOUNTER — Ambulatory Visit (INDEPENDENT_AMBULATORY_CARE_PROVIDER_SITE_OTHER): Payer: Medicare Other | Admitting: Psychiatry

## 2012-03-25 ENCOUNTER — Encounter (HOSPITAL_COMMUNITY): Payer: Self-pay | Admitting: Psychiatry

## 2012-03-25 VITALS — BP 118/78 | Ht 64.0 in | Wt 136.0 lb

## 2012-03-25 DIAGNOSIS — F4310 Post-traumatic stress disorder, unspecified: Secondary | ICD-10-CM | POA: Diagnosis not present

## 2012-03-25 DIAGNOSIS — F4481 Dissociative identity disorder: Secondary | ICD-10-CM | POA: Diagnosis not present

## 2012-03-25 MED ORDER — SERTRALINE HCL 25 MG PO TABS: 25.0000 mg | ORAL_TABLET | Freq: Every day | ORAL | Status: DC

## 2012-03-25 MED ORDER — DIAZEPAM 2 MG PO TABS: 2.0000 mg | ORAL_TABLET | Freq: Four times a day (QID) | ORAL | Status: AC | PRN
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
## 2012-04-02 ENCOUNTER — Ambulatory Visit: Payer: Self-pay

## 2012-04-02 ENCOUNTER — Ambulatory Visit (INDEPENDENT_AMBULATORY_CARE_PROVIDER_SITE_OTHER): Payer: Medicare Other

## 2012-04-02 DIAGNOSIS — Z1231 Encounter for screening mammogram for malignant neoplasm of breast: Secondary | ICD-10-CM

## 2012-04-05 ENCOUNTER — Ambulatory Visit (INDEPENDENT_AMBULATORY_CARE_PROVIDER_SITE_OTHER): Payer: Medicare Other | Admitting: Licensed Clinical Social Worker

## 2012-04-05 DIAGNOSIS — F4481 Dissociative identity disorder: Secondary | ICD-10-CM | POA: Diagnosis not present

## 2012-04-07 NOTE — Progress Notes (Signed)
   THERAPIST PROGRESS NOTE  Session Time: 1:00 - 2:00  Participation Level: Active  Behavioral Response: CasualAlertEuthymic  Type of Therapy: Individual Therapy  Treatment Goals addressed: Anxiety  Interventions: Motivational Interviewing and Supportive  Summary: Sakina Briones is a 51 y.o. female who presents with severe problems with anxiety.  She had to have a mammogram and she took the klonopin for that and it helped.  She is more sleepy on the Zoloft - she tends to fall asleep a lot.  Hard to tell if helping.  She became comfortable with Dr. Christell Constant and did like her.  She talked about her panic and how she feels like she is going to have a heart attack.  She finds that her anxiety makes her body hurt.  It is painful. She has always overreacted even as a baby.  Her dog can tell when she is going to have one of her seizures - she runs to get [name].  The dog is highly sensitive to her and very attached which is good for [facility].  She feels she is in the best relationship that she has ever been in with [name] - he is so care taking of her.  She does feel afraid of losing him.  She has a lot of different reactions to anxiety.  Her body seems to be very sensitive.   This is the first time she has ever talked about her family situations and herself - first with [name] and now this counselor.  She has always taken her symptoms and studied them in books and tried to help herself.  She has always been secretive about her life.  She has not shared it all yet.  When she first came, I touched her arm and it hurt - now she wants a hug when she leaves and she does not hurt anymore. Need to explore with her any fears that she might have about [name] leaving her if she got better..   Suicidal/Homicidal: No  Plan: Return again in 2 weeks.  Diagnosis: Axis I: Dissociative Identity Disorder    Axis II: Deferred    Morgan Keinath,[name], LCSW 04/07/2012

## 2012-04-17 ENCOUNTER — Ambulatory Visit (INDEPENDENT_AMBULATORY_CARE_PROVIDER_SITE_OTHER): Payer: Medicare Other | Admitting: Licensed Clinical Social Worker

## 2012-04-17 DIAGNOSIS — F4310 Post-traumatic stress disorder, unspecified: Secondary | ICD-10-CM

## 2012-04-25 ENCOUNTER — Encounter (HOSPITAL_COMMUNITY): Payer: Self-pay | Admitting: Psychiatry

## 2012-04-25 ENCOUNTER — Ambulatory Visit (INDEPENDENT_AMBULATORY_CARE_PROVIDER_SITE_OTHER): Payer: Medicare Other | Admitting: Psychiatry

## 2012-04-25 VITALS — BP 118/75 | Ht 64.0 in | Wt 150.0 lb

## 2012-04-25 DIAGNOSIS — F4481 Dissociative identity disorder: Secondary | ICD-10-CM

## 2012-04-25 DIAGNOSIS — F4310 Post-traumatic stress disorder, unspecified: Secondary | ICD-10-CM

## 2012-04-25 MED ORDER — QUETIAPINE FUMARATE 25 MG PO TABS: ORAL_TABLET | ORAL | Status: DC

## 2012-04-25 NOTE — Progress Notes (Signed)
   [facility] Initial Outpatient Visit  Autumn Garcia 10/06/1960   Subjective: The patient is a 51 year old female who has been followed by [facility] since December of 2011. She has been seen by Autumn Garcia this whole time. He saw her for an initial appointment on 03/25/2012. At that time she was diagnosed with PTSD along with 3 times a day and pseudoseizures. I started her on Zoloft 25 mg in the morning along with Valium 2 mg as needed for anxiety. She presents today with her husband. She states that she is sleeping much better. She is having more vivid nightmares. When she first started the medication, she was dizzy and sleeping a lot. She and her husband cut the medication down to one half tablet daily. She was having issues with dizziness first thing in the morning so she moved the medication at bedtime. She still is having some dizziness and weakness in the morning. She states that her heart rate will drop from 60 down to the 40s. She and her husband are doing act in his diet. The patient only required Valium x2 since last appointment. One was before her mammogram. Patient wants to try continuing the Zoloft rather than switching. She is very sensitive to medications. Patient had passed out about our prior to our appointment. She still a little weak from that episode.  Filed Vitals:   04/25/12 1426  BP: 118/75    Mental Status Examination  Appearance: Casual, holds teddy bear. Alert: Yes Attention: good  Cooperative: Yes Eye Contact: Good Speech: Hesitant at times, monotone Psychomotor Activity: Decreased Memory/Concentration: Intact Oriented: person, place, time/date and situation Mood: Anxious Affect: Constricted Thought Processes and Associations: Goal Directed Fund of Knowledge: Fair Thought Content: No suicidal or homicidal thoughts Insight: Fair Judgement: Fair  Diagnosis: Posttraumatic stress disorder, disassociative identity disorder,  pseudoseizures  Treatment Plan: I will continue Zoloft at 12.5 mg at bedtime along with Valium as needed. I will start Seroquel 25 mg 1/2-1 at bedtime for poor sleep and nightmares. I will see the patient back in one month.  Jamse Mead, MD

## 2012-05-01 ENCOUNTER — Ambulatory Visit (INDEPENDENT_AMBULATORY_CARE_PROVIDER_SITE_OTHER): Payer: Medicare Other | Admitting: Licensed Clinical Social Worker

## 2012-05-01 DIAGNOSIS — F4310 Post-traumatic stress disorder, unspecified: Secondary | ICD-10-CM | POA: Diagnosis not present

## 2012-05-02 NOTE — Progress Notes (Signed)
  THERAPIST PROGRESS NOTE  Session Time: 3:05 - 3:55  Participation Level: Active  Behavioral Response: CasualAlertEuthymic  Type of Therapy: Individual Therapy  Treatment Goals addressed: Anxiety and Coping  Interventions: Motivational Interviewing and Supportive  Summary: Autumn Garcia is a 51 y.o. female who presents with anxiety.  [name] brought in two paintings that are considered "dark" by [name].  One was a hand over a fish upside down and an eye open.  There was black hair intertwined in the subjects and as a background - there were translucent bubbles.  The color scheme was blue and black and white.  She does not know what she was feeling at the time.  And she does not know what it symbolizes - took a picture of both. The other had been altered from the original - it was two trees intertwined and there were hearts in the painting but they were gone for it looked like the wind was blowing through the trees.  There was a metallic gold finish on it.  There were reds, browns, golds, and black in the painting. She felt this was a light painting.  She had a bandage on the right side of her face - she had another skin cancer thing taken off.  She said it went ok.  Her dog is sensitive to her seizures and barks when she has one - she also goes to get [name]. She has not been painting because she wants to do paintings of what is in her head which may be dark - she does not want to do just pretty pictures.  She is concerned about how [name] feels = he is ok about anything that she does but he tries to help her see he may not like everything that she does.      Suicidal/Homicidal: Nowithout intent/plan  Plan: Return again in 2 weeks.  Diagnosis: Axis I: Post Traumatic Stress Disorder    Axis II: Deferred    Armani Gawlik,[name], LCSW 05/02/2012

## 2012-05-09 DIAGNOSIS — N390 Urinary tract infection, site not specified: Secondary | ICD-10-CM | POA: Diagnosis not present

## 2012-05-09 DIAGNOSIS — R300 Dysuria: Secondary | ICD-10-CM | POA: Diagnosis not present

## 2012-05-15 ENCOUNTER — Ambulatory Visit (HOSPITAL_COMMUNITY): Payer: Self-pay | Admitting: Licensed Clinical Social Worker

## 2012-05-15 DIAGNOSIS — N281 Cyst of kidney, acquired: Secondary | ICD-10-CM | POA: Diagnosis not present

## 2012-05-15 DIAGNOSIS — N200 Calculus of kidney: Secondary | ICD-10-CM | POA: Diagnosis not present

## 2012-05-17 ENCOUNTER — Ambulatory Visit (INDEPENDENT_AMBULATORY_CARE_PROVIDER_SITE_OTHER): Payer: Medicare Other | Admitting: Licensed Clinical Social Worker

## 2012-05-17 DIAGNOSIS — F4481 Dissociative identity disorder: Secondary | ICD-10-CM
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
## 2012-05-20 DIAGNOSIS — N390 Urinary tract infection, site not specified: Secondary | ICD-10-CM | POA: Diagnosis not present

## 2012-05-23 DIAGNOSIS — N200 Calculus of kidney: Secondary | ICD-10-CM | POA: Diagnosis not present

## 2012-05-27 NOTE — Progress Notes (Signed)
   THERAPIST PROGRESS NOTE  Session Time: 1:05 - 2:00  Participation Level: Active  Behavioral Response: CasualAlertEuthymic  Type of Therapy: Individual Therapy  Treatment Goals addressed: Anxiety  Interventions: Motivational Interviewing and Supportive  Summary: Autumn Garcia is a 51 y.o. female who presents with PTSD.  Autumn Garcia reports that for the last 2 weeks she has slept through the night about half of the time. She still had dreams and they were more vivid She had a hard time getting up and she felf drugged and fatigued all of the time. That's and effect she does not like - discussed how it may be just getting used to the medication. She has had less seizures;  Her dog reacts to her seizures for she runs barking to get [name].  She is having some paranoid feelings and she sees the gargoyle every day again.  Reenforced that she need to talk with Dr. Christell Constant about any effects that she is having.  She has not been painting or creating in the last couple of weeks - feels too tired. .   Suicidal/Homicidal: Nowithout intent/plan  Plan: Return again in 2 weeks.  Diagnosis: Axis I: Post Traumatic Stress Disorder    Axis II: Deferred    Evia Goldsmith,[name], LCSW 05/27/2012

## 2012-05-28 ENCOUNTER — Ambulatory Visit (INDEPENDENT_AMBULATORY_CARE_PROVIDER_SITE_OTHER): Payer: Medicare Other | Admitting: Psychiatry

## 2012-05-28 VITALS — BP 112/72 | Ht 64.0 in | Wt 147.0 lb

## 2012-05-28 DIAGNOSIS — F4481 Dissociative identity disorder: Secondary | ICD-10-CM | POA: Diagnosis not present

## 2012-05-28 DIAGNOSIS — F4310 Post-traumatic stress disorder, unspecified: Secondary | ICD-10-CM | POA: Diagnosis not present

## 2012-05-28 MED ORDER — DIAZEPAM 2 MG PO TABS
2.0000 mg | ORAL_TABLET | Freq: Every evening | ORAL | Status: AC | PRN
Start: 2012-05-28 — End: 2012-06-07

## 2012-05-29 ENCOUNTER — Encounter (HOSPITAL_COMMUNITY): Payer: Self-pay | Admitting: Psychiatry

## 2012-05-29 NOTE — Progress Notes (Signed)
   [facility] Initial Outpatient Visit  Autumn Garcia July 18, 1961   Subjective: The patient is a 51 year old female who has been followed by [facility] since December of 2011. She has been seen by Merlene Morse this whole time. I have been treating her since June of 2013. At that time she was diagnosed with PTSD along with disassociative identity disorder and pseudoseizures. At her last appointment, I continued the Zoloft and Valium. I gave her Seroquel to take at bedtime for sleep and nightmares. She presents today with her husband. She has stopped her Zoloft. She states that she felt "comatose". She was sleeping approximately 20 hours a day. She states the Zoloft made her achy and nausea [place]. She feels better now that she is off it. She continues to take one half I am at bedtime. She has not started the Seroquel yet. She still having bizarre nightmares. Every now and then she can know where it came from. Last night she jumped about deadly worms. She had thought earth worms and a nemotode the day before for her garden. She reports when she has bad days, she feels like she is a kaleidoscope in multiple pieces. This happens about every other day. She fell yesterday. The door shut and her husband in here. The dog was upstairs wasn't able to alert him. The patient reports that if he is working, she will read [employer]. This keeps her from becoming too emotional and math calms her down.  Filed Vitals:   05/29/12 0931  BP: 112/72    Mental Status Examination  Appearance: Casual, holds teddy bear. Alert: Yes Attention: good  Cooperative: Yes Eye Contact: Good Speech: Hesitant at times, monotone Psychomotor Activity: Decreased Memory/Concentration: Intact Oriented: person, place, time/date and situation Mood: Anxious Affect: Constricted Thought Processes and Associations: Goal Directed Fund of Knowledge: Fair Thought Content: No suicidal or homicidal  thoughts Insight: Fair Judgement: Fair  Diagnosis: Posttraumatic stress disorder, disassociative identity disorder, pseudoseizures  Treatment Plan: I will discontinue the Zoloft. Patient may continue Valium one half tablet at bedtime as needed. She will at me know if and when she starts the Seroquel. I will check on her in 2 months.  Jamse Mead, MD

## 2012-05-30 ENCOUNTER — Ambulatory Visit (INDEPENDENT_AMBULATORY_CARE_PROVIDER_SITE_OTHER): Payer: Medicare Other | Admitting: Licensed Clinical Social Worker

## 2012-05-30 DIAGNOSIS — F449 Dissociative and conversion disorder, unspecified: Secondary | ICD-10-CM

## 2012-06-03 DIAGNOSIS — F449 Dissociative and conversion disorder, unspecified: Secondary | ICD-10-CM | POA: Insufficient documentation

## 2012-06-03 NOTE — Progress Notes (Signed)
   THERAPIST PROGRESS NOTE  Session Time: 3:10 - 4:00  Participation Level: Active  Behavioral Response: CasualAlertEuthymic  Type of Therapy: Individual Therapy  Treatment Goals addressed: Anxiety and Coping  Interventions: Motivational Interviewing and Supportive  Summary: Autumn Garcia is a 51 y.o. female who presents with anxiety.  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  She is doing a lot to learn more and more. She loves to learn new things.  She is highly motivated with her art right now.  Learning new techniques and being more productive with her art. 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  She had emptied it for awhile. . She feels productive and is excited about what she is doing.  She brings in art to show me and she feels good about sharing her knowledge.  Her dreams were not too bad for the last two weeks. And the seizures seem to be less.  Her dog is very in tune with her and she runs and barks for [name] when she is having difficulty  Suicidal/Homicidal: Nowithout intent/plan  Plan: Return again in 2 weeks.  Diagnosis: Axis I: Dissociative Disorder    Axis II: Deferred    Autumn Garcia,[name], LCSW 06/03/2012

## 2012-06-13 ENCOUNTER — Ambulatory Visit (INDEPENDENT_AMBULATORY_CARE_PROVIDER_SITE_OTHER): Payer: Medicare Other | Admitting: Licensed Clinical Social Worker

## 2012-06-13 DIAGNOSIS — F449 Dissociative and conversion disorder, unspecified: Secondary | ICD-10-CM | POA: Diagnosis not present

## 2012-06-13 NOTE — Progress Notes (Signed)
   THERAPIST PROGRESS NOTE  Session Time: 2:10 - 3:00  Participation Level: Active  Behavioral Response: CasualAlertAnxious and Euthymic  Type of Therapy: Individual Therapy  Treatment Goals addressed: Anxiety and Coping  Interventions: Motivational Interviewing and Supportive  Summary: Autumn Garcia is a 51 y.o. female who presents with anxiety.   Did not notice until session over that she had not brought her bear.  Dr. Christell Constant had shared that she told he at the last session that I did not like the bear.  Need to check that out at next session. She reported thatt she is busy with a workshop on the Internet about how to do sunsets.  She is also connected to a blog where they do 30 paintings in 30 days.   She isi enjoying that  - she has been very active in her art..  She showed me some drawing in silver and gold.  Special pens that have silver and gold is what she used.. 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  She has had less seizures and she is sleeping better. She is becoming more productive and less anxious which is showing up with less seizure activity...   Suicidal/Homicidal: Nowithout intent/plan  Plan: Return again in 2 weeks.  Diagnosis: Axis I: Dissociative Disorder    Axis II: Deferred    Kishon Garriga,[name], LCSW 06/13/2012

## 2012-06-27 ENCOUNTER — Ambulatory Visit (INDEPENDENT_AMBULATORY_CARE_PROVIDER_SITE_OTHER): Payer: Medicare Other | Admitting: Licensed Clinical Social Worker

## 2012-06-27 DIAGNOSIS — F449 Dissociative and conversion disorder, unspecified: Secondary | ICD-10-CM

## 2012-06-27 NOTE — Progress Notes (Signed)
   THERAPIST PROGRESS NOTE  Session Time: 3:05 - 4:00  Participation Level: Active  Behavioral Response: CasualAlertDepressed  Type of Therapy: Individual Therapy  Treatment Goals addressed: Coping  Interventions: Motivational Interviewing and Supportive  Summary: Autumn Garcia is a 51 y.o. female who presents with anxiety.  Autumn Garcia looked sad today - she explained that with the fact that she had a meltdown yesterday - it was a stronger one than usual. She felt she had made a mistake and [name] was angry with her.  He denied being angry - he was just serious in his response.  Somehow it brought her back to a time when her mother would be punishing her.  She does not remember much of the day.  She has gone from having seizures every day to only having them once or twice a day.  She feels her diet is helping - she is on a high fat and high protein diet.  The high fat is what is helping - apparently there is such a thing as high fat diets that help lower seizures.  Her dog is now officially and medical alert dog for she is able to predict her seizures and goes for help.  Autumn Garcia has done this officially and now she has a badge and a vest which allows her to take the dog into a lot of places.  She has been painting and learning more about art lately.  This has increased since she is off the Zoloft.    .   Suicidal/Homicidal: Nowithout intent/plan  Plan: Return again in 2 weeks.  Diagnosis: Axis I: Dissociative disorder    Axis II: Deferred    Lambros Cerro,[name], LCSW 06/27/2012

## 2012-07-04 DIAGNOSIS — S92919A Unspecified fracture of unspecified toe(s), initial encounter for closed fracture: Secondary | ICD-10-CM | POA: Diagnosis not present

## 2012-07-04 DIAGNOSIS — M79609 Pain in unspecified limb: Secondary | ICD-10-CM | POA: Diagnosis not present

## 2012-07-04 DIAGNOSIS — L905 Scar conditions and fibrosis of skin: Secondary | ICD-10-CM | POA: Diagnosis not present

## 2012-07-04 DIAGNOSIS — Z0000 Encounter for general adult medical examination without abnormal findings: Secondary | ICD-10-CM | POA: Diagnosis not present

## 2012-07-04 DIAGNOSIS — Z13220 Encounter for screening for lipoid disorders: Secondary | ICD-10-CM | POA: Diagnosis not present

## 2012-07-04 DIAGNOSIS — E559 Vitamin D deficiency, unspecified: Secondary | ICD-10-CM | POA: Diagnosis not present

## 2012-07-04 LAB — LIPID PANEL
Cholesterol: 208 mg/dL — AB (ref 0–200)
HDL: 69 mg/dL (ref 35–70)
LDL Cholesterol: 128 mg/dL
Triglycerides: 57 mg/dL (ref 40–160)

## 2012-07-04 LAB — VITAMIN D 1,25 DIHYDROXY: Vit D, 25-Hydroxy: 39.3

## 2012-07-11 ENCOUNTER — Ambulatory Visit (HOSPITAL_COMMUNITY): Payer: Self-pay | Admitting: Licensed Clinical Social Worker

## 2012-07-17 ENCOUNTER — Ambulatory Visit (INDEPENDENT_AMBULATORY_CARE_PROVIDER_SITE_OTHER): Payer: Medicare Other | Admitting: Licensed Clinical Social Worker

## 2012-07-17 DIAGNOSIS — F449 Dissociative and conversion disorder, unspecified: Secondary | ICD-10-CM | POA: Diagnosis not present

## 2012-07-17 NOTE — Progress Notes (Signed)
   THERAPIST PROGRESS NOTE  Session Time: 2:10 - 3:05  Participation Level: Active  Behavioral Response: CasualAlertEuthymic  Type of Therapy: Individual Therapy  Treatment Goals addressed: Anxiety and Coping  Interventions: Motivational Interviewing and Supportive  Summary: Autumn Garcia is a 51 y.o. female who presents with dissociation..She and [name] went to beach for her BD - she actually wanted to go to the mountains but she was happy about getting away.  They stopped in some thrift stores and she found some things that she is excited about.  They have some conflict over her buying things.  They are very inexpensive and she sees inspiration for her painting in them.  He has gone along with her but he does feel that she gets obsessive about too many things.  She gets upset when he is even the slightest bit irritable.   She is working a lot on her art - she is using a new website.  [employer] [email or web address]  She is happy with the results from her diet - the increased fat is lubricating her joints and they do not hurt any more.  Her blood studies are excellent. Her dissociations seem to be triggered by her perceptions that [name] is angry or upset with her.  Discussed how we needed to work on how to deal with feelings in relation to anger - how to not go back to the dangerous feelings of childhood..    Suicidal/Homicidal: Nowithout intent/plan  Plan: Return again in 2 weeks.  Diagnosis: Axis I: Dissociative Disorder    Axis II: Deferred    Autumn Garcia,[name], LCSW 07/17/2012

## 2012-07-29 ENCOUNTER — Ambulatory Visit (HOSPITAL_COMMUNITY): Payer: Self-pay | Admitting: Psychiatry

## 2012-07-30 ENCOUNTER — Ambulatory Visit (HOSPITAL_COMMUNITY): Payer: Self-pay | Admitting: Licensed Clinical Social Worker

## 2012-07-31 ENCOUNTER — Encounter (HOSPITAL_COMMUNITY): Payer: Self-pay | Admitting: Psychiatry

## 2012-07-31 ENCOUNTER — Ambulatory Visit (INDEPENDENT_AMBULATORY_CARE_PROVIDER_SITE_OTHER): Payer: Medicare Other | Admitting: Psychiatry

## 2012-07-31 VITALS — BP 115/75 | Ht 64.0 in | Wt 151.0 lb

## 2012-07-31 DIAGNOSIS — F4481 Dissociative identity disorder: Secondary | ICD-10-CM

## 2012-07-31 DIAGNOSIS — F4310 Post-traumatic stress disorder, unspecified: Secondary | ICD-10-CM | POA: Diagnosis not present

## 2012-07-31 NOTE — Progress Notes (Signed)
   [facility] Initial Outpatient Visit  Autumn Garcia 04-04-1961   Subjective: The patient is a 51 year old female who has been followed by [facility] since December of 2011. She has been seen by Merlene Morse this whole time. I have been treating her since June of 2013. At that time she was diagnosed with PTSD along with disassociative identity disorder and pseudoseizures. At her last appointment, she had stopped her Zoloft. She didn't like how it made her feel. She has continued to take one half Valium at bedtime. She was not brave enough yet to start the Seroquel. She presents today. She continues to hear voices in her head. She states that they can be inside or outside of her head. Sometimes the voice is her mom. She actually will see her physically and argue with her. The patient reports she did not speak to her mother for the 2 years prior to her death. The patient states that she has a fairy sent from another world to make friends with humans. She states that the fairies want to kill the humans. The patient still having nightmares. 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 She has been using one half Valium during the day. Prior to this she can only do one thing at a time. She is able to multitask a little bit better. Filed Vitals:   07/31/12 1421  BP: 115/75    Mental Status Examination  Appearance: Casual, holds teddy bear. Alert: Yes Attention: good  Cooperative: Yes Eye Contact: Good Speech: Hesitant at times, monotone Psychomotor Activity: Decreased Memory/Concentration: Intact Oriented: person, place, time/date and situation Mood: Anxious Affect: Constricted Thought Processes and Associations: Goal Directed Fund of Knowledge: Fair Thought Content: No suicidal or homicidal thoughts Insight: Fair Judgement: Fair  Diagnosis: Posttraumatic stress disorder, disassociative identity disorder, pseudoseizures  Treatment Plan:Patient may continue Valium one half  tablet twice a day as needed. She plans to start Seroquel at 1/8 of a tablet at bedtime. I will see her back in 6 weeks. She may call with concerns. Jamse Mead, MD

## 2012-08-05 DIAGNOSIS — R1031 Right lower quadrant pain: Secondary | ICD-10-CM | POA: Diagnosis not present

## 2012-08-05 DIAGNOSIS — E559 Vitamin D deficiency, unspecified: Secondary | ICD-10-CM | POA: Diagnosis not present

## 2012-08-05 DIAGNOSIS — Z1211 Encounter for screening for malignant neoplasm of colon: Secondary | ICD-10-CM | POA: Diagnosis not present

## 2012-08-05 DIAGNOSIS — A6000 Herpesviral infection of urogenital system, unspecified: Secondary | ICD-10-CM | POA: Diagnosis not present

## 2012-08-05 DIAGNOSIS — R569 Unspecified convulsions: Secondary | ICD-10-CM | POA: Diagnosis not present

## 2012-08-05 DIAGNOSIS — F4310 Post-traumatic stress disorder, unspecified: Secondary | ICD-10-CM | POA: Diagnosis not present

## 2012-08-06 ENCOUNTER — Ambulatory Visit (INDEPENDENT_AMBULATORY_CARE_PROVIDER_SITE_OTHER): Payer: Medicare Other | Admitting: Licensed Clinical Social Worker

## 2012-08-06 DIAGNOSIS — F449 Dissociative and conversion disorder, unspecified: Secondary | ICD-10-CM | POA: Diagnosis not present

## 2012-08-06 NOTE — Progress Notes (Signed)
   THERAPIST PROGRESS NOTE  Session Time: 3:10 - 4:05  Participation Level: Active  Behavioral Response: CasualAlertEuthymic  Type of Therapy: Individual Therapy  Treatment Goals addressed: Anxiety  Interventions: Motivational Interviewing and Supportive  Summary: Autumn Garcia is a 51 y.o. female who presents with dissociations.  [name] reported that she had about one seizure last week.  She does not always know what triggers them.  Her dog has been certified as a Pharmacologist. She knows when [name] is going to have a seizure and she goes to get [name] - she also knows when she is dissociating.  It is like she is highly tuned into [name].  It is a comfort for her.  She discussed her art a lot today and showed counselor a lot of her work.  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  She has sold many works at [employer].  I keep encouraging her to raise her prices.  It is not easy to sell art unless you have a name out there - she will get her name out there for she has talent.  She is learning and focussing on her work.  The main problem that was discussed at the end of the hour was that she gets overly compulsive and she will start working and she won't take a break - it is not that she becomes unaware of time as artists do - she really is thirsty or has to go to the batheroom and she cannot stop working until she gets to a certain point.  Suggested [name] put his arm around her and help her move away or encourage her to drink.  She actually thought that might work     Suicidal/Homicidal: Nowithout intent/plan  Plan: Return again in 2 weeks.  Diagnosis: Axis I: Dissociative Disorder    Axis II: Deferred    Lenward Able,[name], LCSW 08/06/2012

## 2012-08-21 ENCOUNTER — Ambulatory Visit (INDEPENDENT_AMBULATORY_CARE_PROVIDER_SITE_OTHER): Payer: Medicare Other | Admitting: Licensed Clinical Social Worker

## 2012-08-21 DIAGNOSIS — F449 Dissociative and conversion disorder, unspecified: Secondary | ICD-10-CM | POA: Diagnosis not present

## 2012-08-23 ENCOUNTER — Other Ambulatory Visit: Payer: Self-pay | Admitting: Family Medicine

## 2012-08-23 DIAGNOSIS — R109 Unspecified abdominal pain: Secondary | ICD-10-CM

## 2012-08-26 NOTE — Progress Notes (Signed)
   THERAPIST PROGRESS NOTE  Session Time: 2:10 - 3:00  Participation Level: Active  Behavioral Response: CasualAlertEuthymic  Type of Therapy: Individual Therapy  Treatment Goals addressed: Anxiety  Interventions: Motivational Interviewing and Supportive  Summary: Jhada Risk is a 51 y.o. female who presents with anxiety. [name] reports that Seroquel does not doing anything for her.  It is just making her groggy and sleep too much in the daytime.  Does not have any effect on her dreams or sleep. She stated Dr. Christell Constant said she could discontinue and she believes she will - she did increase it as directed.  She keeps hoping some medication might help but it is not happening yet.  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  She is learning a lot and has found another web site that is helpful with the business end of her art work.  She also discussed some movies that she enjoys and they give her a positive feeling   They are animated movies with adult messages -  Despicable Me, Epic, How to Train a Nurse, children's and others.  They do not trigger night mares and they make her smile..   Suicidal/Homicidal: Nowithout intent/plan  Therapist Response: [name] is doing better but it is hard to get her past trauma to not effect her so much.  Plan to discuss the possibility of using EMDR as a way to put her trauma behind in her brain and not be so present with her now.  Have someone that I could refer her to.  Plan: Return again in 2 weeks.  Diagnosis: Axis I: Dissociativ Disorder    Axis II: Deferred    Aydeen Blume,[name], LCSW 08/26/2012

## 2012-08-28 ENCOUNTER — Ambulatory Visit (INDEPENDENT_AMBULATORY_CARE_PROVIDER_SITE_OTHER): Payer: Medicare Other

## 2012-08-28 DIAGNOSIS — N200 Calculus of kidney: Secondary | ICD-10-CM | POA: Diagnosis not present

## 2012-08-28 DIAGNOSIS — R109 Unspecified abdominal pain: Secondary | ICD-10-CM

## 2012-08-28 IMAGING — CT CT ABD-PELV W/O CM
4 of 8 series · 13 of 46 positions shown, 19 images · non-contrast
Comparison: [DATE]

CLINICAL DATA: Query incisional hernia and right nephrectomy site.

CT ABDOMEN AND PELVIS WITHOUT CONTRAST
TECHNIQUE: Multidetector CT imaging of the abdomen and pelvis was
performed following the standard protocol without intravenous
contrast.

[Series 2: abd/pelvis without · axial · non-contrast · 0.72mm/px · z∈[-298,-88]mm · 3 of 84 slices shown, 7 images]
[im 21/84  soft-tissue]
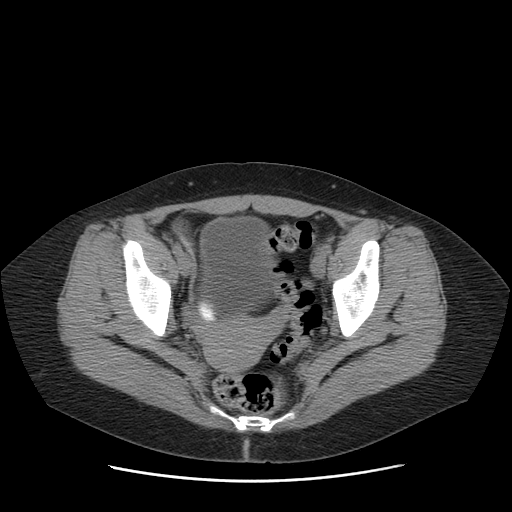
[im 21/84  lung]
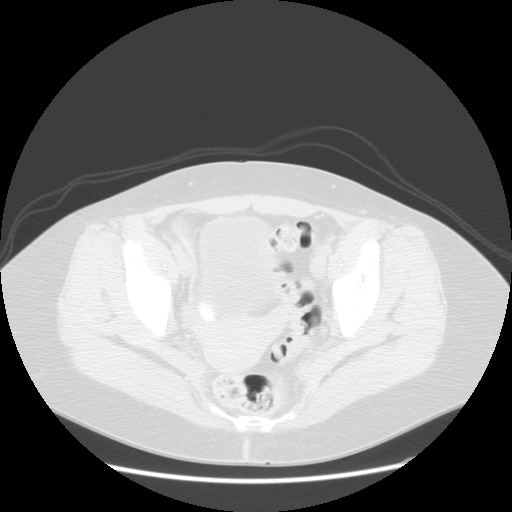
[im 21/84  bone]
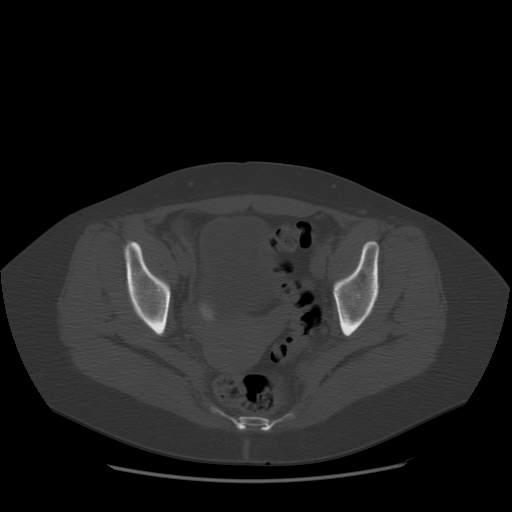
[im 42/84  soft-tissue]
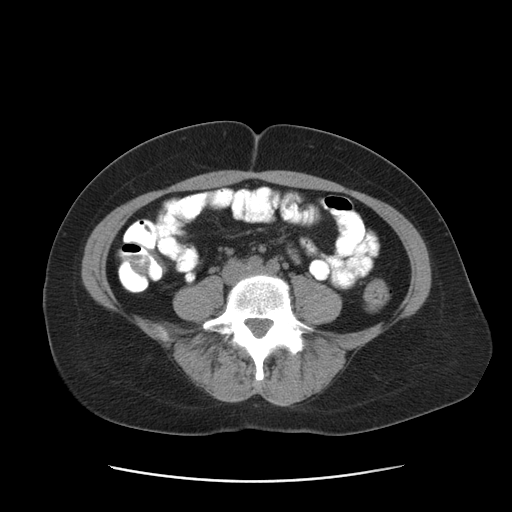
[im 42/84  lung]
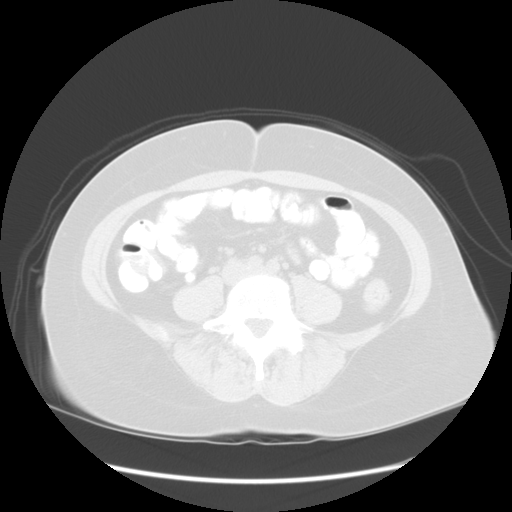
[im 63/84  soft-tissue]
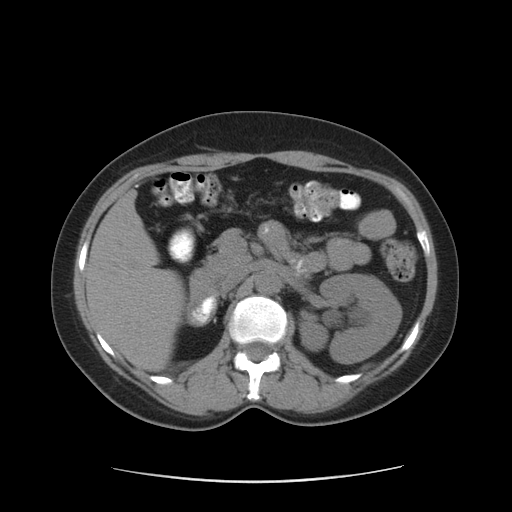
[im 63/84  lung]
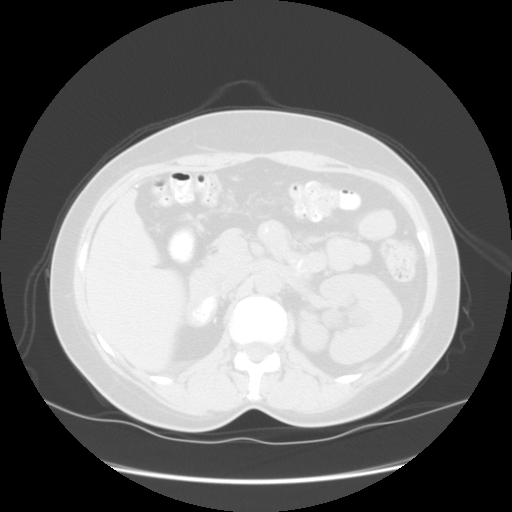

[Series 200: sagittal · sagittal · 0.88mm/px · 2 of 154 slices shown, 3 images (1 of 2)]
[im 52/154  soft-tissue]
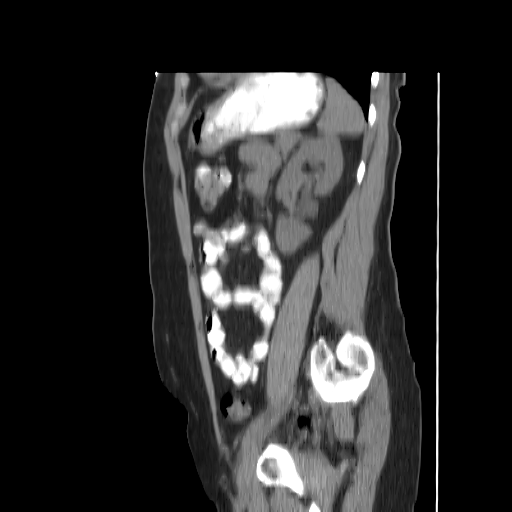
[im 52/154  bone]
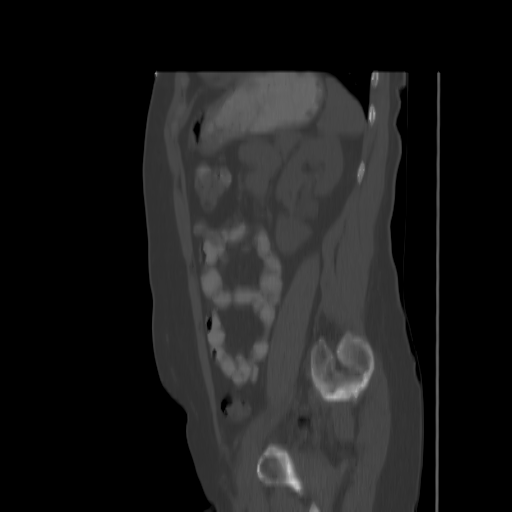
[im 103/154  soft-tissue]
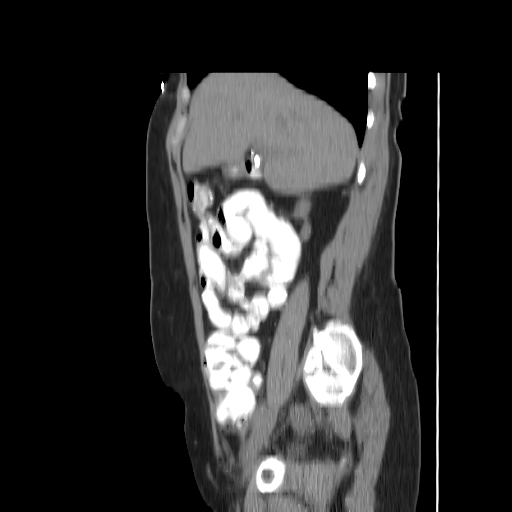

[Series 201: coronal · sagittal · 0.88mm/px · 7 of 164 slices shown]
[im 17/164  soft-tissue]
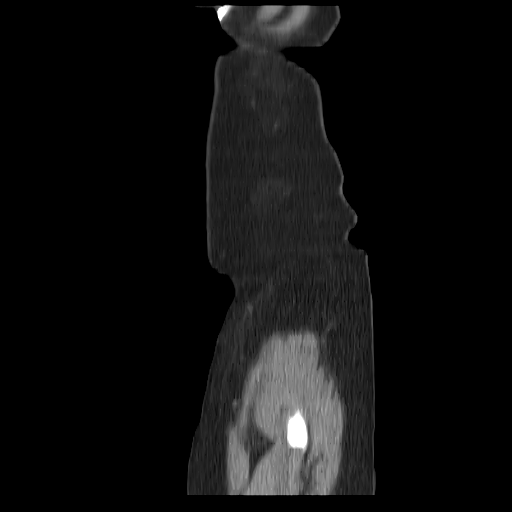
[im 33/164  soft-tissue]
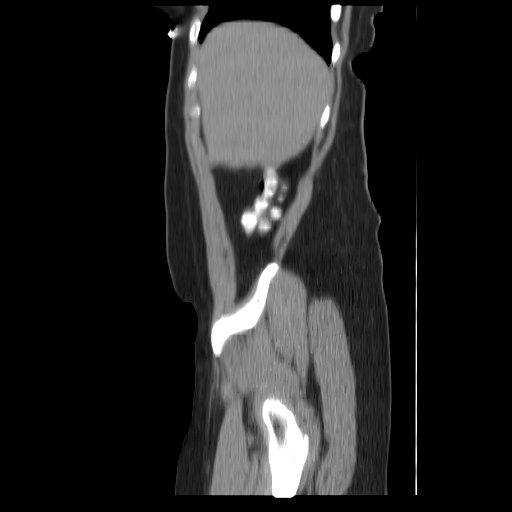
[im 49/164  soft-tissue]
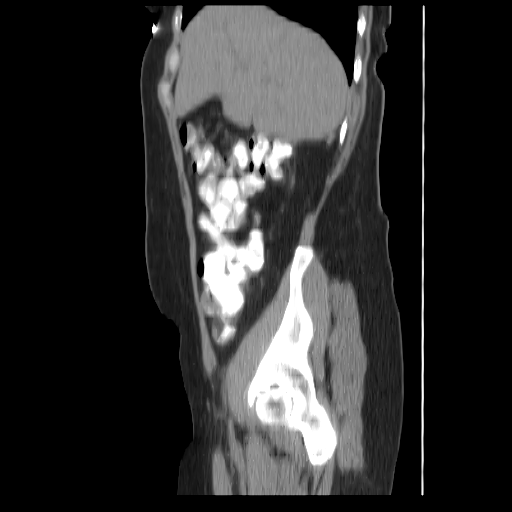
[im 66/164  soft-tissue]
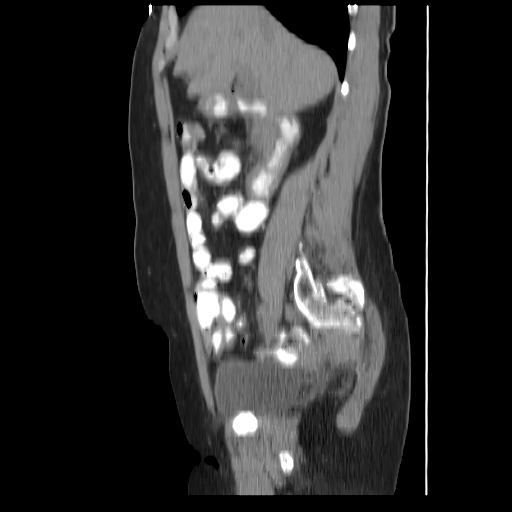
[im 98/164  soft-tissue]
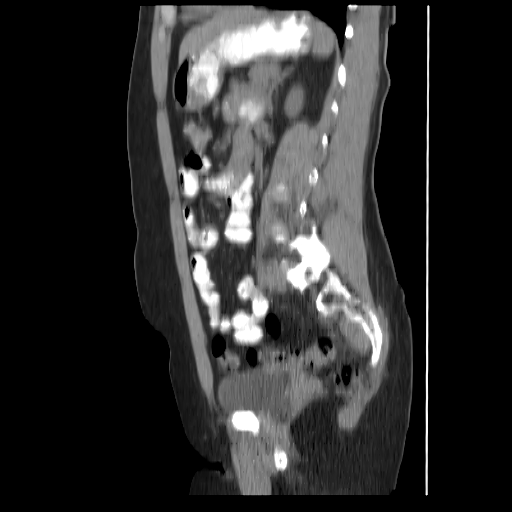
[im 115/164  soft-tissue]
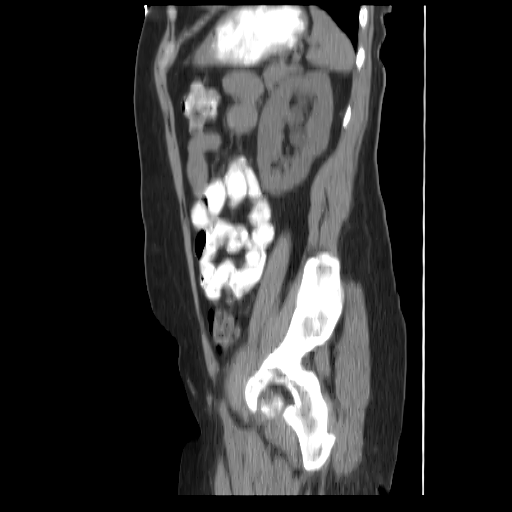
[im 131/164  soft-tissue]
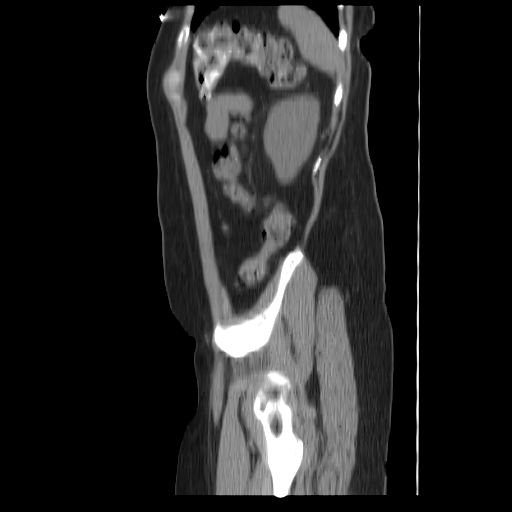

[Series 203: sagittal · sagittal · 0.88mm/px · 1 of 136 slices shown, 2 images (2 of 2)]
[im 46/136  soft-tissue]
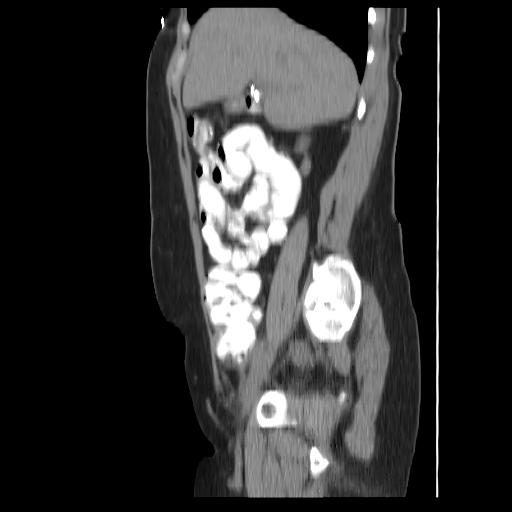
[im 46/136  bone]
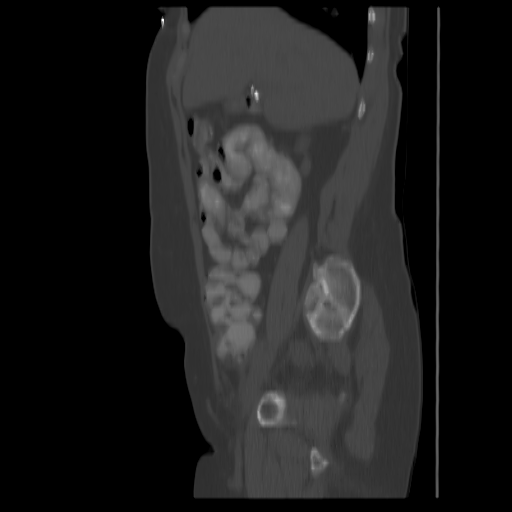

[13 of 46 positions shown; findings below may reference images not displayed]

FINDINGS: Interval right nephrectomy noted without visible hernia.
An oval shaped soft tissue density along the anterior renal fascial
margin measures 0.9 cm in short axis and probably represents a
lymph node.

Several punctate the left kidney upper pole nonobstructive calculi
are present along with small left peripelvic cysts.  No left
hydronephrosis, hydroureter, or ureteral calculus observed.
Compensatory hypertrophy of the left kidney noted.

Noncontrast CT appearance of the visualized portion of the liver,
spleen, pancreas, and adrenal glands normal.  Borderline duodenal
wall thickening noted in the transverse duodenum as on image 33 of
series 2.  No dilated bowel noted.  Appendix normal.  Gallbladder
absent.

The uterus and adnexa appear unremarkable.

Urinary bladder unremarkable.

Synovial herniation pit noted anteriorly along the junction of the
left femoral head and neck.
IMPRESSION: 1.  Punctate nonobstructive left renal calculi.
2.  No herniation at the site of the prior right nephrectomy.
Borderline prominent lymph node noted along the right anterior
pararenal fascia.
3.  Mild wall thickening in the descending transverse duodenum -
duodenitis not excluded.

## 2012-09-03 ENCOUNTER — Ambulatory Visit (INDEPENDENT_AMBULATORY_CARE_PROVIDER_SITE_OTHER): Payer: Medicare Other | Admitting: Licensed Clinical Social Worker

## 2012-09-03 DIAGNOSIS — F449 Dissociative and conversion disorder, unspecified: Secondary | ICD-10-CM

## 2012-09-03 NOTE — Progress Notes (Signed)
   THERAPIST PROGRESS NOTE  Session Time: 2:00 - 3:00  Participation Level: Active  Behavioral Response: CasualAlertEuthymic  Type of Therapy: Individual Therapy  Treatment Goals addressed: Anxiety  Interventions: Motivational Interviewing and Supportive  Summary: Autumn Garcia is a 52 y.o. female who presents with a pleasant mood today.  She reports that she was not as productive this week. 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  They cause her more anxiety.  She an [name] continue to do well together..  She has stopped the Seroquel for she was not feeling right on it.  Did not help at all.  They had a quiet Thanksgiving together.  .She continues her pursuit of her art and is happy with her chosen work taking off so well  She is learning a lot.  She get scompuslive in her learning process - she does not let go - until she has learned it deeply.     Suicidal/Homicidal: Nowithout intent/plan  Therapist Response: She has expressed that talking about her art with me is her therapy.  She claims it helps her a lot - it was something she could never do with her mother.  Plan: Return again in 2 weeks.  Diagnosis: Axis I: Dissociative disorder    Axis II: Deferred    Pari Lombard,[name], LCSW 09/03/2012

## 2012-09-16 ENCOUNTER — Ambulatory Visit (INDEPENDENT_AMBULATORY_CARE_PROVIDER_SITE_OTHER): Payer: Medicare Other | Admitting: Psychiatry

## 2012-09-16 ENCOUNTER — Encounter (HOSPITAL_COMMUNITY): Payer: Self-pay | Admitting: Psychiatry

## 2012-09-16 ENCOUNTER — Other Ambulatory Visit (HOSPITAL_COMMUNITY): Payer: Self-pay | Admitting: Psychiatry

## 2012-09-16 VITALS — BP 112/72 | Ht 64.0 in | Wt 151.0 lb

## 2012-09-16 DIAGNOSIS — R569 Unspecified convulsions: Secondary | ICD-10-CM | POA: Diagnosis not present

## 2012-09-16 DIAGNOSIS — F4481 Dissociative identity disorder: Secondary | ICD-10-CM

## 2012-09-16 DIAGNOSIS — F445 Conversion disorder with seizures or convulsions: Secondary | ICD-10-CM

## 2012-09-16 DIAGNOSIS — F4310 Post-traumatic stress disorder, unspecified: Secondary | ICD-10-CM | POA: Diagnosis not present

## 2012-09-16 MED ORDER — DIAZEPAM 2 MG PO TABS: 1.0000 mg | ORAL_TABLET | Freq: Three times a day (TID) | ORAL | Status: DC | PRN

## 2012-09-16 NOTE — Progress Notes (Signed)
   [facility] Initial Outpatient Visit  Autumn Garcia September 09, 1961   Subjective: The patient is a 51 year old female who has been followed by [facility] since December of 2011. She has been seen by Merlene Morse this whole time. I have been treating her since June of 2013. At that time she was diagnosed with PTSD along with disassociative identity disorder and pseudoseizures. At her last appointment, the patient continue her Valium and was going to try the Seroquel prescribed. She presents today with her husband. She actually took the Seroquel for one month. She worked her way up to one half tablet daily. She felt that it made her groggy and disassociative more. She has been off it for a few weeks. The patient feels the Valium helps more than anything. She only takes 1 mg twice a day. There are still times that she has breakthrough anxiety. She is having less seizures since on the Valium. Her husband still sees her as zoning out some. She endorses good sleep and appetite. She still having nightmares. Filed Vitals:   09/16/12 1355  BP: 112/72    Mental Status Examination  Appearance: Casual, holds teddy bear. Alert: Yes Attention: good  Cooperative: Yes Eye Contact: Good Speech: Hesitant at times, monotone Psychomotor Activity: Decreased Memory/Concentration: Intact Oriented: person, place, time/date and situation Mood: Anxious Affect: Constricted Thought Processes and Associations: Goal Directed Fund of Knowledge: Fair Thought Content: No suicidal or homicidal thoughts Insight: Fair Judgement: Fair  Diagnosis: Posttraumatic stress disorder, disassociative identity disorder, pseudoseizures  Treatment Plan: Him to 1 mg 3 times a day as needed. I will see the patient back in 2 months. Patient may call with concerns. Jamse Mead, MD

## 2012-09-17 ENCOUNTER — Ambulatory Visit (HOSPITAL_COMMUNITY): Payer: Self-pay | Admitting: Licensed Clinical Social Worker

## 2012-09-18 ENCOUNTER — Ambulatory Visit (INDEPENDENT_AMBULATORY_CARE_PROVIDER_SITE_OTHER): Payer: Medicare Other | Admitting: Licensed Clinical Social Worker

## 2012-09-18 DIAGNOSIS — F449 Dissociative and conversion disorder, unspecified: Secondary | ICD-10-CM | POA: Diagnosis not present

## 2012-09-18 NOTE — Progress Notes (Signed)
   THERAPIST PROGRESS NOTE  Session Time: 2:05 - 3:00  Participation Level: Active  Behavioral Response: CasualAlertEuthymic  Type of Therapy: Individual Therapy  Treatment Goals addressed: Anxiety  Interventions: Motivational Interviewing and Supportive  Summary: Autumn Garcia is a 51 y.o. female who presents with anxiety.  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  She is going with a friend to a pizza party - which is being given as a going away party.  It is the first time she does not feel scared.  The party was interesting - observing people a lot.  Did not trigger anxiety which was a positive.  She feels it had to do with trusting her friend.  She finds she deals with situations better if she is with someone she trusts.  With [name] it means that sometimes it sis the level of trust where she can let her fears and anxieties show.  Discussed how the more positive experiences that she has the stronger she is going to feel.   Suicidal/Homicidal: No  Plan: Return again in 2 weeks.  Diagnosis: Axis I: Dissociative Identity Disorder    Axis II: Deferred    Autumn Garcia,[name], LCSW 09/18/2012

## 2012-09-19 ENCOUNTER — Telehealth (HOSPITAL_COMMUNITY): Payer: Self-pay

## 2012-09-19 NOTE — Telephone Encounter (Signed)
Spoke to the patient and to pharmacy. Pharmacy will switch out 10 mg  for 2 mg. Patient is okay.

## 2012-09-19 NOTE — Telephone Encounter (Signed)
PT PICKED UP VALIUM RX AND TOOK ONE YESTERDAY AND THEN COULDN'T STAND AFTERWARDS. HAS LOOKED AT THE BOTTLE AND THE RX WAS FOR 10MG  SHE NORMALLY TAKES 2MG .

## 2012-10-03 ENCOUNTER — Ambulatory Visit (INDEPENDENT_AMBULATORY_CARE_PROVIDER_SITE_OTHER): Payer: Medicare Other | Admitting: Licensed Clinical Social Worker

## 2012-10-03 DIAGNOSIS — F449 Dissociative and conversion disorder, unspecified: Secondary | ICD-10-CM

## 2012-10-08 NOTE — Progress Notes (Signed)
   THERAPIST PROGRESS NOTE  Session Time: 2:05 - 3:00  Participation Level: Active  Behavioral Response: CasualAlertEuthymic  Type of Therapy: Individual Therapy  Treatment Goals addressed: Anxiety  Interventions: Motivational Interviewing  Summary: Miah Boye is a 52 y.o. female who presents with pleasant mood.  She and [name] had a quiet holiday - the way they like it.  [name] gave her some art supplies that she wanted.  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  Her mother never appreciated her art and that was her mother = no one could be better than her.  She did not do art.and was not encouraging with [place]nie with her art.  So it is important to her that we talk about her art and she shows me what she has done.  She did get into some of the cruelty of her mother.  Holidays were always tense and unpredictable - for if things did not turn out perfect she got violent with her temper. Somehow it was always her fault.  She reports how she was always grounded. It took very little for her to get grounded which prevented her from doing much that was fun..   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  Plan: Return again in 2 weeks.  Diagnosis: Axis I: Dissociative disorder    Axis II: Deferred    Shoni Quijas,[name], LCSW 10/08/2012

## 2012-10-17 ENCOUNTER — Ambulatory Visit (INDEPENDENT_AMBULATORY_CARE_PROVIDER_SITE_OTHER): Payer: Medicare Other | Admitting: Licensed Clinical Social Worker

## 2012-10-17 DIAGNOSIS — F4481 Dissociative identity disorder: Secondary | ICD-10-CM | POA: Diagnosis not present
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
## 2012-10-24 ENCOUNTER — Ambulatory Visit (INDEPENDENT_AMBULATORY_CARE_PROVIDER_SITE_OTHER): Payer: Medicare Other | Admitting: Obstetrics & Gynecology

## 2012-10-24 ENCOUNTER — Other Ambulatory Visit (HOSPITAL_COMMUNITY)
Admission: RE | Admit: 2012-10-24 | Discharge: 2012-10-24 | Disposition: A | Discharge status: Home / Self Care | Payer: Medicare Other | Source: Ambulatory Visit | Attending: Obstetrics & Gynecology | Admitting: Obstetrics & Gynecology

## 2012-10-24 ENCOUNTER — Encounter: Payer: Self-pay | Admitting: Obstetrics & Gynecology

## 2012-10-24 VITALS — BP 109/70 | HR 59 | Resp 16 | Ht 64.0 in | Wt 152.0 lb

## 2012-10-24 DIAGNOSIS — Z1151 Encounter for screening for human papillomavirus (HPV): Secondary | ICD-10-CM | POA: Insufficient documentation

## 2012-10-24 DIAGNOSIS — Z124 Encounter for screening for malignant neoplasm of cervix: Secondary | ICD-10-CM | POA: Insufficient documentation

## 2012-10-24 DIAGNOSIS — N950 Postmenopausal bleeding: Secondary | ICD-10-CM

## 2012-10-24 DIAGNOSIS — N841 Polyp of cervix uteri: Secondary | ICD-10-CM
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
## 2012-10-29 ENCOUNTER — Encounter: Payer: Self-pay | Admitting: Obstetrics & Gynecology

## 2012-10-30 ENCOUNTER — Ambulatory Visit (HOSPITAL_COMMUNITY): Payer: Self-pay | Admitting: Licensed Clinical Social Worker

## 2012-11-04 ENCOUNTER — Ambulatory Visit (INDEPENDENT_AMBULATORY_CARE_PROVIDER_SITE_OTHER): Payer: Medicare Other

## 2012-11-04 DIAGNOSIS — N950 Postmenopausal bleeding: Secondary | ICD-10-CM

## 2012-11-04 DIAGNOSIS — D2370 Other benign neoplasm of skin of unspecified lower limb, including hip: Secondary | ICD-10-CM | POA: Diagnosis not present

## 2012-11-04 DIAGNOSIS — Z85828 Personal history of other malignant neoplasm of skin: Secondary | ICD-10-CM | POA: Diagnosis not present

## 2012-11-04 DIAGNOSIS — D2360 Other benign neoplasm of skin of unspecified upper limb, including shoulder: Secondary | ICD-10-CM | POA: Diagnosis not present

## 2012-11-04 IMAGING — US US PELVIS COMPLETE
1 series · 13 of 25 positions shown · non-contrast
Comparison: None.

CLINICAL DATA: Post menopausal bleeding.



[Series 1: us pelvis complete · 0.22mm/px · 13 of 37 slices shown]
[im 1/37]
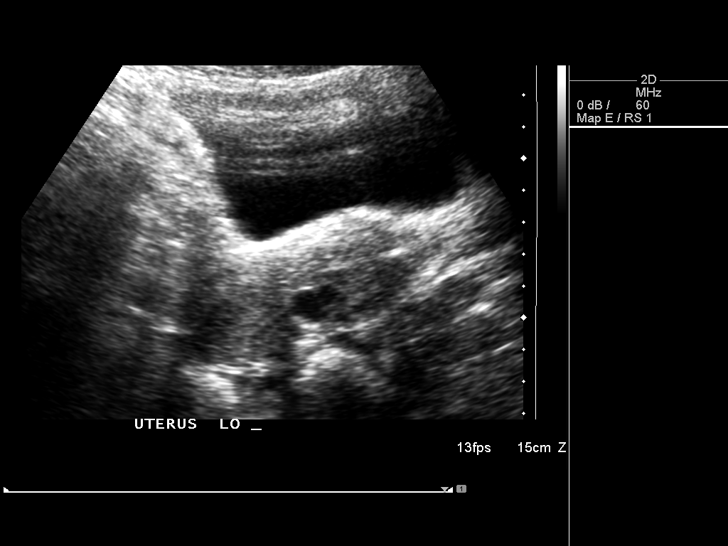
[im 4/37]
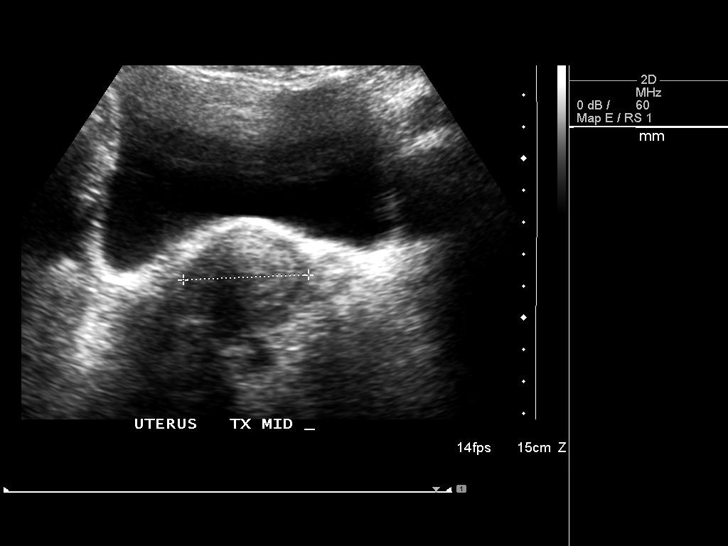
[im 7/37]
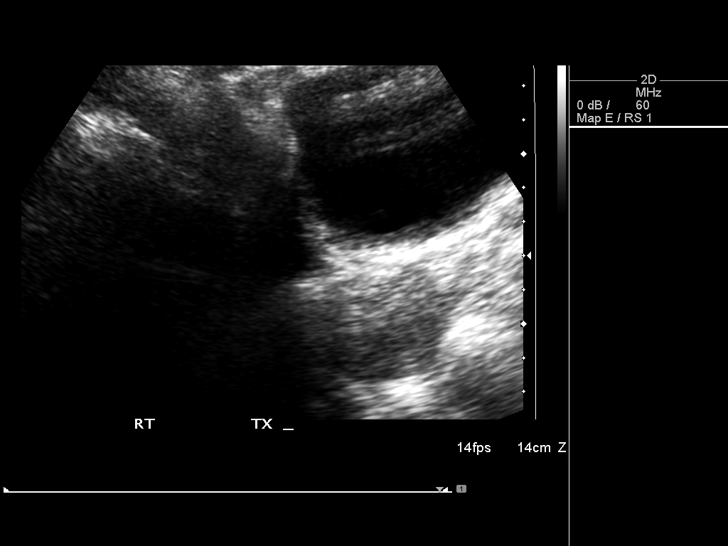
[im 10/37]
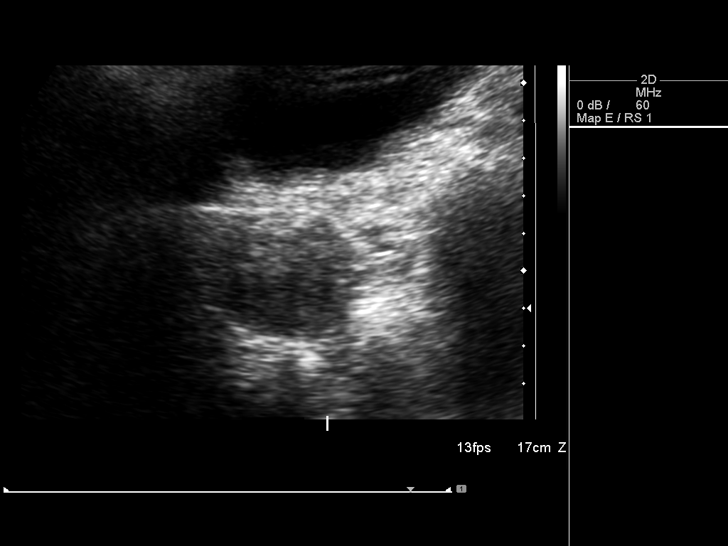
[im 13/37]
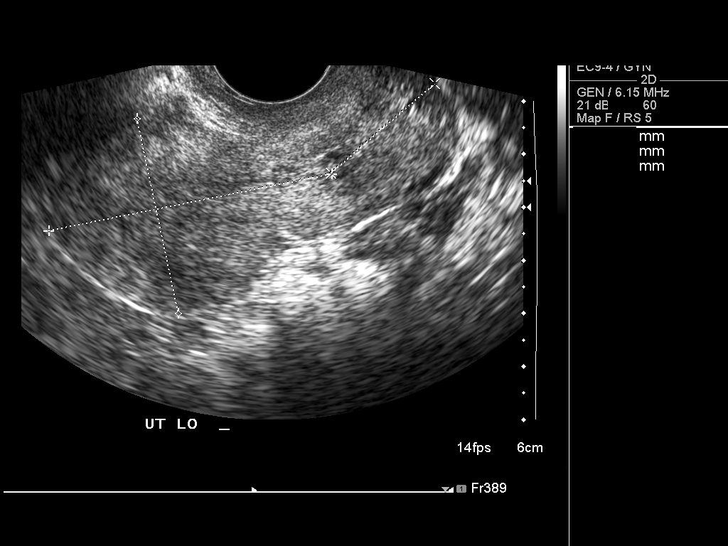
[im 16/37]
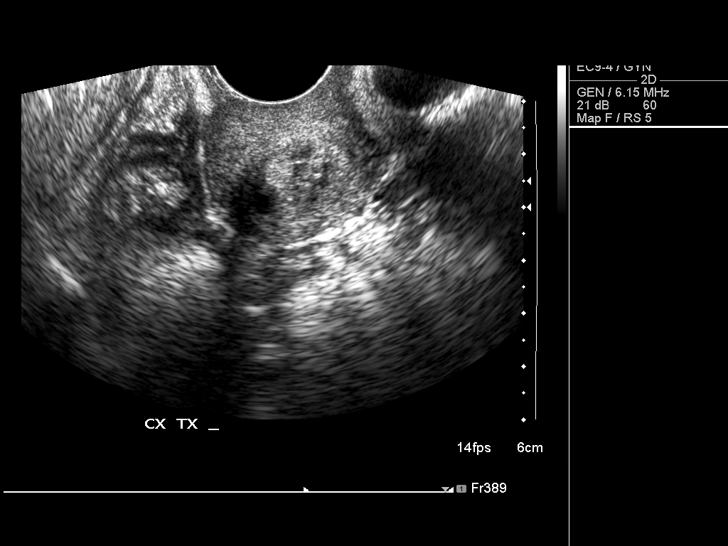
[im 19/37]
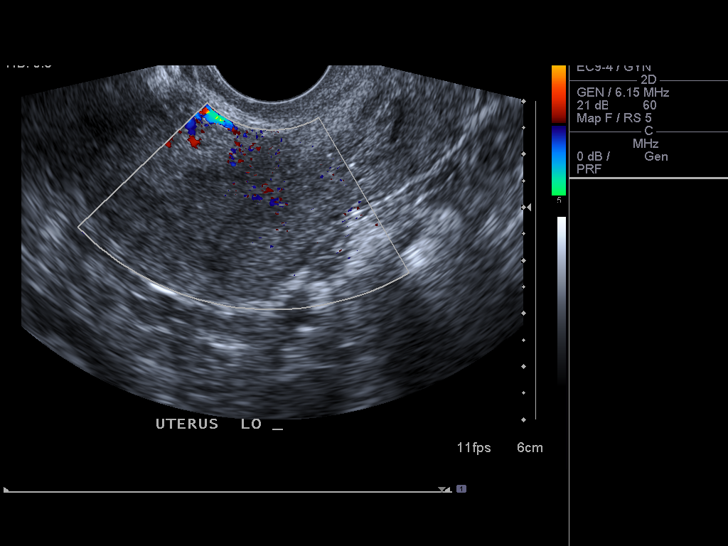
[im 22/37]
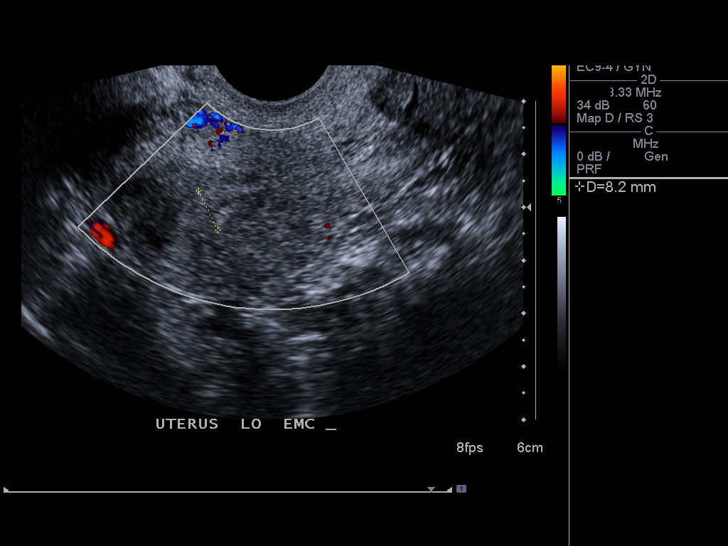
[im 25/37]
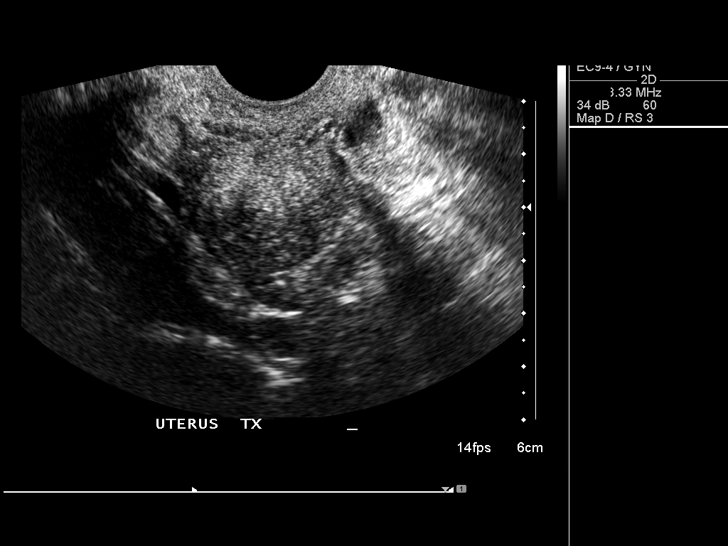
[im 28/37]
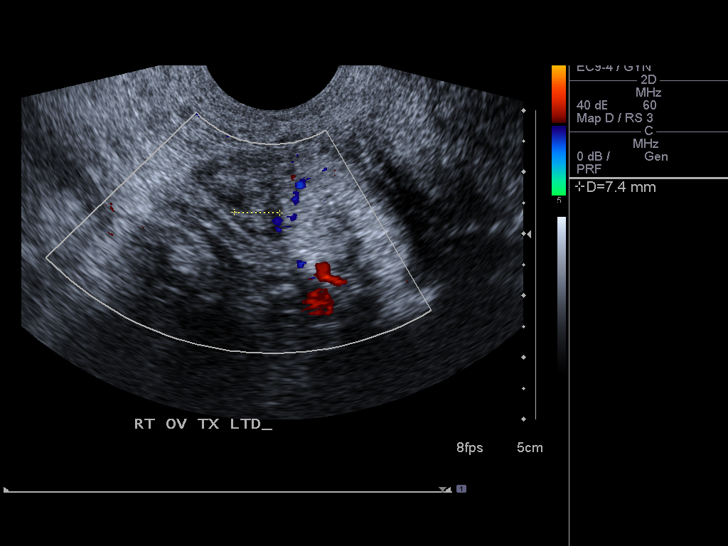
[im 31/37]
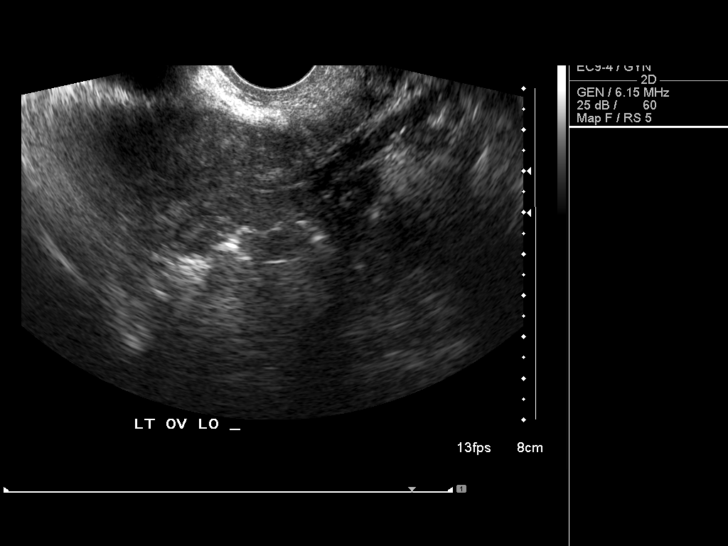
[im 34/37]
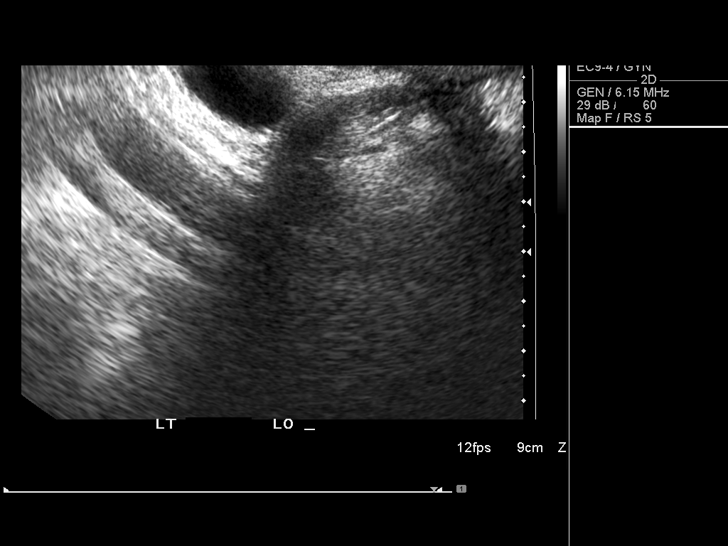
[im 37/37]
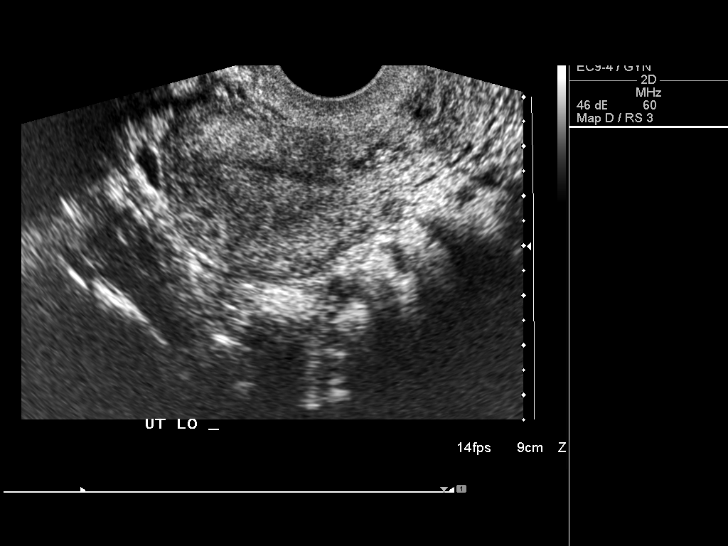

[13 of 25 positions shown; findings below may reference images not displayed]

FINDINGS: Uterus:  8.0 x 3.8 x 4.4 cm.  No fibroids or other uterine mass
identified.

Endometrium: Double layer thickness measures 12 mm transvaginally.
No focal lesion visualized.

Right ovary: 1.2 x 0.9 x 0.7 cm.  Normal postmenopausal appearance.
No adnexal mass identified.

Left ovary: 1.8 x 0.9 x 1.3 cm.  Normal postmenopausal appearance.
No adnexal mass identified.

Other Findings:  No free fluid
IMPRESSION: 1.  Endometrial thickness measures 12 mm.  In the setting of post-
menopausal bleeding, endometrial sampling is indicated to exclude
carcinoma.  If results are benign, sonohysterogram should be
considered for focal lesion work-up. (Ref:  Radiological Reasoning:
Algorithmic Workup of Abnormal Vaginal Bleeding with Endovaginal
Sonography and Sonohysterography. AJR [SS]; [id number])
2.  Normal postmenopausal appearance of both ovaries.

## 2012-11-05 ENCOUNTER — Telehealth: Payer: Self-pay | Admitting: *Deleted

## 2012-11-05 DIAGNOSIS — Z01818 Encounter for other preprocedural examination: Secondary | ICD-10-CM

## 2012-11-05 MED ORDER — MISOPROSTOL 200 MCG PO TABS: ORAL_TABLET | ORAL | Status: DC

## 2012-11-05 MED ORDER — IBUPROFEN 600 MG PO TABS: 600.0000 mg | ORAL_TABLET | Freq: Four times a day (QID) | ORAL | Status: DC | PRN

## 2012-11-05 NOTE — Telephone Encounter (Signed)
Pt notified of lab results and that she is scheduled for Endometrial biopsy with RX for Cytotec sent to pharmacy.

## 2012-11-12 ENCOUNTER — Other Ambulatory Visit: Payer: Self-pay | Admitting: Obstetrics & Gynecology

## 2012-11-13 ENCOUNTER — Ambulatory Visit (HOSPITAL_COMMUNITY): Payer: Self-pay | Admitting: Licensed Clinical Social Worker

## 2012-11-18 ENCOUNTER — Ambulatory Visit (HOSPITAL_COMMUNITY): Payer: Self-pay | Admitting: Psychiatry

## 2012-11-20 ENCOUNTER — Ambulatory Visit (INDEPENDENT_AMBULATORY_CARE_PROVIDER_SITE_OTHER): Payer: Medicare Other | Admitting: Licensed Clinical Social Worker

## 2012-11-20 DIAGNOSIS — F449 Dissociative and conversion disorder, unspecified: Secondary | ICD-10-CM

## 2012-11-21 ENCOUNTER — Encounter (HOSPITAL_COMMUNITY): Payer: Self-pay | Admitting: Psychiatry

## 2012-11-21 ENCOUNTER — Ambulatory Visit (INDEPENDENT_AMBULATORY_CARE_PROVIDER_SITE_OTHER): Payer: Medicare Other | Admitting: Psychiatry

## 2012-11-21 VITALS — BP 118/78 | Ht 64.0 in | Wt 151.0 lb

## 2012-11-21 DIAGNOSIS — F4481 Dissociative identity disorder: Secondary | ICD-10-CM

## 2012-11-21 DIAGNOSIS — F4310 Post-traumatic stress disorder, unspecified: Secondary | ICD-10-CM

## 2012-11-21 DIAGNOSIS — F445 Conversion disorder with seizures or convulsions: Secondary | ICD-10-CM

## 2012-11-21 DIAGNOSIS — R569 Unspecified convulsions: Secondary | ICD-10-CM

## 2012-11-21 MED ORDER — DIAZEPAM 2 MG PO TABS: 2.0000 mg | ORAL_TABLET | Freq: Three times a day (TID) | ORAL | Status: DC | PRN
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
## 2012-11-26 ENCOUNTER — Ambulatory Visit (INDEPENDENT_AMBULATORY_CARE_PROVIDER_SITE_OTHER): Payer: Medicare Other | Admitting: Licensed Clinical Social Worker

## 2012-11-26 DIAGNOSIS — F449 Dissociative and conversion disorder, unspecified: Secondary | ICD-10-CM | POA: Diagnosis not present
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
## 2012-11-28 ENCOUNTER — Ambulatory Visit: Payer: Self-pay | Admitting: Obstetrics & Gynecology
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
## 2012-12-11 ENCOUNTER — Ambulatory Visit (HOSPITAL_COMMUNITY): Payer: Medicare Other | Admitting: Licensed Clinical Social Worker

## 2012-12-19 ENCOUNTER — Ambulatory Visit (HOSPITAL_COMMUNITY): Payer: Self-pay | Admitting: Psychiatry

## 2012-12-24 ENCOUNTER — Ambulatory Visit (INDEPENDENT_AMBULATORY_CARE_PROVIDER_SITE_OTHER): Payer: Medicare Other | Admitting: Licensed Clinical Social Worker

## 2012-12-24 DIAGNOSIS — F4481 Dissociative identity disorder: Secondary | ICD-10-CM
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
## 2013-01-07 ENCOUNTER — Ambulatory Visit (INDEPENDENT_AMBULATORY_CARE_PROVIDER_SITE_OTHER): Payer: Medicare Other | Admitting: Licensed Clinical Social Worker

## 2013-01-07 DIAGNOSIS — F4481 Dissociative identity disorder: Secondary | ICD-10-CM

## 2013-01-08 ENCOUNTER — Encounter (HOSPITAL_COMMUNITY): Payer: Self-pay | Admitting: Psychiatry

## 2013-01-09 ENCOUNTER — Encounter (HOSPITAL_COMMUNITY): Payer: Self-pay | Admitting: Psychiatry

## 2013-01-09 ENCOUNTER — Ambulatory Visit (INDEPENDENT_AMBULATORY_CARE_PROVIDER_SITE_OTHER): Payer: Medicare Other | Admitting: Psychiatry

## 2013-01-09 VITALS — BP 103/62 | Ht 64.0 in | Wt 151.0 lb

## 2013-01-09 DIAGNOSIS — F4481 Dissociative identity disorder: Secondary | ICD-10-CM | POA: Diagnosis not present

## 2013-01-09 DIAGNOSIS — R569 Unspecified convulsions: Secondary | ICD-10-CM | POA: Diagnosis not present

## 2013-01-09 DIAGNOSIS — F4310 Post-traumatic stress disorder, unspecified: Secondary | ICD-10-CM | POA: Diagnosis not present

## 2013-01-09 DIAGNOSIS — F445 Conversion disorder with seizures or convulsions: Secondary | ICD-10-CM

## 2013-01-09 NOTE — Progress Notes (Signed)
[facility] Initial Outpatient Visit  Autumn Garcia 1961/02/02   Subjective: The patient is a 52 year old female who has been followed by [facility] since December of 2011. She has been seen by Merlene Morse this whole time. I have been treating her since June of 2013. At that time she was diagnosed with PTSD along with disassociative identity disorder and pseudoseizures. At her last appointment, I increased her Valium to 2 mg 3 times a day. The patient presents today late. She is actually having a seizure in the waiting room prior to the appointment. She is slow to come out of it. Her husband reports she has had for the last week. When she has been, she is tired for the rest of the day. The patient is been taking her Valium twice a day. She did not take it this morning. She finds her OCD type symptoms are improved with the Valium. There times that she will check her and e-mailed 50 times within 5 minutes. This is no longer a problem. The patient is interactive by the end of the appointment. She endorses good sleep and appetite. She traveled with her husband last week which was kind of difficulties since he had to work a lot. She was in the room alone. She did have Chloe with her. Filed Vitals:   01/09/13 1355  BP: 103/62   Active Ambulatory Problems    Diagnosis Date Noted  . CANDIDIASIS, ORAL 10/15/2009  . ANXIETY DISORDER, GENERALIZED 06/14/2009  . ACUTE PHARYNGITIS 05/15/2010  . URI 10/15/2009  . ACUTE CYSTITIS 01/26/2010  . HOT FLASHES 09/03/2009  . CELLULITIS, RIGHT LEG 03/18/2010  . KNEE PAIN 01/26/2009  . Other convulsions 01/26/2009  . SKIN RASH 01/26/2009  . Dissociative identity disorder 10/06/2011  . PTSD (post-traumatic stress disorder) 11/15/2011  . Skin cancer of forehead 11/15/2011  . Dissociative disorder 06/03/2012   Resolved Ambulatory Problems    Diagnosis Date Noted  . No Resolved Ambulatory Problems   Past Medical History   Diagnosis Date  . Anxiety   . Depression   . Chronic kidney disease   . HSV infection   . TB (tuberculosis)   . Epilepsy    Current Outpatient Prescriptions on File Prior to Visit  Medication Sig Dispense Refill  . diazepam (VALIUM) 2 MG tablet Take 1 tablet (2 mg total) by mouth 3 (three) times daily as needed for anxiety.  90 tablet  2  . ibuprofen (ADVIL,MOTRIN) 600 MG tablet Take 1 tablet (600 mg total) by mouth every 6 (six) hours as needed for pain.  30 tablet  0  . misoprostol (CYTOTEC) 200 MCG tablet Pt to take 600 mcg PO the night prior to precedure  3 tablet  0  . valACYclovir (VALTREX) 500 MG tablet        No current facility-administered medications on file prior to visit.   Review of Systems - General ROS: negative for - sleep disturbance or weight gain Psychological ROS: positive for - anxiety Cardiovascular ROS: no chest pain or dyspnea on exertion Neurological ROS: positive for - seizures  Mental Status Examination  Appearance: Casual, holds teddy bear. Alert: Yes Attention: good  Cooperative: Yes Eye Contact: Good Speech: Hesitant at times, monotone Psychomotor Activity: Decreased Memory/Concentration: Intact Oriented: person, place, time/date and situation Mood: Anxious Affect: Constricted Thought Processes and Associations: Goal Directed Fund of Knowledge: Fair Thought Content: No suicidal or homicidal thoughts Insight: Fair Judgement: Fair  Diagnosis: Posttraumatic stress disorder, disassociative identity disorder,  pseudoseizures  Treatment Plan: I will continue Valium at 2 mg 3 times a day as needed. I will see the patient back in 2 months. Patient may call with concerns. Jamse Mead, MD
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
## 2013-01-21 ENCOUNTER — Ambulatory Visit (HOSPITAL_COMMUNITY): Payer: Medicare Other | Admitting: Licensed Clinical Social Worker

## 2013-01-21 DIAGNOSIS — F449 Dissociative and conversion disorder, unspecified: Secondary | ICD-10-CM

## 2013-01-22 NOTE — Progress Notes (Unsigned)
   THERAPIST PROGRESS NOTE  Session Time: 4:10 - 5:00  Participation Level: Active  Behavioral Response: CasualAlertEuthymic  Type of Therapy: Individual Therapy  Treatment Goals addressed: Anxiety  Interventions: Motivational Interviewing, Strength-based and Supportive  Summary: Autumn Garcia is a 53 y.o. female who presents with ***.   Suicidal/Homicidal: No  Therapist Response: ***  Plan: Return again in 2 weeks.  Diagnosis: Axis I: Disociative Identity disorder    Axis II: deferred    Jacquelyn Antony,[name], LCSW 01/22/2013

## 2013-01-27 ENCOUNTER — Encounter (INDEPENDENT_AMBULATORY_CARE_PROVIDER_SITE_OTHER): Payer: Self-pay | Admitting: Surgery

## 2013-01-27 ENCOUNTER — Ambulatory Visit (INDEPENDENT_AMBULATORY_CARE_PROVIDER_SITE_OTHER): Payer: Medicare Other | Admitting: Surgery

## 2013-01-27 VITALS — BP 127/80 | HR 66 | Temp 98.6°F | Resp 14 | Ht 64.0 in | Wt 150.0 lb

## 2013-01-27 DIAGNOSIS — K439 Ventral hernia without obstruction or gangrene: Secondary | ICD-10-CM | POA: Insufficient documentation

## 2013-01-27 NOTE — Progress Notes (Signed)
Patient ID: Autumn Garcia, female   DOB: 1961-08-13, 52 y.o.   MRN: [id number]  Chief Complaint  Patient presents with  . New Evaluation    eval hernia    HPI Autumn Garcia is a 52 y.o. female.  Referred by Dr. Nani Gasser for evaluation of ventral hernia HPI This is a 52 year old female with a complicated past surgical and psychiatric history who presents with pain in her right side. She underwent a laparoscopic right nephrectomy in 2010 at [facility].  At her last postoperative visit there, she  Mentioned to them that she was having pain at her extraction site in her right lower quadrant just medial to the anterior iliac spine. They told her that she did not have hernia. This pain has worsened over the last couple of years. He feels much better when she is supine. Coughing, sneezing, any type of straining causes pain in this area. She had a CT scan last year which was performed in the supine position. This did not show an obvious hernia.  Past Medical History  Diagnosis Date  . Anxiety   . Depression   . PTSD (post-traumatic stress disorder)   . Chronic kidney disease   . Skin cancer of forehead     If could be a more serious cancer and will not know until after surgery  . HSV infection   . TB (tuberculosis)     Tested positive and treated  . Epilepsy     Past Surgical History  Procedure Laterality Date  . Cholecystectomy    . Kidney surgery    . Removal of basil cell carcinoma      Family History  Problem Relation Age of Onset  . Bipolar disorder Mother   . Heart attack Mother   . Bipolar disorder Sister   . Cancer Sister     skin  . Dementia Brother   . Hypertension Brother   . Cancer Sister     breast,brain,cervical ovarian  . Cancer Maternal Aunt     breast  . Cancer Cousin     breast    Social History History  Substance Use Topics  . Smoking status: Never Smoker   . Smokeless tobacco: Never Used  . Alcohol Use: Yes     Comment:  Occasional wine    Allergies  Allergen Reactions  . Demerol (Meperidine) Nausea And Vomiting    seizures  . Latex Rash  . Nickel     infections  . Ciprofloxacin     REACTION: sucidal  . Codeine     REACTION: Halluncination  . Erythromycin     REACTION: Vomiting  . Meperidine Hcl     REACTION: Muscle weakness, vomiting    Current Outpatient Prescriptions  Medication Sig Dispense Refill  . cholecalciferol (VITAMIN D) 1000 UNITS tablet Take 2,000 Units by mouth daily.      . Diazepam (VALIUM PO) Take 1 mg by mouth 2 (two) times daily.      Marland Kitchen ibuprofen (ADVIL,MOTRIN) 600 MG tablet Take 1 tablet (600 mg total) by mouth every 6 (six) hours as needed for pain.  30 tablet  0  . magnesium 30 MG tablet Take 30 mg by mouth daily.      . Multiple Vitamins-Minerals (MULTIVITAMIN WITH MINERALS) tablet Take 1 tablet by mouth daily.      . valACYclovir (VALTREX) 500 MG tablet        No current facility-administered medications for this visit.    Review of Systems  Review of Systems  Constitutional: Negative for fever, chills and unexpected weight change.  HENT: Negative for hearing loss, congestion, sore throat, trouble swallowing and voice change.   Eyes: Negative for visual disturbance.  Respiratory: Negative for cough and wheezing.   Cardiovascular: Negative for chest pain, palpitations and leg swelling.  Gastrointestinal: Positive for abdominal pain. Negative for nausea, vomiting, diarrhea, constipation, blood in stool, abdominal distention and anal bleeding.  Genitourinary: Negative for hematuria, vaginal bleeding and difficulty urinating.  Musculoskeletal: Negative for arthralgias.  Skin: Negative for rash and wound.  Neurological: Positive for dizziness, seizures, weakness and headaches. Negative for syncope.  Hematological: Negative for adenopathy. Does not bruise/bleed easily.  Psychiatric/Behavioral: Negative for confusion.    Blood pressure 127/80, pulse 66, temperature 98.6  F (37 C), temperature source Temporal, resp. rate 14, height 5\' 4"  (1.626 m), weight 150 lb (68.04 kg).  Physical Exam Physical Exam WDWN in NAD HEENT:  EOMI, sclera anicteric Neck:  No masses, no thyromegaly Lungs:  CTA bilaterally; normal respiratory effort CV:  Regular rate and rhythm; no murmurs Abd:  +bowel sounds, soft, healed laparoscopic nephrectomy incisions In the right lower quadrant, she has a 3-1/2-4 cm transverse incision just medial to her right anterior iliac spine.  She is tender directly over this area. It is her only area of abdominal tenderness. When she is standing and with Valsalva maneuver a bulge is palpated in this area. I cannot palpate any other bulges in her abdomen with Valsalva maneuver. I cannot palpate a fascial defect. Ext:  Well-perfused; no edema Skin:  Warm, dry; no sign of jaundice  Data Reviewed Op Note - Flora Lipps - 04/28/2011 11:25 AM EDT OPERATIVE NOTE BETSI, CRESPI [facility]# [id number] Leandra Kern, MD Surgery Date: 06/23/2009 Location: [id number]-AE76  Date of Birth: 09/02/1961  DATE OF PROCEDURE: 06/23/2009  ATTENDING: Scarlette Shorts K. Hemal, M.D.  ASSISTANTLarey Seat. Allena Katz, M.D.  PREOPERATIVE DIAGNOSIS: Right staghorn calculus with decreased renal functioning.  POSTOPERATIVE DIAGNOSIS: Right staghorn calculus with decreased renal functioning.  PROCEDURE: Robotic-assisted laparoscopic simple nephrectomy, right.  INDICATIONS: This is a 52 year old female found to have a large right-sided staghorn calculus. For drainage, she has been managed with two percutaneous nephrostomy tubes. She showed decreased renal function on a renogram and after discussion of the risks and benefits, she elected to proceed with right-sided nephrectomy. Please note, she was counseled towards percutaneous nephrolithotomy but instead desired nephrectomy.  DESCRIPTION OF PROCEDURE: The patient was brought back to the operating room. A general  endotracheal anesthetic was induced. She was prepped and draped in the usual fashion. She was placed in the left down lateral decubitus position. The camera port and two flanking robotic ports were placed such that they triangulate the kidney and a further 12-mm assistant port was placed below the camera port. The robot was then docked.  The white line of Toldt on the right side was mobilized with the colon reflected medially. The duodenum was kocherized medially as well. This gave excellent exposure to the renal hilum and the ureter. The ureter was identified and dissected just distal to the lower pole of the kidney. The renal hilum was dissected and the renal artery and vein were taken in that order with Hem-o-lock clips. The pedicle was divided. The kidney was mobilized entirely. There were adhesions laterally from percutaneous nephrostomy tubes. At this point, the ureter was clipped and divided as well and the kidney, which was free, was placed in an EndoCatch bag. A small incision was  made just medial to the anterior superior iliac spine for removal of the specimen. The fascia of this incision was closed with #1 PDS and the skin was closed with subcutaneous 4-0 Monocryl. All port sites were closed. The 12-mm assistant and the camera port had fascia reapproximated and all skin sites were closed with 4-0 Monocryl. At this point, the procedure was ended. Please note that Dr. Luane School was present throughout the entirety of the case.  ESTIMATED BLOOD LOSS: 50 mL.  URINE OUTPUT: Unrecorded.  DRAINS: Foley catheter.  SPECIMENS: Right kidney.  COMPLICATIONS: None apparent.  DISPOSITION: The patient will go to PACU for further care.  Dictated by: Micah Flesher, MD  Transcript of Dictation Electronically Approved by Micah Flesher - 06/30/2009 14:46:32  Leandra Kern Attending Physician Urology  Electronically Authenticated by Rosanne Ashing Hemal - 07/15/2009 18:14:30  BNP/dk D  06/28/2009 T 06/28/2009 [id number] BMW#:[id number] Job#: [id number] Conf#:[id number]  Electronic CC's: ---------------- Leandra Kern, MD - [facility] Urology; UROLOGY   CT scan 08/28/12 *RADIOLOGY REPORT*  Clinical Data: Query incisional hernia and right nephrectomy site.  CT ABDOMEN AND PELVIS WITHOUT CONTRAST  Technique: Multidetector CT imaging of the abdomen and pelvis was  performed following the standard protocol without intravenous  contrast.  Comparison: 03/25/2009  Findings: Interval right nephrectomy noted without visible hernia.  An oval shaped soft tissue density along the anterior renal fascial  margin measures 0.9 cm in short axis and probably represents a  lymph node.  Several punctate the left kidney upper pole nonobstructive calculi  are present along with small left peripelvic cysts. No left  hydronephrosis, hydroureter, or ureteral calculus observed.  Compensatory hypertrophy of the left kidney noted.  Noncontrast CT appearance of the visualized portion of the liver,  spleen, pancreas, and adrenal glands normal. Borderline duodenal  wall thickening noted in the transverse duodenum as on image 33 of  series 2. No dilated bowel noted. Appendix normal. Gallbladder  absent.  The uterus and adnexa appear unremarkable.  Urinary bladder unremarkable.  Synovial herniation pit noted anteriorly along the junction of the  left femoral head and neck.  IMPRESSION:  1. Punctate nonobstructive left renal calculi.  2. No herniation at the site of the prior right nephrectomy.  Borderline prominent lymph node noted along the right anterior  pararenal fascia.  3. Mild wall thickening in the descending transverse duodenum -  duodenitis not excluded.  Original Report Authenticated By: Gaylyn Rong, M.D.   Assessment    Probable small ventral incisional hernia in the right lower quadrant at the site of her right nephrectomy     Plan    Laparoscopic ventral hernia repair with  mesh. The surgical procedure has been discussed with the patient.  Potential risks, benefits, alternative treatments, and expected outcomes have been explained.  All of the patient's questions at this time have been answered.  The likelihood of reaching the patient's treatment goal is good.  The patient understand the proposed surgical procedure and wishes to proceed.  She will need to stay in the hospital 1-2 days.        Wadell Craddock K. 01/27/2013, 2:22 PM

## 2013-01-30 ENCOUNTER — Ambulatory Visit (HOSPITAL_COMMUNITY): Payer: Self-pay | Admitting: Licensed Clinical Social Worker

## 2013-02-03 DIAGNOSIS — N950 Postmenopausal bleeding: Secondary | ICD-10-CM | POA: Diagnosis not present

## 2013-02-12 ENCOUNTER — Ambulatory Visit (HOSPITAL_COMMUNITY): Payer: Self-pay | Admitting: Licensed Clinical Social Worker

## 2013-02-13 ENCOUNTER — Other Ambulatory Visit (HOSPITAL_COMMUNITY): Payer: Self-pay | Admitting: Obstetrics and Gynecology

## 2013-02-13 ENCOUNTER — Other Ambulatory Visit (HOSPITAL_COMMUNITY): Payer: Self-pay | Admitting: *Deleted

## 2013-02-13 DIAGNOSIS — N950 Postmenopausal bleeding: Secondary | ICD-10-CM

## 2013-02-25 DIAGNOSIS — R109 Unspecified abdominal pain: Secondary | ICD-10-CM | POA: Diagnosis not present

## 2013-02-26 ENCOUNTER — Ambulatory Visit (INDEPENDENT_AMBULATORY_CARE_PROVIDER_SITE_OTHER): Payer: Medicare Other | Admitting: Licensed Clinical Social Worker

## 2013-02-26 ENCOUNTER — Telehealth (HOSPITAL_COMMUNITY): Payer: Self-pay | Admitting: Psychiatry

## 2013-02-26 DIAGNOSIS — F4481 Dissociative identity disorder: Secondary | ICD-10-CM | POA: Diagnosis not present

## 2013-02-26 NOTE — Telephone Encounter (Signed)
Erroneous encounter

## 2013-02-27 ENCOUNTER — Other Ambulatory Visit (HOSPITAL_COMMUNITY): Payer: Self-pay

## 2013-02-28 ENCOUNTER — Other Ambulatory Visit (HOSPITAL_COMMUNITY): Payer: Self-pay | Admitting: *Deleted

## 2013-02-28 ENCOUNTER — Ambulatory Visit (HOSPITAL_COMMUNITY)
Admission: RE | Admit: 2013-02-28 | Discharge: 2013-02-28 | Disposition: A | Discharge status: Home / Self Care | Payer: Medicare Other | Source: Ambulatory Visit | Attending: Family Medicine | Admitting: Family Medicine

## 2013-02-28 DIAGNOSIS — N950 Postmenopausal bleeding: Secondary | ICD-10-CM | POA: Diagnosis not present

## 2013-02-28 IMAGING — US US SONOHYSTEROGRAM - WITH RAD
3 series · 13 of 25 positions shown · non-contrast
Comparison: [DATE]

CLINICAL DATA: Postmenopausal bleeding

US SONOHYSTEROGRAM
TECHNIQUE: Following cleansing of the cervix and vagina with
Betadine, a hysterosalpingogram catheter was placed within the
endocervical canal.  Sonohysterogram was then performed with
transvaginal sonography during infusion of sterile saline solution
into the endometrial cavity.

[Series 1: us sonohysterogram · 6 of 25 slices shown (1 of 3)]
[im 1/25]
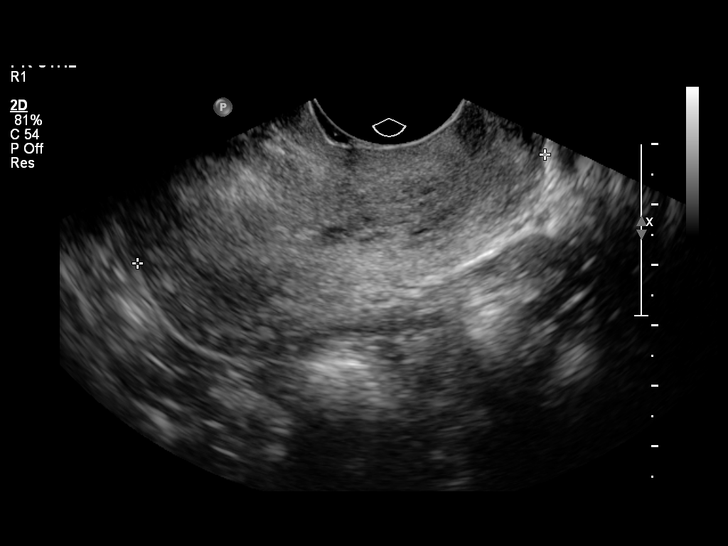
[im 5/25]
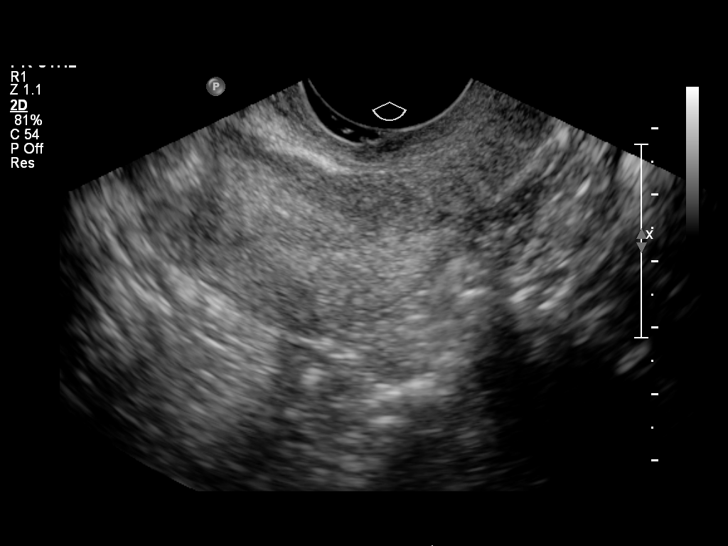
[im 10/25]
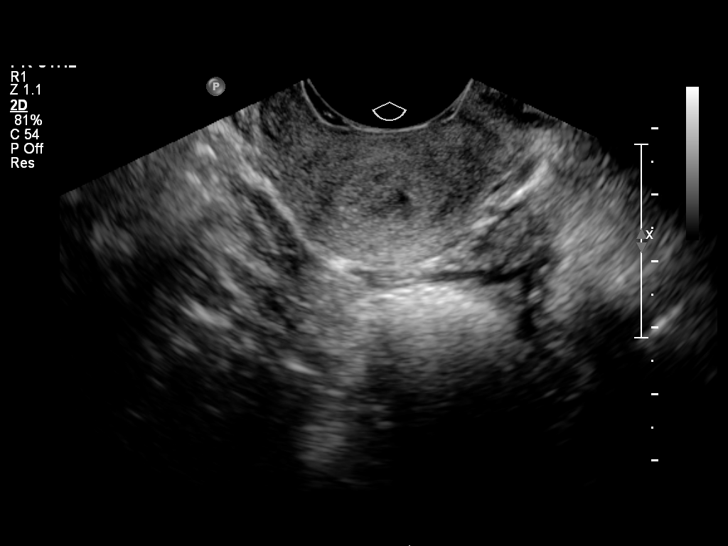
[im 15/25]
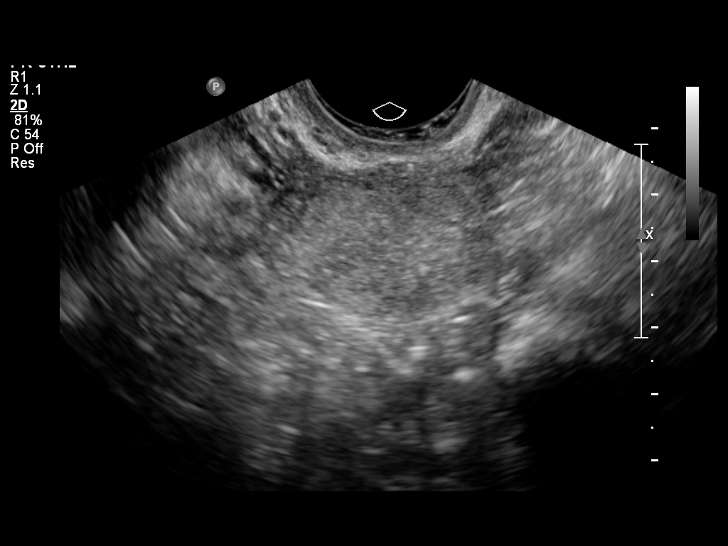
[im 20/25]
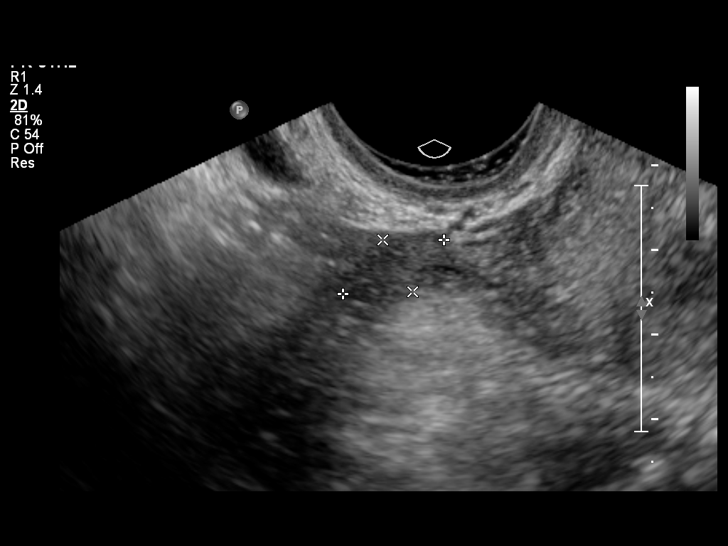
[im 25/25]
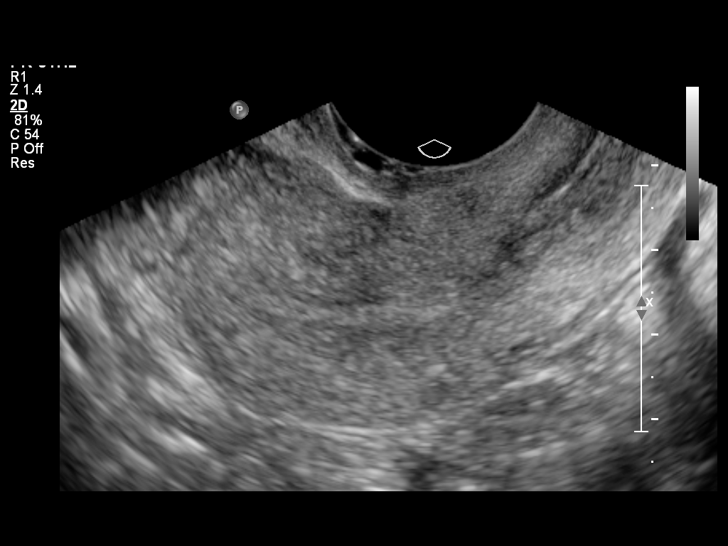

[Series 1: us sonohysterogram · 27 acquisitions, 5 frames shown (2 of 3)]
[im 3/27]
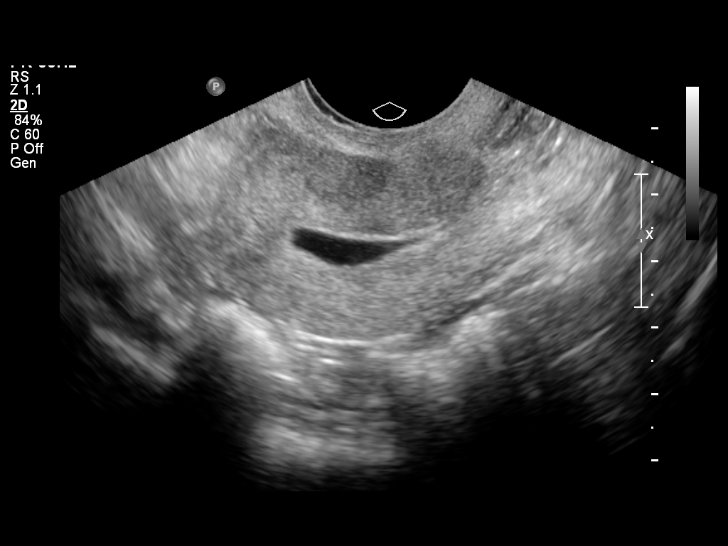
[im 8/27]
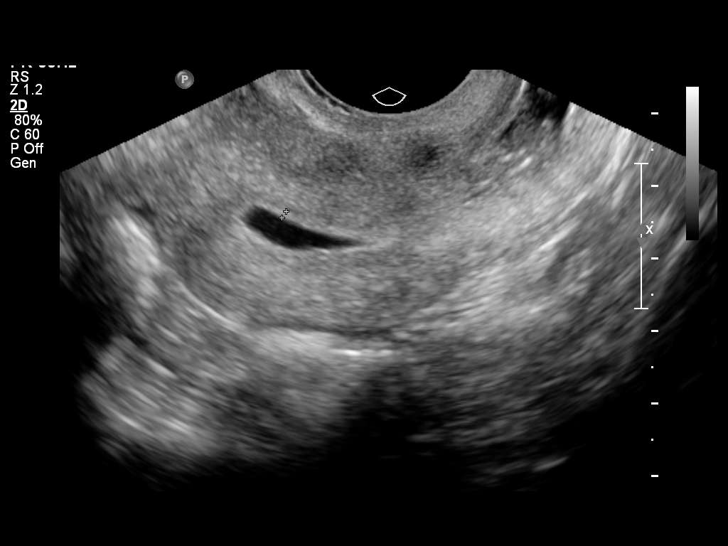
[im 14/27]
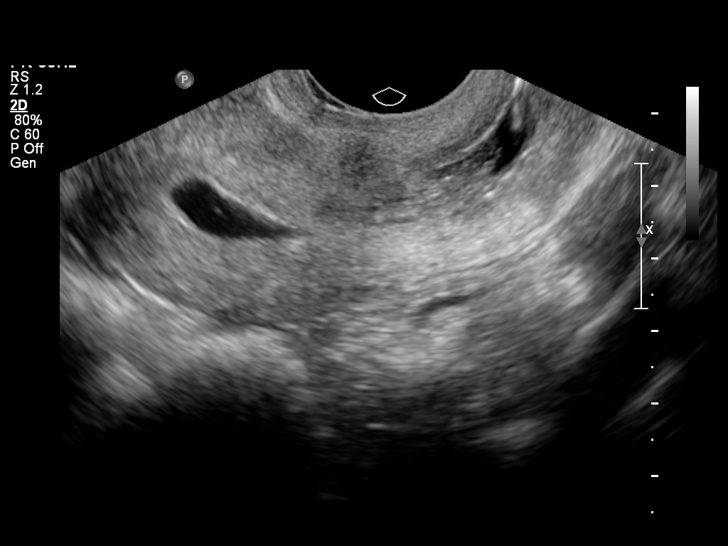
[im 19/27]
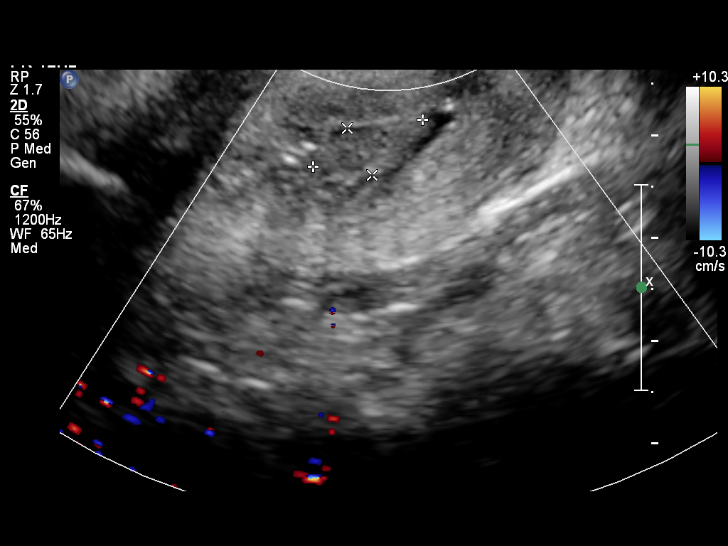
[im 24/27]
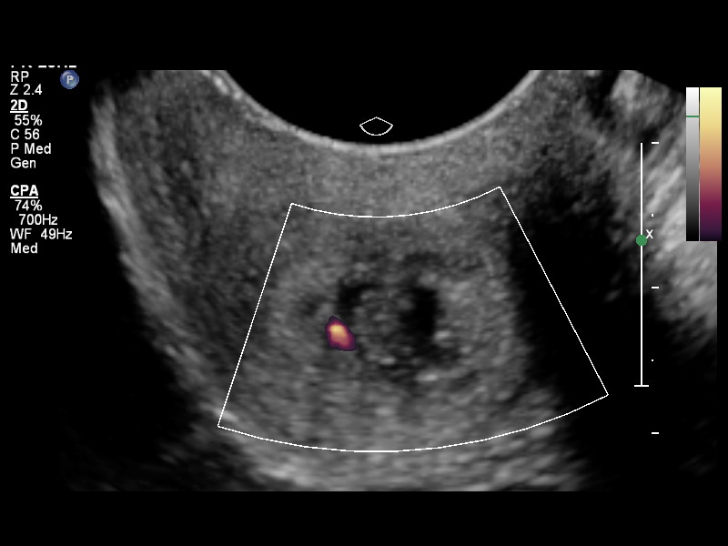

[Series 1: us sonohysterogram · 2 of 7 slices shown (3 of 3)]
[im 1/7]
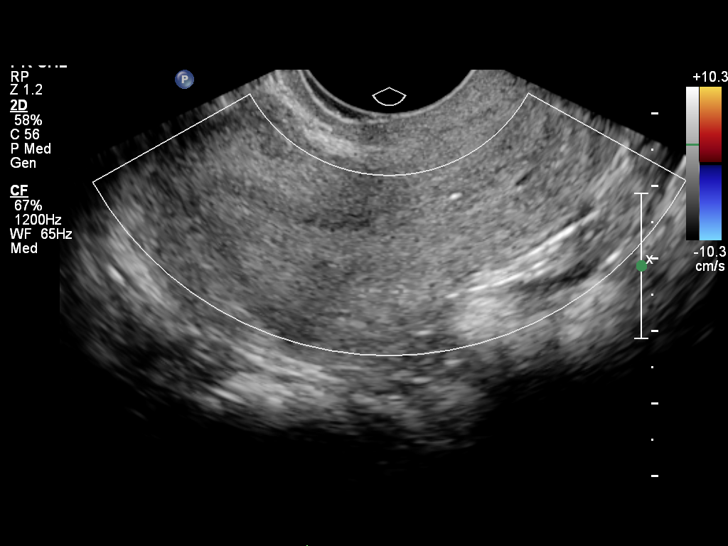
[im 7/7]
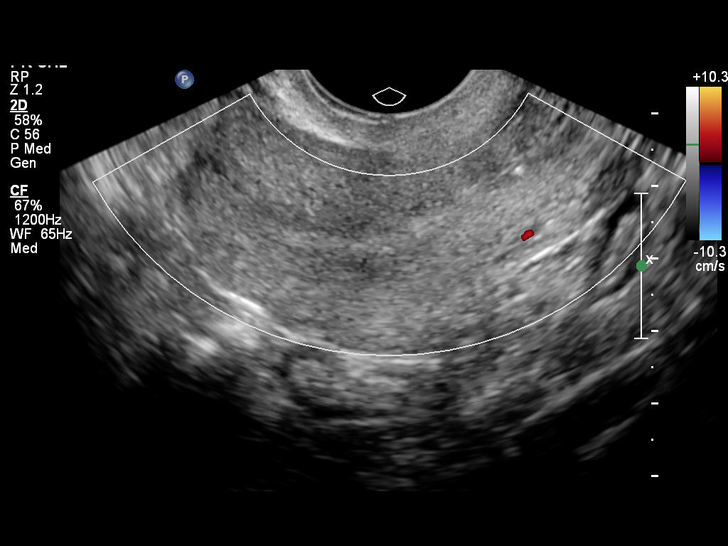

[13 of 25 positions shown; findings below may reference images not displayed]

FINDINGS: A thin endometrial lining is identified with a single
layer thickness of 1.4 mm.  No areas of focal thickening or
heterogeneity are noted.

Following evaluation of the endometrial lining, the catheter was
removed from the cervix and the endocervical canal evaluated.  A
hypoechoic soft tissue density is identified within the
endocervical canal measuring 1.2 x 0.4 x 0.5 cm. While no feeding
vessel could be demonstrated and the area is not echogenic as is
typical for a polyp, a cervical polyp is not excluded with this
appearance.  This could also represent a retained clot or redundant
folds of endocervical mucosa
IMPRESSION: Thin endometrial lining with no focal endometrial abnormality
identified.  The appearance is normal in a postmenopausal patient.

Hypoechoic soft tissue density within the endocervical canal with
no sonographic features definitive for a polyp.  A polyp without
demonstrated vascular stalk, retained clot or redundant mucosal
folds could give this appearance.

## 2013-03-11 ENCOUNTER — Encounter (HOSPITAL_COMMUNITY): Payer: Self-pay | Admitting: Psychiatry

## 2013-03-11 ENCOUNTER — Ambulatory Visit (INDEPENDENT_AMBULATORY_CARE_PROVIDER_SITE_OTHER): Payer: Medicare Other | Admitting: Psychiatry

## 2013-03-11 VITALS — BP 118/78 | Ht 64.0 in | Wt 154.0 lb

## 2013-03-11 DIAGNOSIS — R569 Unspecified convulsions: Secondary | ICD-10-CM | POA: Diagnosis not present

## 2013-03-11 DIAGNOSIS — F4310 Post-traumatic stress disorder, unspecified: Secondary | ICD-10-CM | POA: Diagnosis not present

## 2013-03-11 DIAGNOSIS — F445 Conversion disorder with seizures or convulsions: Secondary | ICD-10-CM

## 2013-03-11 DIAGNOSIS — F4481 Dissociative identity disorder: Secondary | ICD-10-CM | POA: Diagnosis not present
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
## 2013-03-12 ENCOUNTER — Emergency Department (INDEPENDENT_AMBULATORY_CARE_PROVIDER_SITE_OTHER)
Admission: EM | Admit: 2013-03-12 | Discharge: 2013-03-12 | Disposition: A | Discharge status: Home / Self Care | Payer: Medicare Other | Source: Home / Self Care | Attending: Family Medicine | Admitting: Family Medicine

## 2013-03-12 ENCOUNTER — Encounter: Payer: Self-pay | Admitting: Emergency Medicine

## 2013-03-12 DIAGNOSIS — S60562A Insect bite (nonvenomous) of left hand, initial encounter: Secondary | ICD-10-CM

## 2013-03-12 DIAGNOSIS — W57XXXA Bitten or stung by nonvenomous insect and other nonvenomous arthropods, initial encounter: Secondary | ICD-10-CM

## 2013-03-12 DIAGNOSIS — S60569A Insect bite (nonvenomous) of unspecified hand, initial encounter: Secondary | ICD-10-CM | POA: Diagnosis not present

## 2013-03-12 MED ORDER — DOXYCYCLINE HYCLATE 100 MG PO CAPS: 100.0000 mg | ORAL_CAPSULE | Freq: Two times a day (BID) | ORAL | Status: DC

## 2013-03-12 NOTE — ED Provider Notes (Signed)
History     CSN: [id number]  Arrival date & time 03/12/13  1945   First MD Initiated Contact with Patient 03/12/13 2006      Chief Complaint  Patient presents with  . Insect Bite       HPI Comments: Patient had been working outside today, wearing gloves, when she felt several stings on the dorsum of her left hand.  She has persistent swelling and pain in the dorsum of her left hand.  No rash.  No difficulty swallowing or shortness of breath.  Her last Tdap was 3 years ago.  Patient is a 52 y.o. female presenting with trauma. The history is provided by the patient.  Trauma Mechanism of injury: insect sting Injury location: hand Injury location detail: dorsum of L hand Incident location: working outside wearing gloves. Time since incident: 4 hours   Current symptoms:      Pain quality: burning      Pain timing: constant      Associated symptoms:            Mild dizziness  Relevant PMH:      Medical risk factors:            Kidney disease.       Tetanus status: UTD   Past Medical History  Diagnosis Date  . Anxiety   . Depression   . PTSD (post-traumatic stress disorder)   . Chronic kidney disease   . Skin cancer of forehead     If could be a more serious cancer and will not know until after surgery  . HSV infection   . TB (tuberculosis)     Tested positive and treated  . Epilepsy     Past Surgical History  Procedure Laterality Date  . Cholecystectomy    . Kidney surgery    . Removal of basil cell carcinoma      Family History  Problem Relation Age of Onset  . Bipolar disorder Mother   . Heart attack Mother   . Bipolar disorder Sister   . Cancer Sister     skin  . Dementia Brother   . Hypertension Brother   . Cancer Sister     breast,brain,cervical ovarian  . Cancer Maternal Aunt     breast  . Cancer Cousin     breast    History  Substance Use Topics  . Smoking status: Never Smoker   . Smokeless tobacco: Never Used  . Alcohol Use: Yes   Comment: Occasional wine    OB History   Grav Para Term Preterm Abortions TAB SAB Ect Mult Living   1 0     0   0      Review of Systems  All other systems reviewed and are negative.    Allergies  Demerol; Latex; Nickel; Ciprofloxacin; Codeine; Erythromycin; and Meperidine hcl  Home Medications   Current Outpatient Rx  Name  Route  Sig  Dispense  Refill  . cholecalciferol (VITAMIN D) 1000 UNITS tablet   Oral   Take 2,000 Units by mouth daily.         . diazepam (VALIUM) 2 MG tablet      2 mg every 8 (eight) hours as needed for anxiety.          Marland Kitchen doxycycline (VIBRAMYCIN) 100 MG capsule   Oral   Take 1 capsule (100 mg total) by mouth 2 (two) times daily.   14 capsule   0   . magnesium 30  MG tablet   Oral   Take 30 mg by mouth daily.         . Multiple Vitamins-Minerals (MULTIVITAMIN WITH MINERALS) tablet   Oral   Take 1 tablet by mouth daily.         . valACYclovir (VALTREX) 500 MG tablet               . valACYclovir (VALTREX) 500 MG tablet      Take 1 tablet PO BID x 3 days for flare, then 1 tablet daily for suppression           BP 113/76  Pulse 73  Temp(Src) 98.3 F (36.8 C) (Oral)  Resp 16  Ht 5\' 4"  (1.626 m)  Wt 150 lb (68.04 kg)  BMI 25.73 kg/m2  SpO2 100%  Physical Exam  Vitals reviewed. Constitutional: She is oriented to person, place, and time. She appears well-developed and well-nourished. No distress.  HENT:  Head: Atraumatic.  Eyes: Conjunctivae are normal. Pupils are equal, round, and reactive to light.  Neck: Neck supple.  Cardiovascular: Normal heart sounds.   Pulmonary/Chest: Breath sounds normal.  Musculoskeletal:       Arms: Dorsum of left hand is mildly swollen and warm.  No erythema; minimal tenderness.  Distal neurovascular function is intact.  All fingers have full range of motion.  Neurological: She is alert and oriented to person, place, and time.  Skin: Skin is warm and dry.    ED Course  Procedures   none      1. Insect bite hand, left, initial encounter       MDM  Begin doxycycline for staph coverage. Apply ice pack for 15 minutes 3 or 4 times daily until swelling decreases.  May take Ibuprofen 200mg , 4 tabs every 8 hours with food.  Elevate arm.  May take antihistamine such as Allegra. Return if not improving about 3 days.        Lattie Haw, MD 03/14/13 [phone]

## 2013-03-12 NOTE — ED Notes (Signed)
Worked in yard all day and 4 hours ago was bitten under glove on left hand x 4 by unknown insect. No OTCs.

## 2013-03-13 ENCOUNTER — Ambulatory Visit (HOSPITAL_COMMUNITY): Payer: Self-pay | Admitting: Licensed Clinical Social Worker

## 2013-03-24 ENCOUNTER — Encounter (HOSPITAL_COMMUNITY): Admission: RE | Payer: Self-pay | Source: Ambulatory Visit

## 2013-03-24 ENCOUNTER — Ambulatory Visit (HOSPITAL_COMMUNITY): Admission: RE | Admit: 2013-03-24 | Payer: Medicare Other | Source: Ambulatory Visit | Admitting: Surgery

## 2013-03-24 SURGERY — REPAIR, HERNIA, VENTRAL, LAPAROSCOPIC
Anesthesia: General

## 2013-03-27 ENCOUNTER — Ambulatory Visit (HOSPITAL_COMMUNITY): Payer: Self-pay | Admitting: Licensed Clinical Social Worker
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
## 2013-04-07 DIAGNOSIS — L255 Unspecified contact dermatitis due to plants, except food: Secondary | ICD-10-CM | POA: Diagnosis not present

## 2013-04-09 DIAGNOSIS — T6391XA Toxic effect of contact with unspecified venomous animal, accidental (unintentional), initial encounter: Secondary | ICD-10-CM | POA: Diagnosis not present

## 2013-04-09 DIAGNOSIS — T63461A Toxic effect of venom of wasps, accidental (unintentional), initial encounter: Secondary | ICD-10-CM | POA: Diagnosis not present

## 2013-04-10 ENCOUNTER — Ambulatory Visit (HOSPITAL_COMMUNITY): Payer: Self-pay | Admitting: Licensed Clinical Social Worker

## 2013-04-22 ENCOUNTER — Ambulatory Visit (INDEPENDENT_AMBULATORY_CARE_PROVIDER_SITE_OTHER): Payer: Medicare Other | Admitting: Licensed Clinical Social Worker

## 2013-04-22 DIAGNOSIS — F4481 Dissociative identity disorder: Secondary | ICD-10-CM
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
## 2013-05-06 ENCOUNTER — Ambulatory Visit (INDEPENDENT_AMBULATORY_CARE_PROVIDER_SITE_OTHER): Payer: Medicare Other | Admitting: Licensed Clinical Social Worker

## 2013-05-06 DIAGNOSIS — F4481 Dissociative identity disorder: Secondary | ICD-10-CM

## 2013-05-07 NOTE — Progress Notes (Signed)
   THERAPIST PROGRESS NOTE  Session Time: 4:10 - 5:00  Participation Level: Active  Behavioral Response: CasualAlertEuthymic  Type of Therapy: Individual Therapy  Treatment Goals addressed: Anxiety  Interventions: Motivational Interviewing and Supportive  Summary: Autumn Garcia is a 52 y.o. female who presents with good spirits.  [name] mainly talked about her sister's break down.  It preoccupies her thinking - she is not sure what to think about the whole situation of her being such a different person for 7 years.  Her sister expressed feelings about her leaving her alone with their mother - things got worse for her then.  [name] had no idea for mother did not seem to go after her very much.  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  Her seizures seem to be increasing - not sure why for she had been doing better.  Her memories are not just memories - it is like she is there and reliving it in the present.  So it is hard to use the turn of the TV method of distancing from the situation because it is not like a movie.  She talks about the episodes like she has to compulsively go through the whole scenario.    Suicidal/Homicidal: No  Therapist Response: Discussing different methods to deal with dissociation, exploration of reasons anxiety is higher  Plan: Return again in 2 weeks.  Diagnosis: Axis I: Dissociative Identity Disorder    Axis II: Deferred    Reason Helzer,[name], LCSW 05/07/2013

## 2013-05-13 ENCOUNTER — Encounter (HOSPITAL_COMMUNITY): Payer: Self-pay | Admitting: Psychiatry

## 2013-05-13 ENCOUNTER — Ambulatory Visit (INDEPENDENT_AMBULATORY_CARE_PROVIDER_SITE_OTHER): Payer: Medicare Other | Admitting: Psychiatry

## 2013-05-13 VITALS — BP 112/72 | Ht 64.0 in | Wt 152.0 lb

## 2013-05-13 DIAGNOSIS — R569 Unspecified convulsions: Secondary | ICD-10-CM

## 2013-05-13 DIAGNOSIS — F4481 Dissociative identity disorder: Secondary | ICD-10-CM

## 2013-05-13 DIAGNOSIS — F4310 Post-traumatic stress disorder, unspecified: Secondary | ICD-10-CM | POA: Diagnosis not present

## 2013-05-13 DIAGNOSIS — F449 Dissociative and conversion disorder, unspecified: Secondary | ICD-10-CM

## 2013-05-13 MED ORDER — DIAZEPAM 2 MG PO TABS: 2.0000 mg | ORAL_TABLET | Freq: Three times a day (TID) | ORAL | Status: DC | PRN

## 2013-05-13 NOTE — Progress Notes (Signed)
[facility] Initial Outpatient Visit  Daveigh Patrici Ranks 1961-03-06   Subjective: The patient is a 52 year old female who has been followed by [facility] since December of 2011. She has been seen by Merlene Morse this whole time. I have been treating her since June of 2013. At that time she was diagnosed with PTSD along with disassociative identity disorder and pseudoseizures. At her last appointment, I DID not make any changes. She presents today with her husband. The patient is concerned about her sister. Her sister is now out of the psychiatric hospital, but still cannot remember the last several years of her life. The patient saw a change in her sister over the last several years, and she feels that her sister's back to herself now. The patient sister continues to repress memories. The patient rather talk about her sister today that herself. 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 She finds it soothing and finds that when she does at work it is cathartic. Patient feels overall she is doing well. She finds value actually does help when she takes it prior to stressful situations. Yesterday she went to [employer], and did not take it. She did feel large difference. Filed Vitals:   05/13/13 1425  BP: 112/72   Active Ambulatory Problems    Diagnosis Date Noted  . CANDIDIASIS, ORAL 10/15/2009  . ANXIETY DISORDER, GENERALIZED 06/14/2009  . ACUTE PHARYNGITIS 05/15/2010  . URI 10/15/2009  . ACUTE CYSTITIS 01/26/2010  . HOT FLASHES 09/03/2009  . CELLULITIS, RIGHT LEG 03/18/2010  . KNEE PAIN 01/26/2009  . Other convulsions 01/26/2009  . SKIN RASH 01/26/2009  . Dissociative identity disorder 10/06/2011  . PTSD (post-traumatic stress disorder) 11/15/2011  . Skin cancer of forehead 11/15/2011  . Dissociative disorder 06/03/2012  . Ventral hernia without obstruction or gangrene 01/27/2013   Resolved Ambulatory Problems    Diagnosis Date Noted  . No  Resolved Ambulatory Problems   Past Medical History  Diagnosis Date  . Anxiety   . Depression   . Chronic kidney disease   . HSV infection   . TB (tuberculosis)   . Epilepsy    Current Outpatient Prescriptions on File Prior to Visit  Medication Sig Dispense Refill  . cholecalciferol (VITAMIN D) 1000 UNITS tablet Take 2,000 Units by mouth daily.      Marland Kitchen doxycycline (VIBRAMYCIN) 100 MG capsule Take 1 capsule (100 mg total) by mouth 2 (two) times daily.  14 capsule  0  . magnesium 30 MG tablet Take 30 mg by mouth daily.      . Multiple Vitamins-Minerals (MULTIVITAMIN WITH MINERALS) tablet Take 1 tablet by mouth daily.      . valACYclovir (VALTREX) 500 MG tablet       . valACYclovir (VALTREX) 500 MG tablet Take 1 tablet PO BID x 3 days for flare, then 1 tablet daily for suppression       No current facility-administered medications on file prior to visit.   Review of Systems - General ROS: negative for - malaise or weight loss Psychological ROS: positive for - anxiety and sleep disturbances Cardiovascular ROS: no chest pain or dyspnea on exertion Musculoskeletal ROS: negative for - gait disturbance or muscular weakness Neurological ROS: negative for - dizziness or headaches   Mental Status Examination  Appearance: Casual, holds teddy bear. Alert: Yes Attention: good  Cooperative: Yes Eye Contact: Good Speech: Hesitant at times, monotone Psychomotor Activity: Decreased Memory/Concentration: Intact Oriented: person, place, time/date and situation Mood:  Anxious Affect: Constricted Thought Processes and Associations: Goal Directed Fund of Knowledge: Fair Thought Content: No suicidal or homicidal thoughts Insight: Fair Judgement: Fair  Diagnosis: Posttraumatic stress disorder, disassociative identity disorder, pseudoseizures  Treatment Plan: I will continue Valium at 2 mg 3 times a day as needed. I will see the patient back in 1 month. Patient may call with concerns. Jamse Mead, MD

## 2013-05-21 ENCOUNTER — Ambulatory Visit (HOSPITAL_COMMUNITY): Payer: Medicare Other | Admitting: Licensed Clinical Social Worker

## 2013-06-04 ENCOUNTER — Ambulatory Visit (INDEPENDENT_AMBULATORY_CARE_PROVIDER_SITE_OTHER): Payer: Medicare Other | Admitting: Licensed Clinical Social Worker

## 2013-06-04 DIAGNOSIS — F4481 Dissociative identity disorder: Secondary | ICD-10-CM | POA: Diagnosis not present

## 2013-06-05 NOTE — Progress Notes (Signed)
   THERAPIST PROGRESS NOTE  Session Time: 4:00 - 5:00  Participation Level: Active  Behavioral Response: CasualAlertEuthymic  Type of Therapy: Individual Therapy  Treatment Goals addressed: Anxiety  Interventions: Motivational Interviewing and Supportive  Summary: Autumn Garcia is a 52 y.o. female who presents with good spirits.  Talked a lot about the fact that she seemed to be in and out of different identities this last week. [name] confirmed same.  She is not sure what was different but she was not remembering bits of time and the dog was acting differently towards her.  She does get tired of the noise in her head.  She feels like there are several people in there arguing.  It gets annoying and she really cannot make out what the arguments are about.  She did get some art done but not as much as she usually does.    Suicidal/Homicidal: No  Plan: Return again in 2 weeks.  Diagnosis: Axis I: Dissociative Identity disorder    Axis II: Deferred    Nathanial Arrighi,[name], LCSW 06/05/2013

## 2013-06-13 ENCOUNTER — Ambulatory Visit (HOSPITAL_COMMUNITY): Payer: Self-pay | Admitting: Psychiatry

## 2013-06-18 ENCOUNTER — Ambulatory Visit (INDEPENDENT_AMBULATORY_CARE_PROVIDER_SITE_OTHER): Payer: Medicare Other | Admitting: Licensed Clinical Social Worker

## 2013-06-18 DIAGNOSIS — F4481 Dissociative identity disorder: Secondary | ICD-10-CM | POA: Diagnosis not present

## 2013-06-19 NOTE — Progress Notes (Signed)
   THERAPIST PROGRESS NOTE  Session Time: 3:10 - 4:00  Participation Level: Active  Behavioral Response: CasualAlertEuthymic  Type of Therapy: Individual Therapy  Treatment Goals addressed: Anxiety and Coping  Interventions: Motivational Interviewing and Supportive  Summary: Autumn Garcia is a 52 y.o. female who presents with some difficulty talking about her trauma today.  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  Showed counselor pictures.  She is proud of their work so far and excited about when it gets completed.  She does well in finding bargains - she researches everything that she does.  Her husband is appreciative of her contributions.  She is a very Chief Executive Officer.  She also showed me the before and after pictures of their yard that she landscaped.  There was absolutely nothing there but dirt and a few bushes.  She did a beautiful job. - professional looking.  The only problem with her work is when she gets in her compulsive mood she cannot stop working in order to eat, drink water or go to bathroom. Her husband has to find ways to pull her away. It is like she is driven to finish and get to a goal in her head which might be unrealistic.  She had no seizures this week..   Suicidal/Homicidal: No  Therapist Response: Supportive of her abilities, did not encourage talking about her traumas,  Have attempted to help her use methods to block her traumas.  Actually she seems to have a need to talk about these traumas in session - for she has not been able to talk about them before now.  Except with her husband whom she trusts  Plan: Return again in 2 weeks.  Diagnosis: Axis I: Dissociative identity disorder    Axis II: Deferred    Benicio Manna,[name], LCSW 06/19/2013

## 2013-07-02 ENCOUNTER — Ambulatory Visit (HOSPITAL_COMMUNITY): Payer: Medicare Other | Admitting: Licensed Clinical Social Worker

## 2013-07-02 DIAGNOSIS — F4481 Dissociative identity disorder: Secondary | ICD-10-CM

## 2013-07-16 ENCOUNTER — Ambulatory Visit (HOSPITAL_COMMUNITY): Payer: Self-pay | Admitting: Licensed Clinical Social Worker

## 2013-07-24 ENCOUNTER — Ambulatory Visit (INDEPENDENT_AMBULATORY_CARE_PROVIDER_SITE_OTHER): Payer: Medicare Other | Admitting: Psychiatry

## 2013-07-24 ENCOUNTER — Encounter (HOSPITAL_COMMUNITY): Payer: Self-pay | Admitting: Psychiatry

## 2013-07-24 ENCOUNTER — Encounter (INDEPENDENT_AMBULATORY_CARE_PROVIDER_SITE_OTHER): Payer: Self-pay

## 2013-07-24 VITALS — BP 110/72 | Ht 64.0 in | Wt 154.0 lb

## 2013-07-24 DIAGNOSIS — F4310 Post-traumatic stress disorder, unspecified: Secondary | ICD-10-CM

## 2013-07-24 DIAGNOSIS — F4481 Dissociative identity disorder: Secondary | ICD-10-CM

## 2013-07-24 MED ORDER — DIAZEPAM 2 MG PO TABS: 2.0000 mg | ORAL_TABLET | Freq: Three times a day (TID) | ORAL | Status: DC | PRN

## 2013-07-24 NOTE — Progress Notes (Signed)
[facility] Initial Outpatient Visit  Autumn Garcia [birth year]   Subjective: The patient is a 52 year old female who has been followed by [facility] since December of 2011. She has been seen by Merlene Morse this whole time. I have been treating her since June of 2013. At that time she was diagnosed with PTSD along with disassociative identity disorder and pseudoseizures. At her last appointment, I DID not make any changes. She presents today with her husband. The patient is concerned that her husband thinks is addicted to Valium. She asked for 3 in one day. He questioned the third one. Since then she has not taken any in one week. The patient has had an increase in seizures since that time. The patient continues with nightmares every night. She is worried about her sister. Her sister has been diagnosed with disassociative amnesia. She does not remember anything of the last 7 years. The patient discusses the book "Like Water for Chocolate". She states is very much like her and her sisters upbringing by her mother. The patient finished the portrait. She is 60 days behind on her painting each day. At originally she felt a lot of pressure about it, but was able to let it go. Patient is up 2 pounds today. She does not want to know. Overall she feels she is doing okay. Filed Vitals:   07/24/13 1426  BP: 110/72   Active Ambulatory Problems    Diagnosis Date Noted  . CANDIDIASIS, ORAL 10/15/2009  . ANXIETY DISORDER, GENERALIZED 06/14/2009  . ACUTE PHARYNGITIS 05/15/2010  . URI 10/15/2009  . ACUTE CYSTITIS 01/26/2010  . HOT FLASHES 09/03/2009  . CELLULITIS, RIGHT LEG 03/18/2010  . KNEE PAIN 01/26/2009  . Other convulsions 01/26/2009  . SKIN RASH 01/26/2009  . Dissociative identity disorder 10/06/2011  . PTSD (post-traumatic stress disorder) 11/15/2011  . Skin cancer of forehead 11/15/2011  . Dissociative disorder 06/03/2012  . Ventral hernia without  obstruction or gangrene 01/27/2013   Resolved Ambulatory Problems    Diagnosis Date Noted  . No Resolved Ambulatory Problems   Past Medical History  Diagnosis Date  . Anxiety   . Depression   . Chronic kidney disease   . HSV infection   . TB (tuberculosis)   . Epilepsy    Current Outpatient Prescriptions on File Prior to Visit  Medication Sig Dispense Refill  . cholecalciferol (VITAMIN D) 1000 UNITS tablet Take 2,000 Units by mouth daily.      Marland Kitchen doxycycline (VIBRAMYCIN) 100 MG capsule Take 1 capsule (100 mg total) by mouth 2 (two) times daily.  14 capsule  0  . magnesium 30 MG tablet Take 30 mg by mouth daily.      . Multiple Vitamins-Minerals (MULTIVITAMIN WITH MINERALS) tablet Take 1 tablet by mouth daily.      . valACYclovir (VALTREX) 500 MG tablet       . valACYclovir (VALTREX) 500 MG tablet Take 1 tablet PO BID x 3 days for flare, then 1 tablet daily for suppression       No current facility-administered medications on file prior to visit.   Review of Systems - General ROS: negative for - malaise or weight loss Psychological ROS: positive for - anxiety and sleep disturbances Cardiovascular ROS: no chest pain or dyspnea on exertion Musculoskeletal ROS: negative for - gait disturbance or muscular weakness Neurological ROS: negative for - dizziness or headaches   Mental Status Examination  Appearance: Casual, holds teddy bear. Alert: Yes Attention: good  Cooperative: Yes Eye Contact: Good Speech: Hesitant at times, monotone Psychomotor Activity: Decreased Memory/Concentration: Intact Oriented: person, place, time/date and situation Mood: Anxious Affect: Constricted Thought Processes and Associations: Goal Directed Fund of Knowledge: Fair Thought Content: No suicidal or homicidal thoughts Insight: Fair Judgement: Fair  Diagnosis: Posttraumatic stress disorder, disassociative identity disorder, pseudoseizures  Treatment Plan: I will continue Valium at 2 mg 3 times  a day as needed. The patient will return in 1 month. Patient may call with concerns. Jamse Mead, MD

## 2013-07-30 ENCOUNTER — Ambulatory Visit (HOSPITAL_COMMUNITY): Payer: Medicare Other | Admitting: Licensed Clinical Social Worker

## 2013-07-30 DIAGNOSIS — F4481 Dissociative identity disorder: Secondary | ICD-10-CM

## 2013-08-12 ENCOUNTER — Ambulatory Visit (HOSPITAL_COMMUNITY): Payer: Self-pay | Admitting: Licensed Clinical Social Worker

## 2013-08-15 ENCOUNTER — Ambulatory Visit (INDEPENDENT_AMBULATORY_CARE_PROVIDER_SITE_OTHER): Payer: Medicare Other | Admitting: Licensed Clinical Social Worker

## 2013-08-15 DIAGNOSIS — F4481 Dissociative identity disorder: Secondary | ICD-10-CM | POA: Diagnosis not present

## 2013-08-22 DIAGNOSIS — R300 Dysuria: Secondary | ICD-10-CM | POA: Diagnosis not present

## 2013-08-22 DIAGNOSIS — F411 Generalized anxiety disorder: Secondary | ICD-10-CM | POA: Diagnosis not present

## 2013-08-22 DIAGNOSIS — G479 Sleep disorder, unspecified: Secondary | ICD-10-CM | POA: Diagnosis not present

## 2013-08-22 DIAGNOSIS — S92919A Unspecified fracture of unspecified toe(s), initial encounter for closed fracture: Secondary | ICD-10-CM | POA: Diagnosis not present

## 2013-08-22 DIAGNOSIS — N898 Other specified noninflammatory disorders of vagina: Secondary | ICD-10-CM | POA: Diagnosis not present

## 2013-08-25 ENCOUNTER — Ambulatory Visit (INDEPENDENT_AMBULATORY_CARE_PROVIDER_SITE_OTHER): Payer: Medicare Other | Admitting: Psychiatry

## 2013-08-25 ENCOUNTER — Encounter (HOSPITAL_COMMUNITY): Payer: Self-pay | Admitting: Psychiatry

## 2013-08-25 ENCOUNTER — Encounter (INDEPENDENT_AMBULATORY_CARE_PROVIDER_SITE_OTHER): Payer: Self-pay

## 2013-08-25 VITALS — BP 111/67 | HR 60 | Ht 64.0 in | Wt 157.0 lb

## 2013-08-25 DIAGNOSIS — F4481 Dissociative identity disorder: Secondary | ICD-10-CM | POA: Diagnosis not present

## 2013-08-25 DIAGNOSIS — R569 Unspecified convulsions: Secondary | ICD-10-CM | POA: Diagnosis not present

## 2013-08-25 DIAGNOSIS — F449 Dissociative and conversion disorder, unspecified: Secondary | ICD-10-CM

## 2013-08-25 DIAGNOSIS — F4310 Post-traumatic stress disorder, unspecified: Secondary | ICD-10-CM

## 2013-08-25 NOTE — Progress Notes (Signed)
[facility] [id number] Progress Note  Autumn Garcia [id number] 52 y.o.  08/25/2013 1:45 PM  Chief Complaint: Follow up  History of Present Illness: HPI Comments: [name] is  a 52 y/o female with a past psychiatric history significant for PTSD along with disassociative identity disorder and pseudoseizures. The patient is referred for psychiatric services for  medication management.    . Location: The patient reports that she is doing well overall . Quality: The patient reports that her main stressors are:  "death" -she fears death.  She come in today with a stuffed toy bear, her "seizure" therapy dog a her husband.  In the area of affective symptoms, patient appears anxious. Patient denies current suicidal ideation, intent, or plan. Patient denies current homicidal ideation, intent, or plan. Patient denies auditory hallucinations. Patient denies visual hallucinations. Patient denies symptoms of paranoia. Patient states sleep is fair, with approximately 4-8 hours of sleep per night. Appetite is varies depending on her mood. Energy level is poor. Patient endorses symptoms of anhedonia. Patient endorses/denies hopelessness, helplessness, or guilt.   . Severity: Depression: 5/10 (0=Very depressed; 5=Neutral; 10=Very Happy)  Anxiety- 10/10 (0=no anxiety; 5= moderate/tolerable anxiety; 10= panic attacks) . Duration: Childhood  . Timing: NO specific timing . Context: Worse with fatigue . Modifying factors: Improves wih being aroung her husband . Associated signs and symptoms (e.g., loss of appetite, loss of weight, loss of sexual interest)  Denies any recent episodes consistent with mania, particularly decreased need for sleep with increased energy, grandiosity, impulsivity, hyperverbal and pressured speech, or increased productivity. Denies any  recent symptoms consistent with psychosis, particularly auditory or visual hallucinations, thought  broadcasting/insertion/withdrawal, or ideas of reference. Also denies excessive worry to the point of physical symptoms as well as any panic attacks.  Suicidal Ideation: Negative Plan Formed: Negative Patient has means to carry out plan: Negative  Homicidal Ideation: Negative Plan Formed: Negative Patient has means to carry out plan: Negative  Review of Systems: Psychiatric: Agitation: No Hallucination: No Depressed Mood: Yes Insomnia: Yes Hypersomnia: Negative Altered Concentration: Negative Feels Worthless: Yes Grandiose Ideas: Negative Belief In Special Powers: Negative New/Increased Substance Abuse: Negative Compulsions: No  Neurologic: Headache: Negative Seizure: Yes Paresthesias: Negative  Past Medical Family, Social History:   Past Medical History  Diagnosis Date  . Anxiety   . Depression   . PTSD (post-traumatic stress disorder)   . Chronic kidney disease   . Skin cancer of forehead     If could be a more serious cancer and will not know until after surgery  . HSV infection   . TB (tuberculosis)     Tested positive and treated  . Epilepsy    Family History  Problem Relation Age of Onset  . Bipolar disorder Mother   . Heart attack Mother   . Bipolar disorder Sister   . Cancer Sister     skin  . Dementia Brother   . Hypertension Brother   . Cancer Sister     breast,brain,cervical ovarian  . Cancer Maternal Aunt     breast  . Cancer Cousin     breast   History   Social History Narrative  . No narrative on file      Outpatient Encounter Prescriptions as of 08/25/2013  Medication Sig  . cholecalciferol (VITAMIN D) 1000 UNITS tablet Take 2,000 Units by mouth daily.  . diazepam (VALIUM) 2 MG tablet Take 1 tablet (2 mg total) by mouth every 8 (eight) hours as needed for anxiety.  Marland Kitchen  doxycycline (VIBRAMYCIN) 100 MG capsule Take 1 capsule (100 mg total) by mouth 2 (two) times daily.  . magnesium 30 MG tablet Take 30 mg by mouth daily.  . Multiple  Vitamins-Minerals (MULTIVITAMIN WITH MINERALS) tablet Take 1 tablet by mouth daily.  . valACYclovir (VALTREX) 500 MG tablet   . valACYclovir (VALTREX) 500 MG tablet Take 1 tablet PO BID x 3 days for flare, then 1 tablet daily for suppression    Past Psychiatric History/Hospitalization(s): Anxiety: Negative Bipolar Disorder: Negative Depression: Negative Mania: Negative Psychosis: Negative Schizophrenia: Negative Personality Disorder: Negative Hospitalization for psychiatric illness: No History of Electroconvulsive Shock Therapy: No Prior Suicide Attempts: No  Physical Exam: Constitutional:  BP 111/67  Pulse 60  Ht 5\' 4"  (1.626 m)  Wt 157 lb (71.215 kg)  BMI 26.94 kg/m2  General Appearance: alert, oriented, no acute distress and well nourished  Musculoskeletal: Strength & Muscle Tone: within normal limits Gait & Station: unsteady, -due to vertigo Patient leans: N/A  Psychiatric: General Appearance: Casual and Fairly Groomed  Patent attorney::  Fair  Speech:  Clear and Coherent and Normal Rate  Volume:  Normal  Mood:  "overwhelmed" Depression: 5/10 (0=Very depressed; 5=Neutral; 10=Very Happy)  Anxiety- 10/10 (0=no anxiety; 5= moderate/tolerable anxiety; 10= panic attacks)   Affect:  Appropriate, Congruent and Full Range  Thought Process:  Circumstantial  Orientation:  Full (Time, Place, and Person)  Thought Content:  WDL  Suicidal Thoughts:  No  Homicidal Thoughts:  No  Memory:  Immediate;   Good Recent;   Poor Remote;   Poor  Judgement:  Good  Insight:  Good  Psychomotor Activity:  Normal  Concentration:  Fair  Akathisia:  No  Handed:  Ambidextrous  AIMS (if indicated):  Not indicated   Assets:  Communication Skills Desire for Improvement Financial Resources/Insurance Housing Intimacy Social Support Talents/Skills  Sleep:  Number of Hours: 4-8     Assessment: Axis I: PTSD along with disassociative identity disorder and pseudoseizures    Plan of  Care:  PLAN:  1. Affirm with the patient that the medications are taken as ordered. Patient  expressed understanding of how their medications were to be used.    Laboratory:  Not indicated at this time. Will order UDS at next visit.  Psychotherapy: Therapy: brief supportive therapy provided.  Discussed psychosocial stressors in detail.    Medications:  Continue the following psychiatric medications as written prior to this appointment with the following changes::  a) valium 2 mg TID B)Offered a trial of an AA but not interested in starting an atypical antipsychotic  -Risks and benefits, side effects and alternatives discussed with patient, he/she was given an opportunity to ask questions about his/her medication, illness, and treatment. All current psychiatric medications have been reviewed and discussed with the patient and adjusted as clinically appropriate. The patient has been provided an accurate and updated list of the medications being now prescribed.   Routine PRN Medications:  Negative  Consultations: The patient was encouraged to keep all PCP and specialty clinic appointments.   Safety Concerns:   Patient told to call clinic if any problems occur. Patient advised to go to  ER  if she should develop SI/HI, side effects, or if symptoms worsen. Has crisis numbers to call if needed.    Other:   8. Patient was instructed to return to clinic in 2 months.  9. The patient was advised to call and cancel their mental health appointment within 24 hours of the appointment, if they  are unable to keep the appointment, as well as the three no show and termination from clinic policy. 10. The patient expressed understanding of the plan and agrees with the above.    Jacqulyn Cane, MD 08/25/2013

## 2013-08-26 NOTE — Progress Notes (Signed)
   THERAPIST PROGRESS NOTE  Session Time: 11:00 - 12:00  Participation Level: Active  Behavioral Response: CasualAlertEuthymic  Type of Therapy: Individual Therapy  Treatment Goals addressed: Anxiety  Interventions: Motivational Interviewing and Supportive  Summary: [name] is a 52 y.o. female who presents with good spirits.  Autumn Garcia was looking forward with some anxiety to the contest that she was participating in that evening.  She is to paint a painting in twenty minutes and then have it judged by the attendees.  This is a very positive challenge for her.  To be around so many people and to be in an active competition not just showing her  Art on the computer.  She is challenging herself a lot more and handling situations that she could not handle when she started therapy.  She is having anxiety about changing doctors especially since he is a man and she is concerned about a new therapist - she may decide to take a break for awhile.  She has grown a lot and working on handling her seizures better.  Perhaps she and Dr. Hilton Cork could decide when she is ready again - by then there may be a new therapist in the clinic.  She and [name] are doing extremely well together - have discussed how their relationship is very enmeshed but that in so many ways the relationship has been healing for both of them..   Suicidal/Homicidal: No  Therapist Response: The changes she has made is amazing.  She is so much more comfortable with herself  Plan: Return again in 2 weeks.  Diagnosis: Axis I: Dissociative Identity Disorder    Axis II: Deferred    Darsha Zumstein,[name], LCSW 08/26/2013

## 2013-08-26 NOTE — Progress Notes (Unsigned)
   THERAPIST PROGRESS NOTE  Session Time: ***  Participation Level: Active  Behavioral Response: CasualAlertEuthymic  Type of Therapy: Individual Therapy  Treatment Goals addressed: Anxiety  Interventions: Motivational Interviewing and Supportive  Summary: Neliah Patrici Ranks is a 52 y.o. female who presents with ***.   Suicidal/Homicidal: No  Therapist Response: ***  Plan: Return again in 2 weeks.  Diagnosis: Axis I: Dissociative Identity Disorder    Axis II: Deferred    Shatyra Becka,[name], LCSW 08/26/2013

## 2013-08-26 NOTE — Progress Notes (Unsigned)
   THERAPIST PROGRESS NOTE  Session Time: ***  Participation Level: Active  Behavioral Response: CasualAlertEuphoric and Euthymic  Type of Therapy: Individual Therapy  Treatment Goals addressed: Anxiety  Interventions: Motivational Interviewing and Supportive  Summary: Autumn Garcia is a 52 y.o. female who presents with ***.   Suicidal/Homicidal: No  Therapist Response: ***  Plan: Return again in 2 weeks.  Diagnosis: Axis I: Dissociative Identity Disorder    Axis II: Deferred    Lourdes Manning,[name], LCSW 08/26/2013

## 2013-08-27 ENCOUNTER — Ambulatory Visit (HOSPITAL_COMMUNITY): Payer: Self-pay | Admitting: Licensed Clinical Social Worker

## 2013-09-02 ENCOUNTER — Ambulatory Visit: Payer: Self-pay | Admitting: Family Medicine

## 2013-09-04 DIAGNOSIS — L293 Anogenital pruritus, unspecified: Secondary | ICD-10-CM | POA: Diagnosis not present

## 2013-09-04 DIAGNOSIS — R300 Dysuria: Secondary | ICD-10-CM | POA: Diagnosis not present

## 2013-10-01 DIAGNOSIS — G459 Transient cerebral ischemic attack, unspecified: Secondary | ICD-10-CM | POA: Diagnosis not present

## 2013-10-01 DIAGNOSIS — G5600 Carpal tunnel syndrome, unspecified upper limb: Secondary | ICD-10-CM | POA: Diagnosis not present

## 2013-10-01 DIAGNOSIS — R209 Unspecified disturbances of skin sensation: Secondary | ICD-10-CM | POA: Diagnosis not present

## 2013-10-01 DIAGNOSIS — F438 Other reactions to severe stress: Secondary | ICD-10-CM | POA: Diagnosis not present

## 2013-10-01 DIAGNOSIS — F4389 Other reactions to severe stress: Secondary | ICD-10-CM | POA: Diagnosis not present

## 2013-10-07 ENCOUNTER — Ambulatory Visit (INDEPENDENT_AMBULATORY_CARE_PROVIDER_SITE_OTHER): Payer: Medicare Other | Admitting: Family Medicine

## 2013-10-07 ENCOUNTER — Encounter: Payer: Self-pay | Admitting: Family Medicine

## 2013-10-07 VITALS — BP 111/70 | HR 61 | Temp 98.6°F | Ht 64.0 in | Wt 155.0 lb

## 2013-10-07 DIAGNOSIS — Z1231 Encounter for screening mammogram for malignant neoplasm of breast: Secondary | ICD-10-CM

## 2013-10-07 DIAGNOSIS — G5600 Carpal tunnel syndrome, unspecified upper limb: Secondary | ICD-10-CM | POA: Diagnosis not present

## 2013-10-07 DIAGNOSIS — F411 Generalized anxiety disorder: Secondary | ICD-10-CM

## 2013-10-07 MED ORDER — MELATONIN 5 MG PO TABS: 1.0000 | ORAL_TABLET | Freq: Every day | ORAL | Status: DC

## 2013-10-07 MED ORDER — DIAZEPAM 2 MG PO TABS: 2.0000 mg | ORAL_TABLET | Freq: Three times a day (TID) | ORAL | Status: DC | PRN
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
## 2013-10-08 DIAGNOSIS — R569 Unspecified convulsions: Secondary | ICD-10-CM | POA: Diagnosis not present

## 2013-10-20 DIAGNOSIS — R42 Dizziness and giddiness: Secondary | ICD-10-CM | POA: Diagnosis not present

## 2013-10-20 DIAGNOSIS — F4389 Other reactions to severe stress: Secondary | ICD-10-CM | POA: Diagnosis not present

## 2013-10-20 DIAGNOSIS — G5620 Lesion of ulnar nerve, unspecified upper limb: Secondary | ICD-10-CM | POA: Diagnosis not present

## 2013-10-20 DIAGNOSIS — G5600 Carpal tunnel syndrome, unspecified upper limb: Secondary | ICD-10-CM | POA: Diagnosis not present

## 2013-10-20 DIAGNOSIS — F438 Other reactions to severe stress: Secondary | ICD-10-CM | POA: Diagnosis not present

## 2013-10-20 DIAGNOSIS — G459 Transient cerebral ischemic attack, unspecified: Secondary | ICD-10-CM | POA: Diagnosis not present

## 2013-10-27 ENCOUNTER — Ambulatory Visit (HOSPITAL_COMMUNITY): Payer: Self-pay | Admitting: Psychiatry

## 2013-10-29 ENCOUNTER — Encounter: Payer: Self-pay | Admitting: *Deleted

## 2013-10-29 DIAGNOSIS — E559 Vitamin D deficiency, unspecified: Secondary | ICD-10-CM | POA: Insufficient documentation

## 2013-10-29 DIAGNOSIS — C4491 Basal cell carcinoma of skin, unspecified: Secondary | ICD-10-CM | POA: Insufficient documentation

## 2013-10-30 ENCOUNTER — Ambulatory Visit (INDEPENDENT_AMBULATORY_CARE_PROVIDER_SITE_OTHER): Payer: Medicare Other | Admitting: Psychiatry

## 2013-10-30 ENCOUNTER — Encounter (HOSPITAL_COMMUNITY): Payer: Self-pay | Admitting: Psychiatry

## 2013-10-30 VITALS — BP 112/70 | HR 56 | Wt 155.0 lb

## 2013-10-30 DIAGNOSIS — F4310 Post-traumatic stress disorder, unspecified: Secondary | ICD-10-CM | POA: Diagnosis not present

## 2013-10-30 DIAGNOSIS — F4481 Dissociative identity disorder: Secondary | ICD-10-CM

## 2013-10-30 DIAGNOSIS — F411 Generalized anxiety disorder: Secondary | ICD-10-CM | POA: Diagnosis not present

## 2013-10-30 NOTE — Progress Notes (Signed)
[facility] Follow-up Outpatient Visit  Autumn Garcia [birth year] [id number] 53 y.o. 10/30/2013 10:03 AM  Chief Complaint: Follow up  History of Present Illness: HPI Comments: [name] is  a 53 y/o female with a past psychiatric history significant for PTSD along with disassociative identity disorder and pseudoseizures. The patient is referred for psychiatric services for  medication management.    . Location: The patient reports she has has had continued anxiety and some irritability.   . Quality: The patient reports that her main stressors are:  "death" -she fears death.  She come in today with a stuffed toy bear, her "seizure" therapy dog a her husband. Patient reports that she is starting with another therapist here at this clinic, but continues to miss her old therapist.  She reports that she does have lapses of memory about events that occur during the day.  In the area of affective symptoms, patient appears anxious. Patient denies current suicidal ideation, intent, or plan. Patient denies current homicidal ideation, intent, or plan. Patient denies auditory hallucinations. Patient denies visual hallucinations. Patient reports some symptoms of paranoia regarding one of her split personalities take her over. Patient states sleep is fair, with approximately 4-8 hours of sleep per night. Appetite is varies depending on her mood. Energy level is poor. Patient denies symptoms of anhedonia. Patient denies hopelessness, helplessness, or guilt.   . Severity: Depression: 3-10/10 (0=Very depressed; 5=Neutral; 10=Very Happy)  Anxiety- 7-10/10(0=no anxiety; 5= moderate/tolerable anxiety; 10= panic attacks)  . Duration: Childhood  . Timing: No specific timing . Context: Worse with fatigue . Modifying factors: Improves wih being aroung her husband . Associated signs and symptoms (e.g., loss of appetite, loss of weight, loss of sexual interest)  Denies any recent  episodes consistent with mania, particularly decreased need for sleep with increased energy, grandiosity, impulsivity, hyperverbal and pressured speech, or increased productivity. Denies any  recent symptoms consistent with psychosis, particularly auditory or visual hallucinations, thought broadcasting/insertion/withdrawal, or ideas of reference. Also denies excessive worry to the point of physical symptoms as well as any panic attacks.  Suicidal Ideation: Negative Plan Formed: Negative Patient has means to carry out plan: Negative  Homicidal Ideation: Negative Plan Formed: Negative Patient has means to carry out plan: Negative  Review of Systems: Psychiatric: Agitation: No Hallucination: No Depressed Mood: Yes Insomnia: Yes Hypersomnia: Negative Altered Concentration: Negative Feels Worthless: Yes Grandiose Ideas: Negative Belief In Special Powers: Negative New/Increased Substance Abuse: Negative Compulsions: No  Neurologic: Headache: Negative Seizure: Yes Paresthesias: Negative  Past Medical Family, Social History:   Past Medical History  Diagnosis Date  . Anxiety   . Depression   . PTSD (post-traumatic stress disorder)   . Chronic kidney disease   . Skin cancer of forehead     If could be a more serious cancer and will not know until after surgery  . HSV infection   . TB (tuberculosis)     Tested positive and treated  . Epilepsy   . Kidney stones    Family History  Problem Relation Age of Onset  . Bipolar disorder Mother   . Heart attack Mother   . Bipolar disorder Sister   . Cancer Sister     skin  . Dementia Brother   . Hypertension Brother   . Cancer Sister     breast,brain,cervical ovarian  . Cancer Maternal Aunt     breast  . Cancer Cousin     breast   History   Social History  Narrative   1 caffeinated drink per day. Does yoga for  60 minutes 3 days per week.      Outpatient Encounter Prescriptions as of 10/30/2013  Medication Sig  .  cholecalciferol (VITAMIN D) 1000 UNITS tablet Take 2,000 Units by mouth daily.  . diazepam (VALIUM) 2 MG tablet Take 1 tablet (2 mg total) by mouth every 8 (eight) hours as needed for anxiety.  Marland Kitchen L-Lysine 500 MG TABS Take 500 mg by mouth.  . magnesium 30 MG tablet Take 30 mg by mouth daily.  . Melatonin 5 MG TABS Take 1 tablet (5 mg total) by mouth daily.  . Multiple Vitamins-Minerals (MULTIVITAMIN WITH MINERALS) tablet Take 1 tablet by mouth daily.    Past Psychiatric History/Hospitalization(s): Anxiety: Negative Bipolar Disorder: Negative Depression: Negative Mania: Negative Psychosis: Negative Schizophrenia: Negative Personality Disorder: Negative Hospitalization for psychiatric illness: No History of Electroconvulsive Shock Therapy: No Prior Suicide Attempts: No  Review of Systems  Constitutional: Negative for fever and chills.  Eyes: Positive for blurred vision. Negative for double vision.  Respiratory: Negative for cough, hemoptysis, sputum production and shortness of breath.   Cardiovascular: Negative for chest pain, palpitations and leg swelling.  Gastrointestinal: Positive for nausea. Negative for abdominal pain, diarrhea and constipation.  Genitourinary: Negative for dysuria, urgency and frequency.  Skin: Negative for itching and rash.  Neurological: Positive for dizziness, tremors (Chronic), seizures and loss of consciousness.    Physical Exam:  Constitutional:   BP 112/70  Pulse 56  Wt 155 lb (70.308 kg)  General Appearance: alert, oriented, no acute distress and well nourished Musculoskeletal: Gait & Station: unsteady, -due to vertigo Patient leans: N/A  Psychiatric: General Appearance: Casual and Fairly Groomed  Engineer, water::  Fair  Speech:  Clear and Coherent and Normal Rate  Volume:  Normal  Mood:  "foggy"  Affect:  Appropriate, Congruent and Full Range  Thought Process:  Circumstantial  Orientation:  Full (Time, Place, and Person)  Thought  Content:  WDL  Suicidal Thoughts:  No  Homicidal Thoughts:  No  Memory:  Immediate;   Good Recent;   Poor Remote;   Poor  Judgement:  Good  Insight:  Good  Psychomotor Activity:  Normal  Concentration:  Fair  Akathisia:  No  Handed:  Ambidextrous  AIMS (if indicated):  Not indicated   Assets:  Communication Skills Desire for Improvement Financial Resources/Insurance Housing Intimacy Social Support Talents/Skills  Sleep:  Number of Hours: 4-8     Assessment: Axis I: PTSD along with disassociative identity disorder and pseudoseizures    Plan of Care:  PLAN:  1. Affirm with the patient that the medications are taken as ordered. Patient  expressed understanding of how their medications were to be used.    Laboratory:  Not indicated at this time.   Psychotherapy: Therapy: brief supportive therapy provided.  Discussed psychosocial stressors in detail.  Discussed EMDR therapy with the patient. More than 50% of the visit was spent on individual therapy.   Medications:  Continue the following psychiatric medications as written prior to this appointment with the following changes::  a) valium 2 mg TID B)Offered a trial of an AA but not interested in starting an atypical antipsychotic  -Risks and benefits, side effects and alternatives discussed with patient, he/she was given an opportunity to ask questions about his/her medication, illness, and treatment. All current psychiatric medications have been reviewed and discussed with the patient and adjusted as clinically appropriate. The patient has been provided an accurate and  updated list of the medications being now prescribed.   Routine PRN Medications:  Negative  Consultations: The patient was encouraged to keep all PCP and specialty clinic appointments.   Safety Concerns:   Patient told to call clinic if any problems occur. Patient advised to go to  ER  if she should develop SI/HI, side effects, or if symptoms worsen. Has crisis  numbers to call if needed.    Other:   8. Will discharge patient to PCP. Patient will be referred to the new therapist here.  9. The patient was advised to call and cancel their mental health appointment within 24 hours of the appointment, if they are unable to keep the appointment, as well as the three no show and termination from clinic policy. 10. The patient expressed understanding of the plan and agrees with the above.  Time spent: 30 minutes Coralyn Helling, MD 10/30/2013

## 2013-11-05 ENCOUNTER — Ambulatory Visit (HOSPITAL_COMMUNITY): Payer: Self-pay | Admitting: Psychiatry

## 2013-11-06 DIAGNOSIS — Z905 Acquired absence of kidney: Secondary | ICD-10-CM | POA: Diagnosis not present

## 2013-11-06 DIAGNOSIS — N200 Calculus of kidney: Secondary | ICD-10-CM | POA: Diagnosis not present

## 2013-11-08 DIAGNOSIS — S92919A Unspecified fracture of unspecified toe(s), initial encounter for closed fracture: Secondary | ICD-10-CM | POA: Diagnosis not present

## 2013-11-08 DIAGNOSIS — M542 Cervicalgia: Secondary | ICD-10-CM | POA: Diagnosis not present

## 2013-11-08 DIAGNOSIS — G5600 Carpal tunnel syndrome, unspecified upper limb: Secondary | ICD-10-CM | POA: Diagnosis not present

## 2013-11-12 ENCOUNTER — Ambulatory Visit (HOSPITAL_COMMUNITY): Payer: Self-pay | Admitting: Psychiatry

## 2013-11-13 ENCOUNTER — Encounter: Payer: Self-pay | Admitting: Family Medicine

## 2013-11-13 DIAGNOSIS — F449 Dissociative and conversion disorder, unspecified: Secondary | ICD-10-CM | POA: Diagnosis not present

## 2013-11-13 DIAGNOSIS — R569 Unspecified convulsions: Secondary | ICD-10-CM | POA: Diagnosis not present

## 2013-11-13 DIAGNOSIS — R42 Dizziness and giddiness: Secondary | ICD-10-CM | POA: Diagnosis not present

## 2013-11-13 DIAGNOSIS — G459 Transient cerebral ischemic attack, unspecified: Secondary | ICD-10-CM | POA: Diagnosis not present

## 2013-11-13 DIAGNOSIS — G5620 Lesion of ulnar nerve, unspecified upper limb: Secondary | ICD-10-CM | POA: Diagnosis not present

## 2013-11-19 ENCOUNTER — Ambulatory Visit (HOSPITAL_COMMUNITY): Payer: Self-pay | Admitting: Psychiatry

## 2013-11-20 ENCOUNTER — Ambulatory Visit: Payer: Self-pay

## 2013-11-27 ENCOUNTER — Ambulatory Visit: Payer: Self-pay

## 2013-12-01 ENCOUNTER — Encounter: Payer: Self-pay | Admitting: Physician Assistant

## 2013-12-01 ENCOUNTER — Ambulatory Visit (INDEPENDENT_AMBULATORY_CARE_PROVIDER_SITE_OTHER): Payer: Medicare Other | Admitting: Physician Assistant

## 2013-12-01 VITALS — BP 110/71 | HR 77 | Temp 98.1°F

## 2013-12-01 DIAGNOSIS — R05 Cough: Secondary | ICD-10-CM | POA: Diagnosis not present

## 2013-12-01 DIAGNOSIS — J069 Acute upper respiratory infection, unspecified: Secondary | ICD-10-CM | POA: Diagnosis not present

## 2013-12-01 DIAGNOSIS — R059 Cough, unspecified: Secondary | ICD-10-CM

## 2013-12-01 MED ORDER — BENZONATATE 200 MG PO CAPS: 200.0000 mg | ORAL_CAPSULE | Freq: Two times a day (BID) | ORAL | Status: DC | PRN

## 2013-12-01 NOTE — Progress Notes (Signed)
   Subjective:    Patient ID: Autumn Garcia, female    DOB: 07-Mar-1961, 53 y.o.   MRN: [id number]  HPI Patient is a 53 year old female who presents to the clinic with upper respiratory symptoms for the last 4 days. She does like she's getting some better but her coughing seems to be getting worse. She denies any production with cough. She has some left ear pressure. She started with a sore throat that has resolved. She denies any shortness of breath or wheezing. She tried aspirin and did help with symptoms. Her husband was sick before her in this better now. She denies any sinus pressure, fever, nausea, vomiting, diarrhea, abdominal pain.   Review of Systems     Objective:   Physical Exam  Constitutional: She is oriented to person, place, and time. She appears well-developed and well-nourished.  HENT:  Head: Normocephalic and atraumatic.  Right Ear: External ear normal.  Left Ear: External ear normal.  Nose: Nose normal.  Mouth/Throat: Oropharynx is clear and moist. No oropharyngeal exudate.  Bilateral TMs were injected with no signs of infection. No hearing loss.  Negative for any maxillary sinus pressure.  Eyes: Conjunctivae are normal. Right eye exhibits no discharge. Left eye exhibits no discharge.  Neck: Normal range of motion. Neck supple.  Cardiovascular: Normal rate, regular rhythm and normal heart sounds.   Pulmonary/Chest: Effort normal and breath sounds normal. She has no wheezes.  Lymphadenopathy:    She has no cervical adenopathy.  Neurological: She is alert and oriented to person, place, and time.  Skin: Skin is dry.  Psychiatric: She has a normal mood and affect. Her behavior is normal.          Assessment & Plan:  URI/cough-discuss with patient symptomatic care of upper respiratory infection. Patient can certainly call if rebound infection occurs or new symptoms. Encourage Mucinex D6 100 mg 1-2 tabs every 12 hours. I did give Tessalon Perles for cough.  Handout was given. Call if not continuing to improve.

## 2013-12-01 NOTE — Patient Instructions (Addendum)
Mucinex D 600mg  1-2 tablets twice a day.   Upper Respiratory Infection, Adult An upper respiratory infection (URI) is also known as the common cold. It is often caused by a type of germ (virus). Colds are easily spread (contagious). You can pass it to others by kissing, coughing, sneezing, or drinking out of the same glass. Usually, you get better in 1 or 2 weeks.  HOME CARE   Only take medicine as told by your doctor.  Use a warm mist humidifier or breathe in steam from a hot shower.  Drink enough water and fluids to keep your pee (urine) clear or pale yellow.  Get plenty of rest.  Return to work when your temperature is back to normal or as told by your doctor. You may use a face mask and wash your hands to stop your cold from spreading. GET HELP RIGHT AWAY IF:   After the first few days, you feel you are getting worse.  You have questions about your medicine.  You have chills, shortness of breath, or brown or red spit (mucus).  You have yellow or brown snot (nasal discharge) or pain in the face, especially when you bend forward.  You have a fever, puffy (swollen) neck, pain when you swallow, or white spots in the back of your throat.  You have a bad headache, ear pain, sinus pain, or chest pain.  You have a high-pitched whistling sound when you breathe in and out (wheezing).  You have a lasting cough or cough up blood.  You have sore muscles or a stiff neck. MAKE SURE YOU:   Understand these instructions.  Will watch your condition.  Will get help right away if you are not doing well or get worse. Document Released: 03/06/2008 Document Revised: 12/11/2011 Document Reviewed: 01/23/2011 [facility] Patient Information 2014 [place], [place].

## 2013-12-02 ENCOUNTER — Ambulatory Visit: Payer: Self-pay

## 2013-12-10 DIAGNOSIS — G930 Cerebral cysts: Secondary | ICD-10-CM | POA: Diagnosis not present

## 2013-12-10 DIAGNOSIS — R569 Unspecified convulsions: Secondary | ICD-10-CM | POA: Diagnosis not present

## 2013-12-10 DIAGNOSIS — G459 Transient cerebral ischemic attack, unspecified: Secondary | ICD-10-CM | POA: Diagnosis not present

## 2013-12-11 ENCOUNTER — Encounter: Payer: Self-pay | Admitting: Family Medicine

## 2014-01-06 ENCOUNTER — Encounter: Payer: Self-pay | Admitting: Physician Assistant

## 2014-01-06 ENCOUNTER — Ambulatory Visit (INDEPENDENT_AMBULATORY_CARE_PROVIDER_SITE_OTHER): Payer: Medicare Other | Admitting: Physician Assistant

## 2014-01-06 VITALS — BP 124/77 | HR 73 | Temp 98.1°F | Ht 64.0 in | Wt 159.0 lb

## 2014-01-06 DIAGNOSIS — Z91038 Other insect allergy status: Secondary | ICD-10-CM | POA: Diagnosis not present

## 2014-01-06 DIAGNOSIS — Z91030 Bee allergy status: Secondary | ICD-10-CM

## 2014-01-06 DIAGNOSIS — R21 Rash and other nonspecific skin eruption: Secondary | ICD-10-CM | POA: Diagnosis not present

## 2014-01-06 MED ORDER — METHYLPREDNISOLONE (PAK) 4 MG PO TABS: ORAL_TABLET | ORAL | Status: DC

## 2014-01-06 MED ORDER — EPINEPHRINE 0.3 MG/0.3ML IJ SOAJ: 0.3000 mg | Freq: Once | INTRAMUSCULAR | Status: DC

## 2014-01-06 MED ORDER — AMOXICILLIN 500 MG PO CAPS: 500.0000 mg | ORAL_CAPSULE | Freq: Two times a day (BID) | ORAL | Status: DC

## 2014-01-06 NOTE — Progress Notes (Signed)
   Subjective:    Patient ID: Autumn Garcia, female    DOB: September 28, 1961, 53 y.o.   MRN: [id number]  HPI Patient presents to the clinic with a bump on the back of her right hand. It is tender to touch. She denies any fever, chills, nausea or vomiting. She's not been out of the woods. She denies any drainage. She has been messing with it a lot. She feels like there could be another bump next to it but has  been able to see it in the mirror. She denies any pain before the bump. She did not pull any ticks off. She does feel very tired and run down.   Patient questions getting any EpiPen today. She has a severe allergy to bee stings and inset bites. Her whole arm has swollen of before. She has not had any bites this year but worries about her reactions worsening to anaphylaxis.    Review of Systems     Objective:   Physical Exam  Constitutional: She is oriented to person, place, and time. She appears well-developed and well-nourished.  HENT:  Head: Normocephalic and atraumatic.    Right Ear: External ear normal.  Left Ear: External ear normal.  Nose: Nose normal.  Mouth/Throat: Oropharynx is clear and moist.  Eyes: Conjunctivae are normal. Right eye exhibits no discharge. Left eye exhibits no discharge.  Neck: Normal range of motion. Neck supple.  Cardiovascular: Normal rate, regular rhythm and normal heart sounds.   Pulmonary/Chest: Effort normal and breath sounds normal. She has no wheezes.  Lymphadenopathy:    She has no cervical adenopathy.  Neurological: She is alert and oriented to person, place, and time.  Psychiatric: She has a normal mood and affect. Her behavior is normal.          Assessment & Plan:  Rash bug bite- unclear etiology at this point. Appears that could be a bug bite that is infected. do not think it was a tick. It does not have the classic rash around the lesion.  Discussed the idea of shingles but do not feel like it has the pain/burning associated  with shingles. Patient has had shingles before and wonders if this could be a less severe reaction. Discussed it could be.will treat with prednisone and amoxil for 10 days today. Discussed keeping hands away and cold compresses. Ibuprofen may also help.  call if not improving.   Bee sting/insect allergy-EpiPen for patient to pick up at the pharmacy. I did suggest that patient call 911 if having a severe allergy and they could get better instructions on if it's appropriate to use EpiPen or not. I discussed with patient anytime patient is having difficulty breathing or lip swelling get to the emergency room and use EpiPen.

## 2014-03-18 DIAGNOSIS — F4310 Post-traumatic stress disorder, unspecified: Secondary | ICD-10-CM | POA: Diagnosis not present

## 2014-03-18 DIAGNOSIS — F449 Dissociative and conversion disorder, unspecified: Secondary | ICD-10-CM | POA: Diagnosis not present

## 2014-04-07 DIAGNOSIS — F449 Dissociative and conversion disorder, unspecified: Secondary | ICD-10-CM | POA: Diagnosis not present

## 2014-04-07 DIAGNOSIS — F4310 Post-traumatic stress disorder, unspecified: Secondary | ICD-10-CM | POA: Diagnosis not present

## 2014-04-20 ENCOUNTER — Encounter: Payer: Self-pay | Admitting: Emergency Medicine

## 2014-04-20 ENCOUNTER — Emergency Department (INDEPENDENT_AMBULATORY_CARE_PROVIDER_SITE_OTHER)
Admission: EM | Admit: 2014-04-20 | Discharge: 2014-04-20 | Disposition: A | Discharge status: Home / Self Care | Payer: Medicare Other | Source: Home / Self Care | Attending: Physician Assistant | Admitting: Physician Assistant

## 2014-04-20 DIAGNOSIS — B029 Zoster without complications: Secondary | ICD-10-CM

## 2014-04-20 MED ORDER — PREDNISONE 50 MG PO TABS: 50.0000 mg | ORAL_TABLET | Freq: Every day | ORAL | Status: DC

## 2014-04-20 MED ORDER — TRIAMCINOLONE ACETONIDE 0.5 % EX CREA: 1.0000 "application " | TOPICAL_CREAM | Freq: Two times a day (BID) | CUTANEOUS | Status: DC

## 2014-04-20 MED ORDER — LIDOCAINE 5 % EX OINT: 1.0000 "application " | TOPICAL_OINTMENT | Freq: Three times a day (TID) | CUTANEOUS | Status: DC | PRN

## 2014-04-20 MED ORDER — VALACYCLOVIR HCL 1 G PO TABS: 1000.0000 mg | ORAL_TABLET | Freq: Three times a day (TID) | ORAL | Status: DC

## 2014-04-20 NOTE — Discharge Instructions (Signed)
Shingles  Shingles is caused by the same virus that causes chickenpox. The first feelings may be pain or tingling. A rash will follow in a couple days. The rash may occur on any area of the body. Long-lasting pain is more likely in an elderly person. It can last months to years. There are medicines that can help prevent pain if you start taking them early.  HOME CARE    Place cool cloths on the rash.   Only take medicine as told by your doctor.   You may use calamine lotion to relieve itchy skin.   Avoid touching:   Babies.   Children with inflamed skin (eczema).   People who have gotten transplanted organs.   People with chronic illnesses, such as leukemia and AIDS.   If the rash is on the face, you may need to see a specialist. Keep all appointments. Shingles must be kept away from the eyes, if possible.   Keep all appointments.   Avoid touching the eyes or eye area, if possible.  GET HELP RIGHT AWAY IF:    You have any pain on the face or eye.   Your medicines do not help.   Your redness or puffiness (swelling) spreads.   You have a fever.   You notice any red lines going away from the rash area.  MAKE SURE YOU:    Understand these instructions.   Will watch your condition.   Will get help right away if you are not doing well or get worse.  Document Released: 03/06/2008 Document Revised: 12/11/2011 Document Reviewed: 03/06/2008  [employer] Patient Information 2015 [employer]. This information is not intended to replace advice given to you by your health care provider. Make sure you discuss any questions you have with your health care provider.

## 2014-04-20 NOTE — ED Notes (Signed)
Right axilla started three days ago 8/10 with activity

## 2014-04-20 NOTE — ED Provider Notes (Signed)
CSN: [id number]     Arrival date & time 04/20/14  1107 History   First MD Initiated Contact with Patient 04/20/14 1134     Chief Complaint  Patient presents with  . Herpes Zoster   (Consider location/radiation/quality/duration/timing/severity/associated sxs/prior Treatment) HPI Pt presents to the clinic with a rash in the right axilla with vesicles on top that started 3 days ago. Pt reports pain 8/10 and describes as burning, deep, throbbing. Pain radiates around the rash even down her right arm and right flank. She has had shingles once before that was confirmed with culture. She has put some calamine lotion on it with no relief. She has taken some ibuprofen with a little relief. She denies any trauma or bug bites. No fever, chills, SOB, headache.   She would also like refill on triamcinolone for her bug bites. She does not have any bites at this time but wants to be able to use for them.   Past Medical History  Diagnosis Date  . Anxiety   . Depression   . PTSD (post-traumatic stress disorder)   . Chronic kidney disease   . Skin cancer of forehead     If could be a more serious cancer and will not know until after surgery  . HSV infection   . TB (tuberculosis)     Tested positive and treated  . Epilepsy   . Kidney stones    Past Surgical History  Procedure Laterality Date  . Cholecystectomy    . Kidney surgery    . Removal of basil cell carcinoma    . Laparoscopic nephrectomy  2010.      Dr. Angeline Slim  at [facility] for large stone  . Left knee surgery    . Cervical polypectomy     Family History  Problem Relation Age of Onset  . Bipolar disorder Mother   . Heart attack Mother   . Bipolar disorder Sister   . Cancer Sister     skin  . Dementia Brother   . Hypertension Brother   . Cancer Sister     breast,brain,cervical ovarian  . Cancer Maternal Aunt     breast  . Cancer Cousin     breast   History  Substance Use Topics  . Smoking status: Never Smoker   . Smokeless  tobacco: Never Used  . Alcohol Use: No     Comment: Occasional wine   OB History   Grav Para Term Preterm Abortions TAB SAB Ect Mult Living   1 0     0   0     Review of Systems  All other systems reviewed and are negative.   Allergies  Demerol; Latex; Nickel; Ciprofloxacin; Citrus; Codeine; Erythromycin; Lactose intolerance (gi); Lidocaine; Meperidine hcl; Peanut oil; and Soy allergy  Home Medications   Prior to Admission medications   Medication Sig Start Date End Date Taking? Authorizing Provider  amoxicillin (AMOXIL) 500 MG capsule Take 1 capsule (500 mg total) by mouth 2 (two) times daily. For 10 days. 01/06/14   Darrielle Pflieger L Saraia Platner, PA-C  cholecalciferol (VITAMIN D) 1000 UNITS tablet Take 2,000 Units by mouth daily.    Historical Provider, MD  diazepam (VALIUM) 2 MG tablet Take 1 tablet (2 mg total) by mouth every 8 (eight) hours as needed for anxiety. 10/07/13   Hali Marry, MD  EPINEPHrine (EPIPEN) 0.3 mg/0.3 mL SOAJ injection Inject 0.3 mLs (0.3 mg total) into the muscle once. 01/06/14   Donella Stade, PA-C  L-Lysine  500 MG TABS Take 500 mg by mouth.    Historical Provider, MD  Melatonin 5 MG TABS Take 1 tablet (5 mg total) by mouth daily. 10/07/13   Hali Marry, MD  methylPREDNIsolone (MEDROL DOSPACK) 4 MG tablet follow package directions 01/06/14   Donella Stade, PA-C  Multiple Vitamins-Minerals (MULTIVITAMIN WITH MINERALS) tablet Take 1 tablet by mouth daily.    Historical Provider, MD   BP 133/79  Pulse 61  Temp(Src) 98.3 F (36.8 C) (Oral)  Ht 5\' 4"  (1.626 m)  Wt 151 lb (68.493 kg)  BMI 25.91 kg/m2  SpO2 100% Physical Exam  Constitutional: She appears well-developed and well-nourished.  HENT:  Head: Normocephalic and atraumatic.  Cardiovascular: Normal rate, regular rhythm and normal heart sounds.   Pulmonary/Chest: Effort normal and breath sounds normal.  Skin:     Psychiatric: She has a normal mood and affect. Her behavior is normal.    ED  Course  Procedures (including critical care time) Labs Review Labs Reviewed  VIRAL CULTURE [facility]    Imaging Review No results found.   MDM   1. Herpes zoster    Obtained viral culture.  Treated for shingles.  Valtrex given for 7 days.  Prednisone given for 5 days.  Lidocaine given for pain control to use up to three times a day.  Pt declined any narcotics for pain.  HO given for symptomatic control.  Consider using tylenol as well for pain control.  Triamcinolone was refilled for pt to use for future bug bites.  Follow up with PCP in one week or if not improving sooner.     Donella Stade, PA-C 04/20/14 1219

## 2014-04-21 NOTE — ED Provider Notes (Signed)
Agree with exam, assessment, and plan.   Kandra Nicolas, MD 04/21/14 [phone]

## 2014-04-26 ENCOUNTER — Encounter: Payer: Self-pay | Admitting: Physician Assistant

## 2014-04-26 DIAGNOSIS — B009 Herpesviral infection, unspecified: Secondary | ICD-10-CM | POA: Insufficient documentation

## 2014-04-27 LAB — VIRAL CULTURE VIRC: Organism ID, Bacteria: DETECTED

## 2014-05-20 DIAGNOSIS — F4310 Post-traumatic stress disorder, unspecified: Secondary | ICD-10-CM | POA: Diagnosis not present

## 2014-05-20 DIAGNOSIS — F449 Dissociative and conversion disorder, unspecified: Secondary | ICD-10-CM | POA: Diagnosis not present

## 2014-05-25 DIAGNOSIS — F4481 Dissociative identity disorder: Secondary | ICD-10-CM | POA: Diagnosis not present

## 2014-05-25 DIAGNOSIS — F4310 Post-traumatic stress disorder, unspecified: Secondary | ICD-10-CM | POA: Diagnosis not present

## 2014-05-30 ENCOUNTER — Encounter: Payer: Self-pay | Admitting: Emergency Medicine

## 2014-05-30 ENCOUNTER — Emergency Department (INDEPENDENT_AMBULATORY_CARE_PROVIDER_SITE_OTHER)
Admission: EM | Admit: 2014-05-30 | Discharge: 2014-05-30 | Disposition: A | Discharge status: Home / Self Care | Payer: Medicare Other | Source: Home / Self Care | Attending: Family Medicine | Admitting: Family Medicine

## 2014-05-30 DIAGNOSIS — N3001 Acute cystitis with hematuria: Secondary | ICD-10-CM

## 2014-05-30 DIAGNOSIS — N3000 Acute cystitis without hematuria: Secondary | ICD-10-CM | POA: Diagnosis not present

## 2014-05-30 DIAGNOSIS — R300 Dysuria: Secondary | ICD-10-CM | POA: Diagnosis not present

## 2014-05-30 LAB — POCT URINALYSIS DIP (MANUAL ENTRY)
Bilirubin, UA: NEGATIVE
Glucose, UA: NEGATIVE
Ketones, POC UA: NEGATIVE
Nitrite, UA: NEGATIVE
Protein Ur, POC: NEGATIVE
Spec Grav, UA: <=1.005
Urobilinogen, UA: 0.2
pH, UA: 5.5

## 2014-05-30 MED ORDER — CEPHALEXIN 500 MG PO CAPS: 500.0000 mg | ORAL_CAPSULE | Freq: Three times a day (TID) | ORAL | Status: DC

## 2014-05-30 NOTE — ED Provider Notes (Signed)
Autumn Garcia is a 53 y.o. female who presents to Urgent Care today for UTI. Patient has a two-day history of urinary urgency frequency and dysuria. She denies any fevers or chills nausea vomiting or diarrhea. Her symptoms are consistent with prior episodes of UTI. 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    Past Medical History  Diagnosis Date  . Anxiety   . Depression   . PTSD (post-traumatic stress disorder)   . Chronic kidney disease   . Skin cancer of forehead     If could be a more serious cancer and will not know until after surgery  . HSV infection   . TB (tuberculosis)     Tested positive and treated  . Epilepsy   . Kidney stones    History  Substance Use Topics  . Smoking status: Never Smoker   . Smokeless tobacco: Never Used  . Alcohol Use: No     Comment: Occasional wine   ROS as above Medications: No current facility-administered medications for this encounter.   Current Outpatient Prescriptions  Medication Sig Dispense Refill  . cephALEXin (KEFLEX) 500 MG capsule Take 1 capsule (500 mg total) by mouth 3 (three) times daily.  21 capsule  0  . cholecalciferol (VITAMIN D) 1000 UNITS tablet Take 2,000 Units by mouth daily.      . diazepam (VALIUM) 2 MG tablet Take 1 tablet (2 mg total) by mouth every 8 (eight) hours as needed for anxiety.  90 tablet  1  . EPINEPHrine (EPIPEN) 0.3 mg/0.3 mL SOAJ injection Inject 0.3 mLs (0.3 mg total) into the muscle once.  1 Device  0  . L-Lysine 500 MG TABS Take 500 mg by mouth.      . lidocaine (XYLOCAINE) 5 % ointment Apply 1 application topically 3 (three) times daily as needed.  50 g  11  . Melatonin 5 MG TABS Take 1 tablet (5 mg total) by mouth daily.    0  . Multiple Vitamins-Minerals (MULTIVITAMIN WITH MINERALS) tablet Take 1 tablet by mouth daily.      Marland Kitchen triamcinolone cream (KENALOG) 0.5 % Apply 1 application topically 2 (two) times daily. To  affected areas.  80 g  0  . valACYclovir (VALTREX) 1000 MG tablet Take 1 tablet (1,000 mg total) by mouth 3 (three) times daily. For 7 days.  21 tablet  0    Exam:  BP 132/72  Pulse 69  Ht 5\' 4"  (1.626 m)  Wt 155 lb (70.308 kg)  BMI 26.59 kg/m2  SpO2 98% Gen: Well NAD HEENT: EOMI,  MMM Lungs: Normal work of breathing. CTABL Heart: RRR no MRG Abd: NABS, Soft. Nondistended, Nontender, no CV angle tenderness to percussion Exts: Brisk capillary refill, warm and well perfused.   Results for orders placed during the hospital encounter of 05/30/14 (from the past 24 hour(s))  POCT URINALYSIS DIP (MANUAL ENTRY)     Status: None   Collection Time    05/30/14  4:14 PM      Result Value Ref Range   Color, UA light yellow     Clarity, UA clear     Glucose, UA neg     Bilirubin, UA negative     Bilirubin, UA negative     Spec Grav, UA <=1.005     Blood, UA small     pH, UA 5.5     Protein Ur, POC negative     Urobilinogen, UA  0.2     Nitrite, UA Negative     Leukocytes, UA moderate (2+)     No results found.  Assessment and Plan: 53 y.o. female with UTI. Culture pending. Treat with Keflex.  Discussed warning signs or symptoms. Please see discharge instructions. Patient expresses understanding.   This note was created using Systems analyst. Any transcription errors are unintended.    Gregor Hams, MD 05/30/14 [phone]

## 2014-05-30 NOTE — ED Notes (Signed)
Pt dysuria started slightly 2 days ago and got worse today.  Burning, constant urge to urinate, increased frequency.  Pt only has one kidney so is concerned about uti.

## 2014-05-30 NOTE — Discharge Instructions (Signed)
Thank you for coming in today. Please take Keflex as directed for one week.  We will call if your urine culture grow something that is resistant to Keflex.  Followup with your primary care Dr. If your belly pain worsens, or you have high fever, bad vomiting, blood in your stool or black tarry stool go to the Emergency Room.    Urinary Tract Infection Urinary tract infections (UTIs) can develop anywhere along your urinary tract. Your urinary tract is your body's drainage system for removing wastes and extra water. Your urinary tract includes two kidneys, two ureters, a bladder, and a urethra. Your kidneys are a pair of bean-shaped organs. Each kidney is about the size of your fist. They are located below your ribs, one on each side of your spine. CAUSES Infections are caused by microbes, which are microscopic organisms, including fungi, viruses, and bacteria. These organisms are so small that they can only be seen through a microscope. Bacteria are the microbes that most commonly cause UTIs. SYMPTOMS  Symptoms of UTIs may vary by age and gender of the patient and by the location of the infection. Symptoms in young women typically include a frequent and intense urge to urinate and a painful, burning feeling in the bladder or urethra during urination. Older women and men are more likely to be tired, shaky, and weak and have muscle aches and abdominal pain. A fever may mean the infection is in your kidneys. Other symptoms of a kidney infection include pain in your back or sides below the ribs, nausea, and vomiting. DIAGNOSIS To diagnose a UTI, your caregiver will ask you about your symptoms. Your caregiver also will ask to provide a urine sample. The urine sample will be tested for bacteria and white blood cells. White blood cells are made by your body to help fight infection. TREATMENT  Typically, UTIs can be treated with medication. Because most UTIs are caused by a bacterial infection, they usually  can be treated with the use of antibiotics. The choice of antibiotic and length of treatment depend on your symptoms and the type of bacteria causing your infection. HOME CARE INSTRUCTIONS  If you were prescribed antibiotics, take them exactly as your caregiver instructs you. Finish the medication even if you feel better after you have only taken some of the medication.  Drink enough water and fluids to keep your urine clear or pale yellow.  Avoid caffeine, tea, and carbonated beverages. They tend to irritate your bladder.  Empty your bladder often. Avoid holding urine for long periods of time.  Empty your bladder before and after sexual intercourse.  After a bowel movement, women should cleanse from front to back. Use each tissue only once. SEEK MEDICAL CARE IF:   You have back pain.  You develop a fever.  Your symptoms do not begin to resolve within 3 days. SEEK IMMEDIATE MEDICAL CARE IF:   You have severe back pain or lower abdominal pain.  You develop chills.  You have nausea or vomiting.  You have continued burning or discomfort with urination. MAKE SURE YOU:   Understand these instructions.  Will watch your condition.  Will get help right away if you are not doing well or get worse. Document Released: 06/28/2005 Document Revised: 03/19/2012 Document Reviewed: 10/27/2011 [facility] Patient Information 2015 [place], [place]. This information is not intended to replace advice given to you by your health care provider. Make sure you discuss any questions you have with your health care provider.

## 2014-06-02 ENCOUNTER — Telehealth: Payer: Self-pay | Admitting: *Deleted

## 2014-06-02 LAB — URINE CULTURE: Colony Count: 60000

## 2014-06-09 DIAGNOSIS — F4310 Post-traumatic stress disorder, unspecified: Secondary | ICD-10-CM | POA: Diagnosis not present

## 2014-06-09 DIAGNOSIS — F4481 Dissociative identity disorder: Secondary | ICD-10-CM | POA: Diagnosis not present

## 2014-06-22 DIAGNOSIS — F4481 Dissociative identity disorder: Secondary | ICD-10-CM | POA: Diagnosis not present

## 2014-06-22 DIAGNOSIS — F449 Dissociative and conversion disorder, unspecified: Secondary | ICD-10-CM | POA: Diagnosis not present

## 2014-06-22 DIAGNOSIS — F4310 Post-traumatic stress disorder, unspecified: Secondary | ICD-10-CM | POA: Diagnosis not present

## 2014-06-29 DIAGNOSIS — F4310 Post-traumatic stress disorder, unspecified: Secondary | ICD-10-CM | POA: Diagnosis not present

## 2014-06-29 DIAGNOSIS — F4481 Dissociative identity disorder: Secondary | ICD-10-CM | POA: Diagnosis not present

## 2014-07-06 DIAGNOSIS — F4481 Dissociative identity disorder: Secondary | ICD-10-CM | POA: Diagnosis not present

## 2014-07-06 DIAGNOSIS — F4310 Post-traumatic stress disorder, unspecified: Secondary | ICD-10-CM | POA: Diagnosis not present

## 2014-07-09 ENCOUNTER — Encounter: Payer: Self-pay | Admitting: Physician Assistant

## 2014-07-09 ENCOUNTER — Ambulatory Visit (INDEPENDENT_AMBULATORY_CARE_PROVIDER_SITE_OTHER): Payer: Medicare Other | Admitting: Physician Assistant

## 2014-07-09 VITALS — BP 128/82 | HR 58 | Ht 64.0 in | Wt 156.0 lb

## 2014-07-09 DIAGNOSIS — R300 Dysuria: Secondary | ICD-10-CM | POA: Diagnosis not present

## 2014-07-09 DIAGNOSIS — R350 Frequency of micturition: Secondary | ICD-10-CM

## 2014-07-09 DIAGNOSIS — N390 Urinary tract infection, site not specified: Secondary | ICD-10-CM | POA: Diagnosis not present

## 2014-07-09 DIAGNOSIS — R82998 Other abnormal findings in urine: Secondary | ICD-10-CM

## 2014-07-09 LAB — POCT URINALYSIS DIPSTICK
Bilirubin, UA: NEGATIVE
Blood, UA: NEGATIVE
Glucose, UA: NEGATIVE
Ketones, UA: NEGATIVE
Nitrite, UA: NEGATIVE
Protein, UA: NEGATIVE
Spec Grav, UA: 1.01
Urobilinogen, UA: 0.2
pH, UA: 7

## 2014-07-09 MED ORDER — AMOXICILLIN-POT CLAVULANATE 875-125 MG PO TABS
1.0000 | ORAL_TABLET | Freq: Two times a day (BID) | ORAL | Status: DC
Start: 2014-07-09 — End: 2014-07-23

## 2014-07-09 NOTE — Patient Instructions (Signed)

## 2014-07-10 NOTE — Progress Notes (Signed)
   Subjective:    Patient ID: Autumn Garcia, female    DOB: 25-Sep-1961, 53 y.o.   MRN: [id number]  HPI Pt presents to the clinic with pelvic pressure, dysuria, increased frequency for one day. Not tried anything. Can't take pyridium. No fever, chills, nausea, vomiting, no abdominal or flank pain.    Review of Systems  All other systems reviewed and are negative.      Objective:   Physical Exam  Constitutional: She appears well-developed and well-nourished.  HENT:  Head: Normocephalic and atraumatic.  Cardiovascular: Normal rate, regular rhythm and normal heart sounds.   Pulmonary/Chest: Effort normal and breath sounds normal.  No CVA tenderness.   Abdominal: Soft. Bowel sounds are normal.  Mild tenderness without guarding or rebound over suprapubic area.   Neurological: She is alert.  Skin: Skin is dry.  Psychiatric: She has a normal mood and affect. Her behavior is normal.          Assessment & Plan:  Dysuira/urinary frequency- .. Results for orders placed in visit on 07/09/14  POCT URINALYSIS DIPSTICK      Result Value Ref Range   Color, UA light yellow     Clarity, UA clear     Glucose, UA neg     Bilirubin, UA neg     Ketones, UA neg     Spec Grav, UA 1.010     Blood, UA neg     pH, UA 7.0     Protein, UA neg     Urobilinogen, UA 0.2     Nitrite, UA neg     Leukocytes, UA Trace     Will culture. Only leuks. Will go ahead and treat. Sent amoxicillin due to hx of good response too and not other medications. Follow up with any worsening symptoms.

## 2014-07-11 LAB — URINE CULTURE: Colony Count: 5000

## 2014-07-13 DIAGNOSIS — F4481 Dissociative identity disorder: Secondary | ICD-10-CM | POA: Diagnosis not present

## 2014-07-13 DIAGNOSIS — F4310 Post-traumatic stress disorder, unspecified: Secondary | ICD-10-CM | POA: Diagnosis not present

## 2014-07-20 DIAGNOSIS — F4310 Post-traumatic stress disorder, unspecified: Secondary | ICD-10-CM | POA: Diagnosis not present

## 2014-07-20 DIAGNOSIS — F4481 Dissociative identity disorder: Secondary | ICD-10-CM | POA: Diagnosis not present

## 2014-07-21 DIAGNOSIS — F4481 Dissociative identity disorder: Secondary | ICD-10-CM | POA: Diagnosis not present

## 2014-07-21 DIAGNOSIS — F4310 Post-traumatic stress disorder, unspecified: Secondary | ICD-10-CM | POA: Diagnosis not present

## 2014-07-23 ENCOUNTER — Ambulatory Visit (INDEPENDENT_AMBULATORY_CARE_PROVIDER_SITE_OTHER): Payer: Medicare Other

## 2014-07-23 ENCOUNTER — Ambulatory Visit (INDEPENDENT_AMBULATORY_CARE_PROVIDER_SITE_OTHER): Payer: Medicare Other | Admitting: Family Medicine

## 2014-07-23 ENCOUNTER — Encounter: Payer: Self-pay | Admitting: Family Medicine

## 2014-07-23 VITALS — BP 126/82 | HR 72 | Temp 98.1°F | Ht 64.0 in | Wt 156.0 lb

## 2014-07-23 DIAGNOSIS — H5711 Ocular pain, right eye: Secondary | ICD-10-CM | POA: Diagnosis not present

## 2014-07-23 DIAGNOSIS — Z1231 Encounter for screening mammogram for malignant neoplasm of breast: Secondary | ICD-10-CM

## 2014-07-23 DIAGNOSIS — H00033 Abscess of eyelid right eye, unspecified eyelid: Secondary | ICD-10-CM | POA: Diagnosis not present

## 2014-07-23 MED ORDER — SULFAMETHOXAZOLE-TMP DS 800-160 MG PO TABS: 1.0000 | ORAL_TABLET | Freq: Two times a day (BID) | ORAL | Status: DC

## 2014-07-23 NOTE — Progress Notes (Signed)
   Subjective:    Patient ID: Autumn Garcia, female    DOB: [birth year], 53 y.o.   MRN: [id number]  HPI Her dog jumped up and scratched R eye 2 days ago. this morning she said that her eye was "gooey"she stated that the dog is up to date on shots. The pain is over the lower lid and it is swollen. No pain over the actual eyeball. Says doesn' t feel like there is anything in it.    She denies any vision changes, still having some pain.    Review of Systems     Objective:   Physical Exam  Constitutional: She appears well-developed and well-nourished.  HENT:  Head: Normocephalic and atraumatic.  Eyes: Conjunctivae and EOM are normal. Pupils are equal, round, and reactive to light.  Skin:  Medial corner of right lower lid is swollen and rd.  No active drainage or open wound.  No erythema or injection of the sclera or eyeball itself.           Assessment & Plan:  Cellulitis of lower eye lid secondary to animal scratch - Will tx with Bactrim, warm compresses. Avoid touching, scratching or rubbing.  F/u if not better on Monday.

## 2014-07-23 NOTE — Patient Instructions (Signed)
Call if not better on Monday

## 2014-07-27 DIAGNOSIS — F4310 Post-traumatic stress disorder, unspecified: Secondary | ICD-10-CM | POA: Diagnosis not present

## 2014-07-27 DIAGNOSIS — F4481 Dissociative identity disorder: Secondary | ICD-10-CM | POA: Diagnosis not present

## 2014-08-03 ENCOUNTER — Encounter: Payer: Self-pay | Admitting: Family Medicine

## 2014-08-03 DIAGNOSIS — F4310 Post-traumatic stress disorder, unspecified: Secondary | ICD-10-CM | POA: Diagnosis not present

## 2014-08-03 DIAGNOSIS — F4481 Dissociative identity disorder: Secondary | ICD-10-CM | POA: Diagnosis not present

## 2014-08-12 DIAGNOSIS — F4481 Dissociative identity disorder: Secondary | ICD-10-CM | POA: Diagnosis not present

## 2014-08-12 DIAGNOSIS — F4310 Post-traumatic stress disorder, unspecified: Secondary | ICD-10-CM | POA: Diagnosis not present

## 2014-08-17 DIAGNOSIS — F4481 Dissociative identity disorder: Secondary | ICD-10-CM | POA: Diagnosis not present

## 2014-08-17 DIAGNOSIS — F4310 Post-traumatic stress disorder, unspecified: Secondary | ICD-10-CM | POA: Diagnosis not present

## 2014-08-25 DIAGNOSIS — F4481 Dissociative identity disorder: Secondary | ICD-10-CM | POA: Diagnosis not present

## 2014-08-25 DIAGNOSIS — F4310 Post-traumatic stress disorder, unspecified: Secondary | ICD-10-CM | POA: Diagnosis not present

## 2014-08-31 DIAGNOSIS — F4481 Dissociative identity disorder: Secondary | ICD-10-CM | POA: Diagnosis not present

## 2014-08-31 DIAGNOSIS — F4310 Post-traumatic stress disorder, unspecified: Secondary | ICD-10-CM | POA: Diagnosis not present

## 2014-09-02 DIAGNOSIS — F4481 Dissociative identity disorder: Secondary | ICD-10-CM | POA: Diagnosis not present

## 2014-09-02 DIAGNOSIS — F4310 Post-traumatic stress disorder, unspecified: Secondary | ICD-10-CM | POA: Diagnosis not present

## 2014-09-07 DIAGNOSIS — F4481 Dissociative identity disorder: Secondary | ICD-10-CM | POA: Diagnosis not present

## 2014-09-07 DIAGNOSIS — F4310 Post-traumatic stress disorder, unspecified: Secondary | ICD-10-CM | POA: Diagnosis not present

## 2014-09-10 ENCOUNTER — Encounter: Payer: Self-pay | Admitting: *Deleted

## 2014-09-10 ENCOUNTER — Emergency Department (INDEPENDENT_AMBULATORY_CARE_PROVIDER_SITE_OTHER)
Admission: EM | Admit: 2014-09-10 | Discharge: 2014-09-10 | Disposition: A | Discharge status: Home / Self Care | Payer: Medicare Other | Source: Home / Self Care | Attending: Emergency Medicine | Admitting: Emergency Medicine

## 2014-09-10 DIAGNOSIS — H01006 Unspecified blepharitis left eye, unspecified eyelid: Secondary | ICD-10-CM

## 2014-09-10 DIAGNOSIS — R300 Dysuria: Secondary | ICD-10-CM

## 2014-09-10 DIAGNOSIS — N3001 Acute cystitis with hematuria: Secondary | ICD-10-CM

## 2014-09-10 DIAGNOSIS — H01003 Unspecified blepharitis right eye, unspecified eyelid: Secondary | ICD-10-CM

## 2014-09-10 LAB — POCT URINALYSIS DIP (MANUAL ENTRY)
Bilirubin, UA: NEGATIVE
Glucose, UA: NEGATIVE
Ketones, POC UA: NEGATIVE
Leukocytes, UA: NEGATIVE
Nitrite, UA: NEGATIVE
Protein Ur, POC: NEGATIVE
Spec Grav, UA: 1.01 (ref 1.005–1.03)
Urobilinogen, UA: 0.2 (ref 0–1)
pH, UA: 7 (ref 5–8)

## 2014-09-10 MED ORDER — DESONIDE 0.05 % EX OINT: 1.0000 "application " | TOPICAL_OINTMENT | Freq: Two times a day (BID) | CUTANEOUS | Status: DC

## 2014-09-10 MED ORDER — AMOXICILLIN 500 MG PO CAPS: 500.0000 mg | ORAL_CAPSULE | Freq: Three times a day (TID) | ORAL | Status: DC

## 2014-09-10 NOTE — ED Provider Notes (Signed)
CSN: [id number]     Arrival date & time 09/10/14  1522 History   First MD Initiated Contact with Patient 09/10/14 1526     Chief Complaint  Patient presents with  . Dysuria  . Eye Problem   (Consider location/radiation/quality/duration/timing/severity/associated sxs/prior Treatment) HPI Pt c/o eye lid swelling and itching x 2 months. She also c/o dysuria x last night. Denies fever. Pt reports Ucx in the past have been positive for strep B.   This is a 53 y.o. female who presents today with UTI symptoms for 2 days.  + dysuria + frequency + urgency No hematuria No vaginal discharge No fever/chills mild lower abdominal pain No nausea No vomiting No back pain No fatigue She denies chance of pregnancy. Has tried over-the-counter measures without improvement.    Past Medical History  Diagnosis Date  . Anxiety   . Depression   . PTSD (post-traumatic stress disorder)   . Chronic kidney disease   . Skin cancer of forehead     If could be a more serious cancer and will not know until after surgery  . HSV infection   . TB (tuberculosis)     Tested positive and treated  . Epilepsy   . Kidney stones    Past Surgical History  Procedure Laterality Date  . Cholecystectomy    . Kidney surgery    . Removal of basil cell carcinoma    . Laparoscopic nephrectomy  2010.      Dr. Angeline Slim  at [facility] for large stone  . Left knee surgery    . Cervical polypectomy     Family History  Problem Relation Age of Onset  . Bipolar disorder Mother   . Heart attack Mother   . Bipolar disorder Sister   . Cancer Sister     skin  . Dementia Brother   . Hypertension Brother   . Cancer Sister     breast,brain,cervical ovarian  . Cancer Maternal Aunt     breast  . Cancer Cousin     breast   History  Substance Use Topics  . Smoking status: Never Smoker   . Smokeless tobacco: Never Used  . Alcohol Use: No     Comment: Occasional wine   OB History    Gravida Para Term Preterm AB TAB SAB  Ectopic Multiple Living   1 0     0   0     Review of Systems  All other systems reviewed and are negative.   Allergies  Demerol; Latex; Nickel; Ciprofloxacin; Citrus; Codeine; Erythromycin; Lactose intolerance (gi); Lidocaine; Meperidine hcl; Peanut oil; and Soy allergy  Home Medications   Prior to Admission medications   Medication Sig Start Date End Date Taking? Authorizing Provider  amoxicillin (AMOXIL) 500 MG capsule Take 1 capsule (500 mg total) by mouth 3 (three) times daily. 09/10/14   Jacqulyn Cane, MD  cholecalciferol (VITAMIN D) 1000 UNITS tablet Take 2,000 Units by mouth daily.    Historical Provider, MD  desonide (DESOWEN) 0.05 % ointment Apply 1 application topically 2 (two) times daily. 09/10/14   Jacqulyn Cane, MD  diazepam (VALIUM) 2 MG tablet Take 1 tablet (2 mg total) by mouth every 8 (eight) hours as needed for anxiety. 10/07/13   Hali Marry, MD  EPINEPHrine (EPIPEN) 0.3 mg/0.3 mL SOAJ injection Inject 0.3 mLs (0.3 mg total) into the muscle once. 01/06/14   Jade L Breeback, PA-C  L-Lysine 500 MG TABS Take 500 mg by mouth.  Historical Provider, MD  lidocaine (XYLOCAINE) 5 % ointment Apply 1 application topically 3 (three) times daily as needed. 04/20/14   Jade L Breeback, PA-C  Melatonin 5 MG TABS Take 1 tablet (5 mg total) by mouth daily. 10/07/13   Hali Marry, MD  Multiple Vitamins-Minerals (MULTIVITAMIN WITH MINERALS) tablet Take 1 tablet by mouth daily.    Historical Provider, MD  sulfamethoxazole-trimethoprim (BACTRIM DS) 800-160 MG per tablet Take 1 tablet by mouth 2 (two) times daily. 07/23/14   Hali Marry, MD  triamcinolone cream (KENALOG) 0.5 % Apply 1 application topically 2 (two) times daily. To affected areas. 04/20/14   Jade L Breeback, PA-C   BP 120/72 mmHg  Pulse 67  Temp(Src) 97.9 F (36.6 C) (Oral)  Resp 18  Ht 5\' 4"  (1.626 m)  Wt 159 lb (72.122 kg)  BMI 27.28 kg/m2  SpO2 100% Physical Exam  Constitutional: She is oriented  to person, place, and time. She appears well-developed and well-nourished. No distress.  HENT:  Head: Normocephalic and atraumatic.  Mouth/Throat: Oropharynx is clear and moist.  Eyes: Conjunctivae and EOM are normal. Pupils are equal, round, and reactive to light. Right eye exhibits no discharge. Left eye exhibits no discharge. No scleral icterus.  Skin of both upper lids are red and inflamed. No other specific lesions. Eye exam otherwise negative  Neck: Normal range of motion. Neck supple.  Cardiovascular: Normal rate, regular rhythm and normal heart sounds.   Pulmonary/Chest: Effort normal and breath sounds normal.  Abdominal: Soft. She exhibits no distension and no mass. There is no hepatosplenomegaly. There is tenderness in the suprapubic area. There is no rebound, no guarding and no CVA tenderness.  Musculoskeletal: Normal range of motion.  Lymphadenopathy:    She has no cervical adenopathy.  Neurological: She is alert and oriented to person, place, and time.  Skin: Skin is warm and dry.  Psychiatric: She has a normal mood and affect.  Nursing note and vitals reviewed.   ED Course  Procedures (including critical care time) Labs Review Labs Reviewed  URINE CULTURE  POCT URINALYSIS DIP (MANUAL ENTRY)   Results for orders placed or performed during the hospital encounter of 09/10/14  POCT urinalysis dipstick (new)  Result Value Ref Range   Color, UA yellow    Clarity, UA cloudy    Glucose, UA neg    Bilirubin, UA negative    Bilirubin, UA negative    Spec Grav, UA 1.010 1.005 - 1.03   Blood, UA trace-intact    pH, UA 7.0 5 - 8   Protein Ur, POC negative    Urobilinogen, UA 0.2 0 - 1   Nitrite, UA Negative    Leukocytes, UA Negative      Imaging Review No results found.   MDM   1. Acute cystitis with hematuria   2. Dysuria   3. Blepharitis of both eyes    blepharitis is likely skin irritation and allergic blepharitis. No evidence of cellulitis of eyelids or  face  Treatment options discussed, as well as risks, benefits, alternatives. Patient and husband voiced understanding and agreement with the following plans: Urine cx Amoxicillin (pt refused any other abx)--(she requests the amoxicillin to cover possible group B strep which she's had in the past causing UTI.) desonide (DESOWEN) 0.05 % ointment Apply 1 application topically 2 (two) times daily.   For the itching and inflammation of the upper eyelids. She understands not to use inside the eye. Follow-up with your primary care doctor  in 5-7 days if not improving, or sooner if symptoms become worse. Precautions discussed. Red flags discussed. Questions invited and answered. Patient voiced understanding and agreement.      Jacqulyn Cane, MD 09/10/14 [phone]

## 2014-09-10 NOTE — ED Notes (Signed)
Pt c/o eye lid swelling and itching x 2 months. She also c/o dysuria x last night. Denies fever. Pt reports Ucx in the past have been positive for strep B.

## 2014-09-12 ENCOUNTER — Telehealth: Payer: Self-pay | Admitting: *Deleted

## 2014-09-12 LAB — URINE CULTURE: Colony Count: 60000

## 2014-09-14 DIAGNOSIS — F4310 Post-traumatic stress disorder, unspecified: Secondary | ICD-10-CM | POA: Diagnosis not present

## 2014-09-14 DIAGNOSIS — F4481 Dissociative identity disorder: Secondary | ICD-10-CM | POA: Diagnosis not present

## 2014-09-18 ENCOUNTER — Telehealth: Payer: Self-pay | Admitting: *Deleted

## 2014-09-21 DIAGNOSIS — F4481 Dissociative identity disorder: Secondary | ICD-10-CM | POA: Diagnosis not present

## 2014-09-21 DIAGNOSIS — F4310 Post-traumatic stress disorder, unspecified: Secondary | ICD-10-CM | POA: Diagnosis not present

## 2014-09-28 DIAGNOSIS — F4310 Post-traumatic stress disorder, unspecified: Secondary | ICD-10-CM | POA: Diagnosis not present

## 2014-09-28 DIAGNOSIS — F4481 Dissociative identity disorder: Secondary | ICD-10-CM | POA: Diagnosis not present

## 2014-10-05 DIAGNOSIS — F4481 Dissociative identity disorder: Secondary | ICD-10-CM | POA: Diagnosis not present

## 2014-10-05 DIAGNOSIS — F4310 Post-traumatic stress disorder, unspecified: Secondary | ICD-10-CM | POA: Diagnosis not present

## 2014-10-12 DIAGNOSIS — F4481 Dissociative identity disorder: Secondary | ICD-10-CM | POA: Diagnosis not present

## 2014-10-12 DIAGNOSIS — F4310 Post-traumatic stress disorder, unspecified: Secondary | ICD-10-CM | POA: Diagnosis not present

## 2014-10-20 DIAGNOSIS — F4481 Dissociative identity disorder: Secondary | ICD-10-CM | POA: Diagnosis not present

## 2014-10-20 DIAGNOSIS — F4310 Post-traumatic stress disorder, unspecified: Secondary | ICD-10-CM | POA: Diagnosis not present

## 2014-10-26 DIAGNOSIS — F4481 Dissociative identity disorder: Secondary | ICD-10-CM | POA: Diagnosis not present

## 2014-10-26 DIAGNOSIS — F4310 Post-traumatic stress disorder, unspecified: Secondary | ICD-10-CM | POA: Diagnosis not present

## 2014-11-02 DIAGNOSIS — F4481 Dissociative identity disorder: Secondary | ICD-10-CM | POA: Diagnosis not present

## 2014-11-02 DIAGNOSIS — F410 Panic disorder [episodic paroxysmal anxiety] without agoraphobia: Secondary | ICD-10-CM | POA: Diagnosis not present

## 2014-11-02 DIAGNOSIS — F4310 Post-traumatic stress disorder, unspecified: Secondary | ICD-10-CM | POA: Diagnosis not present

## 2014-11-09 DIAGNOSIS — F4481 Dissociative identity disorder: Secondary | ICD-10-CM | POA: Diagnosis not present

## 2014-11-09 DIAGNOSIS — F4310 Post-traumatic stress disorder, unspecified: Secondary | ICD-10-CM | POA: Diagnosis not present

## 2014-11-10 DIAGNOSIS — Z87442 Personal history of urinary calculi: Secondary | ICD-10-CM | POA: Diagnosis not present

## 2014-11-10 DIAGNOSIS — R143 Flatulence: Secondary | ICD-10-CM | POA: Diagnosis not present

## 2014-11-10 DIAGNOSIS — N2889 Other specified disorders of kidney and ureter: Secondary | ICD-10-CM | POA: Diagnosis not present

## 2014-11-10 DIAGNOSIS — N132 Hydronephrosis with renal and ureteral calculous obstruction: Secondary | ICD-10-CM | POA: Diagnosis not present

## 2014-11-10 DIAGNOSIS — N200 Calculus of kidney: Secondary | ICD-10-CM | POA: Diagnosis not present

## 2014-11-10 DIAGNOSIS — N281 Cyst of kidney, acquired: Secondary | ICD-10-CM | POA: Diagnosis not present

## 2014-11-10 DIAGNOSIS — Z905 Acquired absence of kidney: Secondary | ICD-10-CM | POA: Diagnosis not present

## 2014-11-23 DIAGNOSIS — F4481 Dissociative identity disorder: Secondary | ICD-10-CM | POA: Diagnosis not present

## 2014-11-23 DIAGNOSIS — F4310 Post-traumatic stress disorder, unspecified: Secondary | ICD-10-CM | POA: Diagnosis not present

## 2014-11-30 DIAGNOSIS — F4481 Dissociative identity disorder: Secondary | ICD-10-CM | POA: Diagnosis not present

## 2014-11-30 DIAGNOSIS — F4310 Post-traumatic stress disorder, unspecified: Secondary | ICD-10-CM | POA: Diagnosis not present

## 2014-12-07 DIAGNOSIS — F4310 Post-traumatic stress disorder, unspecified: Secondary | ICD-10-CM | POA: Diagnosis not present

## 2014-12-07 DIAGNOSIS — F4481 Dissociative identity disorder: Secondary | ICD-10-CM | POA: Diagnosis not present

## 2014-12-19 DIAGNOSIS — F4481 Dissociative identity disorder: Secondary | ICD-10-CM | POA: Diagnosis not present

## 2014-12-19 DIAGNOSIS — F4310 Post-traumatic stress disorder, unspecified: Secondary | ICD-10-CM | POA: Diagnosis not present

## 2014-12-28 ENCOUNTER — Telehealth: Payer: Self-pay | Admitting: Family Medicine

## 2014-12-28 DIAGNOSIS — F4310 Post-traumatic stress disorder, unspecified: Secondary | ICD-10-CM | POA: Diagnosis not present

## 2014-12-28 DIAGNOSIS — F4481 Dissociative identity disorder: Secondary | ICD-10-CM | POA: Diagnosis not present

## 2014-12-28 NOTE — Telephone Encounter (Signed)
Patient is requesting a letter to to relieve her from [employer] because of all her health issues. Please call patient and let her know the status of this. Thanks

## 2014-12-29 ENCOUNTER — Encounter: Payer: Self-pay | Admitting: *Deleted

## 2014-12-29 NOTE — Telephone Encounter (Signed)
Letter written and placed up front for p/u.Autumn Garcia [place]

## 2014-12-29 NOTE — Telephone Encounter (Signed)
We will have it ready for her by tomorrow.

## 2015-01-04 DIAGNOSIS — F4481 Dissociative identity disorder: Secondary | ICD-10-CM | POA: Diagnosis not present

## 2015-01-04 DIAGNOSIS — F4310 Post-traumatic stress disorder, unspecified: Secondary | ICD-10-CM | POA: Diagnosis not present

## 2015-01-25 DIAGNOSIS — F4310 Post-traumatic stress disorder, unspecified: Secondary | ICD-10-CM | POA: Diagnosis not present

## 2015-01-25 DIAGNOSIS — F4481 Dissociative identity disorder: Secondary | ICD-10-CM | POA: Diagnosis not present

## 2015-01-28 ENCOUNTER — Ambulatory Visit (INDEPENDENT_AMBULATORY_CARE_PROVIDER_SITE_OTHER): Payer: Medicare Other | Admitting: Psychiatry

## 2015-01-28 ENCOUNTER — Encounter (HOSPITAL_COMMUNITY): Payer: Self-pay | Admitting: Psychiatry

## 2015-01-28 VITALS — BP 118/78 | HR 60 | Ht 64.0 in | Wt 145.0 lb

## 2015-01-28 DIAGNOSIS — F4310 Post-traumatic stress disorder, unspecified: Secondary | ICD-10-CM | POA: Diagnosis not present

## 2015-01-28 DIAGNOSIS — G4089 Other seizures: Secondary | ICD-10-CM

## 2015-01-28 DIAGNOSIS — F4481 Dissociative identity disorder: Secondary | ICD-10-CM | POA: Diagnosis not present

## 2015-01-28 DIAGNOSIS — F445 Conversion disorder with seizures or convulsions: Secondary | ICD-10-CM

## 2015-01-28 DIAGNOSIS — F411 Generalized anxiety disorder: Secondary | ICD-10-CM

## 2015-01-28 MED ORDER — FLUOXETINE HCL 10 MG PO TABS: 10.0000 mg | ORAL_TABLET | Freq: Every day | ORAL | Status: DC
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
## 2015-02-01 DIAGNOSIS — F4310 Post-traumatic stress disorder, unspecified: Secondary | ICD-10-CM | POA: Diagnosis not present

## 2015-02-01 DIAGNOSIS — F4481 Dissociative identity disorder: Secondary | ICD-10-CM | POA: Diagnosis not present

## 2015-02-04 DIAGNOSIS — L989 Disorder of the skin and subcutaneous tissue, unspecified: Secondary | ICD-10-CM | POA: Diagnosis not present

## 2015-02-04 DIAGNOSIS — D2272 Melanocytic nevi of left lower limb, including hip: Secondary | ICD-10-CM | POA: Diagnosis not present

## 2015-02-04 DIAGNOSIS — C44319 Basal cell carcinoma of skin of other parts of face: Secondary | ICD-10-CM | POA: Diagnosis not present

## 2015-02-04 DIAGNOSIS — D239 Other benign neoplasm of skin, unspecified: Secondary | ICD-10-CM | POA: Diagnosis not present

## 2015-02-04 DIAGNOSIS — D485 Neoplasm of uncertain behavior of skin: Secondary | ICD-10-CM | POA: Diagnosis not present

## 2015-02-04 DIAGNOSIS — Z85828 Personal history of other malignant neoplasm of skin: Secondary | ICD-10-CM | POA: Diagnosis not present

## 2015-02-08 DIAGNOSIS — F4481 Dissociative identity disorder: Secondary | ICD-10-CM | POA: Diagnosis not present

## 2015-02-08 DIAGNOSIS — F4310 Post-traumatic stress disorder, unspecified: Secondary | ICD-10-CM | POA: Diagnosis not present

## 2015-02-15 DIAGNOSIS — F4310 Post-traumatic stress disorder, unspecified: Secondary | ICD-10-CM | POA: Diagnosis not present

## 2015-02-15 DIAGNOSIS — F4481 Dissociative identity disorder: Secondary | ICD-10-CM | POA: Diagnosis not present

## 2015-02-17 DIAGNOSIS — D239 Other benign neoplasm of skin, unspecified: Secondary | ICD-10-CM | POA: Diagnosis not present

## 2015-02-22 DIAGNOSIS — F4481 Dissociative identity disorder: Secondary | ICD-10-CM | POA: Diagnosis not present

## 2015-02-22 DIAGNOSIS — F4310 Post-traumatic stress disorder, unspecified: Secondary | ICD-10-CM | POA: Diagnosis not present

## 2015-03-08 ENCOUNTER — Ambulatory Visit (INDEPENDENT_AMBULATORY_CARE_PROVIDER_SITE_OTHER): Payer: Medicare Other | Admitting: Psychiatry

## 2015-03-08 ENCOUNTER — Encounter (HOSPITAL_COMMUNITY): Payer: Self-pay | Admitting: Psychiatry

## 2015-03-08 VITALS — Ht 64.0 in | Wt 145.0 lb

## 2015-03-08 DIAGNOSIS — G4089 Other seizures: Secondary | ICD-10-CM

## 2015-03-08 DIAGNOSIS — F4481 Dissociative identity disorder: Secondary | ICD-10-CM | POA: Diagnosis not present

## 2015-03-08 DIAGNOSIS — F445 Conversion disorder with seizures or convulsions: Secondary | ICD-10-CM

## 2015-03-08 DIAGNOSIS — F4310 Post-traumatic stress disorder, unspecified: Secondary | ICD-10-CM | POA: Diagnosis not present

## 2015-03-08 DIAGNOSIS — F411 Generalized anxiety disorder: Secondary | ICD-10-CM

## 2015-03-08 MED ORDER — SERTRALINE HCL 50 MG PO TABS: 50.0000 mg | ORAL_TABLET | Freq: Every day | ORAL | Status: DC
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
## 2015-03-15 DIAGNOSIS — F4481 Dissociative identity disorder: Secondary | ICD-10-CM | POA: Diagnosis not present

## 2015-03-15 DIAGNOSIS — F4310 Post-traumatic stress disorder, unspecified: Secondary | ICD-10-CM | POA: Diagnosis not present

## 2015-04-26 ENCOUNTER — Encounter: Payer: Self-pay | Admitting: Family Medicine

## 2015-04-26 ENCOUNTER — Ambulatory Visit (INDEPENDENT_AMBULATORY_CARE_PROVIDER_SITE_OTHER): Payer: Medicare Other | Admitting: Family Medicine

## 2015-04-26 ENCOUNTER — Other Ambulatory Visit (HOSPITAL_COMMUNITY)
Admission: RE | Admit: 2015-04-26 | Discharge: 2015-04-26 | Disposition: A | Discharge status: Home / Self Care | Payer: Medicare Other | Source: Ambulatory Visit | Attending: Family Medicine | Admitting: Family Medicine

## 2015-04-26 VITALS — BP 105/65 | HR 62 | Ht 64.0 in | Wt 156.0 lb

## 2015-04-26 DIAGNOSIS — R531 Weakness: Secondary | ICD-10-CM

## 2015-04-26 DIAGNOSIS — E611 Iron deficiency: Secondary | ICD-10-CM

## 2015-04-26 DIAGNOSIS — D509 Iron deficiency anemia, unspecified: Secondary | ICD-10-CM

## 2015-04-26 DIAGNOSIS — Z7409 Other reduced mobility: Secondary | ICD-10-CM

## 2015-04-26 DIAGNOSIS — F4310 Post-traumatic stress disorder, unspecified: Secondary | ICD-10-CM | POA: Diagnosis not present

## 2015-04-26 DIAGNOSIS — Z114 Encounter for screening for human immunodeficiency virus [HIV]: Secondary | ICD-10-CM

## 2015-04-26 DIAGNOSIS — Z1159 Encounter for screening for other viral diseases: Secondary | ICD-10-CM

## 2015-04-26 DIAGNOSIS — E785 Hyperlipidemia, unspecified: Secondary | ICD-10-CM

## 2015-04-26 DIAGNOSIS — Z8249 Family history of ischemic heart disease and other diseases of the circulatory system: Secondary | ICD-10-CM

## 2015-04-26 DIAGNOSIS — Z13220 Encounter for screening for lipoid disorders: Secondary | ICD-10-CM

## 2015-04-26 DIAGNOSIS — Z124 Encounter for screening for malignant neoplasm of cervix: Secondary | ICD-10-CM

## 2015-04-26 DIAGNOSIS — B009 Herpesviral infection, unspecified: Secondary | ICD-10-CM

## 2015-04-26 DIAGNOSIS — Z0000 Encounter for general adult medical examination without abnormal findings: Secondary | ICD-10-CM

## 2015-04-26 DIAGNOSIS — F4481 Dissociative identity disorder: Secondary | ICD-10-CM | POA: Diagnosis not present

## 2015-04-26 DIAGNOSIS — E559 Vitamin D deficiency, unspecified: Secondary | ICD-10-CM

## 2015-04-26 DIAGNOSIS — R6889 Other general symptoms and signs: Secondary | ICD-10-CM

## 2015-04-26 DIAGNOSIS — R5383 Other fatigue: Secondary | ICD-10-CM

## 2015-04-26 NOTE — Progress Notes (Signed)
Subjective:     Autumn Garcia is a 54 y.o. female and is here for a comprehensive physical exam. The patient reports problems - she is doing therpay with [employer].  She is a Engineer, water here in town. She's been seeing her for most a year now and feels like it's been extremely helpful for her PTSD.   She's also requesting a letter today  For a service dog. They are to have her and they have trained her. She has a history of seizure disorder and the dog has been very helpful.  Has felt more tired and would like to have extra labs checked, etc.   She also is very concerned about her heart.  Says has family history heart disease and is requesting a stress test.  She has not been having chest pain but has noticed a decrease in endurance.    History   Social History  . Marital Status: Married    Spouse Name: [name]  . Number of Children: 0  . Years of Education: N/A   Occupational History  . disability    Social History Main Topics  . Smoking status: Never Smoker   . Smokeless tobacco: Never Used  . Alcohol Use: No     Comment: Occasional wine  . Drug Use: No  . Sexual Activity:    Partners: Male   Other Topics Concern  . Not on file   Social History Narrative   1 caffeinated drink per day. Does yoga for  60 minutes 3 days per week.   Health Maintenance  Topic Date Due  . Hepatitis C Screening  1961/05/06  . HIV Screening  07/10/1975  . COLONOSCOPY  07/09/2010  . INFLUENZA VACCINE  05/03/2015  . PAP SMEAR  10/25/2015  . MAMMOGRAM  07/23/2016  . TETANUS/TDAP  10/02/2018    The following portions of the patient's history were reviewed and updated as appropriate: allergies, current medications, past family history, past medical history, past social history, past surgical history and problem list.  Review of Systems A comprehensive review of systems was negative.   Objective:    BP 105/65 mmHg  Pulse 62  Ht 5\' 4"  (1.626 m)  Wt 156 lb (70.761 kg)  BMI  26.76 kg/m2 General appearance: alert, cooperative and appears stated age Head: Normocephalic, without obvious abnormality, atraumatic Eyes: conj clear, EOMi, PEERLA Ears: normal TM's and external ear canals both ears Nose: Nares normal. Septum midline. Mucosa normal. No drainage or sinus tenderness. Throat: lips, mucosa, and tongue normal; teeth and gums normal Neck: no adenopathy, no carotid bruit, no JVD, supple, symmetrical, trachea midline and thyroid not enlarged, symmetric, no tenderness/mass/nodules Back: symmetric, no curvature. ROM normal. No CVA tenderness. tender over the right SI joint Lungs: clear to auscultation bilaterally Breasts: normal appearance, no masses or tenderness Heart: regular rate and rhythm, S1, S2 normal, no murmur, click, rub or gallop Abdomen: soft, non-tender; bowel sounds normal; no masses,  no organomegaly Pelvic: cervix normal in appearance, external genitalia normal, no adnexal masses or tenderness, no cervical motion tenderness, rectovaginal septum normal, uterus normal size, shape, and consistency and vagina normal without discharge Extremities: extremities normal, atraumatic, no cyanosis or edema Pulses: 2+ and symmetric Skin: Skin color, texture, turgor normal. No rashes or lesions Lymph nodes: Cervical, supraclavicular, and axillary nodes normal. Neurologic: Alert and oriented X 3, normal strength and tone. Normal symmetric reflexes. Normal coordination and gait    Assessment:    Healthy female exam.  Plan:     See After Visit Summary for Counseling Recommendations  Keep up a regular exercise program and make sure you are eating a healthy diet Try to eat 4 servings of dairy a day, or if you are lactose intolerant take a calcium with vitamin D daily.  Your vaccines are up to date.   due for hepatitis C and HIV screening and discussed this with her today.  Discussed colon cancer screening -  She refuses to do a colonoscopy.she is willing  to do the Cologuard. Discussed how the test works.  Form faxed today.    She's also noticed that sometimes if she sits on a chair at home or her leg single she'll start to get pain in her legs. To the point that if she tries to get up it's very difficult to walk. She also gets associated low back pain at the same time. On exam today she was tender over the right SI joint. Discussed can take anti-inflammatory and work on gentle stretches.  Vit D def - has been taking her vit D regularly. Due to recheck levels.

## 2015-04-27 ENCOUNTER — Encounter (HOSPITAL_COMMUNITY): Payer: Self-pay | Admitting: Family Medicine

## 2015-04-27 NOTE — Addendum Note (Signed)
Addended by: Beatrice Lecher D on: 04/27/2015 12:19 PM   Modules accepted: Miquel Dunn

## 2015-04-27 NOTE — Progress Notes (Addendum)
Subjective:    Autumn Garcia is a 54 y.o. female who presents for Medicare Annual/Subsequent preventive examination.  Preventive Screening-Counseling & Management  Tobacco History  Smoking status  . Never Smoker   Smokeless tobacco  . Never Used     Problems Prior to Visit 1.   Current Problems (verified) Patient Active Problem List   Diagnosis Date Noted  . Hyperlipidemia 04/26/2015  . Herpes simplex type II infection 04/26/2014  . Dizziness and giddiness 11/13/2013  . Vitamin D deficiency 10/29/2013  . Basal cell carcinoma of skin 10/29/2013  . Ventral hernia without obstruction or gangrene 01/27/2013  . Dissociative disorder 06/03/2012  . PTSD (post-traumatic stress disorder) 11/15/2011    Class: Chronic  . Skin cancer of forehead 11/15/2011    Class: Chronic  . Dissociative identity disorder 10/06/2011    Class: Chronic  . HOT FLASHES 09/03/2009  . ANXIETY DISORDER, GENERALIZED 06/14/2009  . KNEE PAIN 01/26/2009  . Other convulsions 01/26/2009    Medications Prior to Visit Current Outpatient Prescriptions on File Prior to Visit  Medication Sig Dispense Refill  . cholecalciferol (VITAMIN D) 1000 UNITS tablet Take 2,000 Units by mouth daily.    Marland Kitchen desonide (DESOWEN) 0.05 % ointment Apply 1 application topically 2 (two) times daily. 15 g 0  . diazepam (VALIUM) 2 MG tablet Take 1 tablet (2 mg total) by mouth every 8 (eight) hours as needed for anxiety. 90 tablet 1  . EPINEPHrine (EPIPEN) 0.3 mg/0.3 mL SOAJ injection Inject 0.3 mLs (0.3 mg total) into the muscle once. 1 Device 0  . L-Lysine 500 MG TABS Take 500 mg by mouth.    . lidocaine (XYLOCAINE) 5 % ointment Apply 1 application topically 3 (three) times daily as needed. 50 g 11  . Melatonin 5 MG TABS Take 1 tablet (5 mg total) by mouth daily.  0  . Multiple Vitamins-Minerals (MULTIVITAMIN WITH MINERALS) tablet Take 1 tablet by mouth daily.    . sertraline (ZOLOFT) 50 MG tablet Take 1 tablet (50 mg  total) by mouth daily. 30 tablet 1  . sulfamethoxazole-trimethoprim (BACTRIM DS) 800-160 MG per tablet Take 1 tablet by mouth 2 (two) times daily. 20 tablet 0  . triamcinolone cream (KENALOG) 0.5 % Apply 1 application topically 2 (two) times daily. To affected areas. 80 g 0   No current facility-administered medications on file prior to visit.    Current Medications (verified) Current Outpatient Prescriptions  Medication Sig Dispense Refill  . cholecalciferol (VITAMIN D) 1000 UNITS tablet Take 2,000 Units by mouth daily.    Marland Kitchen desonide (DESOWEN) 0.05 % ointment Apply 1 application topically 2 (two) times daily. 15 g 0  . diazepam (VALIUM) 2 MG tablet Take 1 tablet (2 mg total) by mouth every 8 (eight) hours as needed for anxiety. 90 tablet 1  . EPINEPHrine (EPIPEN) 0.3 mg/0.3 mL SOAJ injection Inject 0.3 mLs (0.3 mg total) into the muscle once. 1 Device 0  . L-Lysine 500 MG TABS Take 500 mg by mouth.    . lidocaine (XYLOCAINE) 5 % ointment Apply 1 application topically 3 (three) times daily as needed. 50 g 11  . Melatonin 5 MG TABS Take 1 tablet (5 mg total) by mouth daily.  0  . Multiple Vitamins-Minerals (MULTIVITAMIN WITH MINERALS) tablet Take 1 tablet by mouth daily.    . sertraline (ZOLOFT) 50 MG tablet Take 1 tablet (50 mg total) by mouth daily. 30 tablet 1  . sulfamethoxazole-trimethoprim (BACTRIM DS) 800-160 MG per tablet Take 1  tablet by mouth 2 (two) times daily. 20 tablet 0  . triamcinolone cream (KENALOG) 0.5 % Apply 1 application topically 2 (two) times daily. To affected areas. 80 g 0   No current facility-administered medications for this visit.     Allergies (verified) Demerol; Latex; Nickel; Ciprofloxacin; Citrus; Codeine; Erythromycin; Lactose intolerance (gi); Lidocaine; Meperidine hcl; Peanut oil; and Soy allergy   PAST HISTORY  Family History Family History  Problem Relation Age of Onset  . Bipolar disorder Mother   . Heart attack Mother   . Bipolar disorder  Sister   . Cancer Sister     skin  . Dementia Brother   . Hypertension Brother   . Cancer Sister     breast,brain,cervical ovarian  . Cancer Maternal Aunt     breast  . Cancer Cousin     breast    Social History History  Substance Use Topics  . Smoking status: Never Smoker   . Smokeless tobacco: Never Used  . Alcohol Use: No     Comment: Occasional wine     Are there smokers in your home (other than you)? No  Risk Factors Current exercise habits: yoga  Dietary issues discussed: None   Cardiac risk factors: sedentary lifestyle.  Depression Screen (Note: if answer to either of the following is "Yes", a more complete depression screening is indicated)   Over the past two weeks, have you felt down, depressed or hopeless? Yes  Over the past two weeks, have you felt little interest or pleasure in doing things? Yes  Have you lost interest or pleasure in daily life? Yes  Do you often feel hopeless? Yes  Do you cry easily over simple problems? Yes  Activities of Daily Living In your present state of health, do you have any difficulty performing the following activities?:  Driving? Yes Managing money?  Yes Feeding yourself? No Getting from bed to chair? No  Climbing a flight of stairs? No Preparing food and eating?: No Bathing or showering? No Getting dressed: No Getting to the toilet? No Using the toilet:No Moving around from place to place: No In the past year have you fallen or had a near fall?:seizures   Are you sexually active?  Yes  Do you have more than one partner?  No  Hearing Difficulties: No Do you often ask people to speak up or repeat themselves? No Do you experience ringing or noises in your ears? No Do you have difficulty understanding soft or whispered voices? No   Do you feel that you have a problem with memory? Yes  Do you often misplace items? Yes  Do you feel safe at home?  Yes  Cognitive Testing  Alert? Yes  Normal Appearance?Yes  Oriented  to person? Yes  Place? Yes   Time? Yes  Recall of three objects?  Yes  Can perform simple calculations? Yes  Displays appropriate judgment?Yes  Can read the correct time from a watch face?Yes   Advanced Directives have been discussed with the patient? No  List the Names of Other Physician/Practitioners you currently use: 1.  Dr. Nicole Cella - psychologist 2.  Elly Modena - therapist.   Indicate any recent Medical Services you may have received from other than [facility] providers in the past year (date may be approximate).  Immunization History  Administered Date(s) Administered  . Tdap 10/02/2008    Screening Tests Health Maintenance  Topic Date Due  . Hepatitis C Screening  Dec 21, 1960  . HIV Screening  07/10/1975  .  COLONOSCOPY  07/09/2010  . INFLUENZA VACCINE  05/03/2015  . PAP SMEAR  10/25/2015  . MAMMOGRAM  07/23/2016  . TETANUS/TDAP  10/02/2018    All answers were reviewed with the patient and necessary referrals were made:  Braylynn Ghan, MD   04/27/2015   History reviewed: allergies, current medications, past family history, past medical history, past social history, past surgical history and problem list  Review of Systems Review of systems not obtained due to patient factors.    Objective:     Vision by Snellen chart: eye exam is UTD. See vision results form today.   Body mass index is 26.76 kg/(m^2). BP 105/65 mmHg  Pulse 62  Ht 5\' 4"  (1.626 m)  Wt 156 lb (70.761 kg)  BMI 26.76 kg/m2  See additional note.       Assessment:     Medicare wellness exam       Plan:     During the course of the visit the patient was educated and counseled about appropriate screening and preventive services including:    see additional notes   Diet review for nutrition referral? Yes ____  Not Indicated _X_   Patient Instructions (the written plan) was given to the patient.  Medicare Attestation I have personally reviewed: The patient's medical and social  history Their use of alcohol, tobacco or illicit drugs Their current medications and supplements The patient's functional ability including ADLs,fall risks, home safety risks, cognitive, and hearing and visual impairment Diet and physical activities Evidence for depression or mood disorders  The patient's weight, height, BMI, and visual acuity have been recorded in the chart.  I have made referrals, counseling, and provided education to the patient based on review of the above and I have provided the patient with a written personalized care plan for preventive services.     Elgin Carn, MD   04/27/2015

## 2015-04-28 LAB — CYTOLOGY - PAP

## 2015-04-29 NOTE — Progress Notes (Signed)
Quick Note:  Call patient: Your Pap smear is normal. Repeat in 3 years. ______ 

## 2015-05-10 DIAGNOSIS — F4310 Post-traumatic stress disorder, unspecified: Secondary | ICD-10-CM | POA: Diagnosis not present

## 2015-05-10 DIAGNOSIS — F4481 Dissociative identity disorder: Secondary | ICD-10-CM | POA: Diagnosis not present

## 2015-05-11 DIAGNOSIS — Z1212 Encounter for screening for malignant neoplasm of rectum: Secondary | ICD-10-CM | POA: Diagnosis not present

## 2015-05-11 DIAGNOSIS — Z1211 Encounter for screening for malignant neoplasm of colon: Secondary | ICD-10-CM | POA: Diagnosis not present

## 2015-05-13 ENCOUNTER — Telehealth: Payer: Self-pay | Admitting: Family Medicine

## 2015-05-13 NOTE — Telephone Encounter (Signed)
Pt stopped by. Pharmacy has not received  rx for Epinephrine as yet. Thank you.

## 2015-05-14 MED ORDER — EPINEPHRINE 0.3 MG/0.3ML IJ SOAJ: 0.3000 mg | Freq: Once | INTRAMUSCULAR | Status: DC

## 2015-05-14 NOTE — Telephone Encounter (Signed)
Medication refilled

## 2015-05-16 ENCOUNTER — Emergency Department (INDEPENDENT_AMBULATORY_CARE_PROVIDER_SITE_OTHER)
Admission: EM | Admit: 2015-05-16 | Discharge: 2015-05-16 | Disposition: A | Discharge status: Home / Self Care | Payer: Medicare Other | Source: Home / Self Care | Attending: Family Medicine | Admitting: Family Medicine

## 2015-05-16 ENCOUNTER — Encounter: Payer: Self-pay | Admitting: *Deleted

## 2015-05-16 ENCOUNTER — Telehealth: Payer: Self-pay | Admitting: *Deleted

## 2015-05-16 DIAGNOSIS — R51 Headache: Secondary | ICD-10-CM

## 2015-05-16 DIAGNOSIS — R519 Headache, unspecified: Secondary | ICD-10-CM

## 2015-05-16 DIAGNOSIS — R509 Fever, unspecified: Secondary | ICD-10-CM

## 2015-05-16 DIAGNOSIS — R110 Nausea: Secondary | ICD-10-CM

## 2015-05-16 NOTE — ED Provider Notes (Signed)
CSN: [id number]     Arrival date & time 05/16/15  1334 History   First MD Initiated Contact with Patient 05/16/15 1416     Chief Complaint  Patient presents with  . Headache   (Consider location/radiation/quality/duration/timing/severity/associated sxs/prior Treatment) HPI The patient is a 54 year old female with history of anxiety, depression, PTSD, chronic kidney disease (s/p removal of 1 kidney), epilepsy and kidney stones, presenting to [facility] accompanied by her husband with reports of worsening frontal headache with associated fever of 101 and sinus pain that started last night.  Pt states she feels pressure behind her both eyes but denies change in vision.  Pain is moderate to severe, aching and throbbing. Headache was gradual in onset, similar to prior "migraines" in the past. Pt denies prior hx of being dx with migraines. She has not tried anything for the pain as she is unsure what she is allowed to take for pain due to kidney disease.  She reports associated nausea but no vomiting. Denies recent falls or head trauma. Denies change in balance. Denies numbness, tingling or weakness in arms or legs. No change in speech per her husband. No recent travel or sick contacts. No tick bites or rashes.  Past Medical History  Diagnosis Date  . Anxiety   . Depression   . PTSD (post-traumatic stress disorder)   . Chronic kidney disease   . Skin cancer of forehead     If could be a more serious cancer and will not know until after surgery  . HSV infection   . TB (tuberculosis)     Tested positive and treated  . Epilepsy   . Kidney stones    Past Surgical History  Procedure Laterality Date  . Cholecystectomy    . Kidney surgery    . Removal of basil cell carcinoma    . Laparoscopic nephrectomy  2010.      Dr. Angeline Slim  at [facility] for large stone  . Left knee surgery    . Cervical polypectomy     Family History  Problem Relation Age of Onset  . Bipolar disorder Mother   . Heart attack Mother    . Bipolar disorder Sister   . Cancer Sister     skin  . Dementia Brother   . Hypertension Brother   . Cancer Sister     breast,brain,cervical ovarian  . Cancer Maternal Aunt     breast  . Cancer Cousin     breast   Social History  Substance Use Topics  . Smoking status: Never Smoker   . Smokeless tobacco: Never Used  . Alcohol Use: No     Comment: Occasional wine   OB History    Gravida Para Term Preterm AB TAB SAB Ectopic Multiple Living   1 0     0   0     Review of Systems  Constitutional: Positive for fever ( 101) and fatigue. Negative for chills, diaphoresis and appetite change.  HENT: Positive for sinus pressure and sneezing. Negative for congestion and rhinorrhea.   Eyes: Positive for photophobia and pain. Negative for discharge, itching and visual disturbance.  Respiratory: Negative for cough and shortness of breath.   Cardiovascular: Negative for chest pain and palpitations.  Gastrointestinal: Positive for nausea. Negative for abdominal pain and diarrhea.  Musculoskeletal: Negative for myalgias, joint swelling, arthralgias, neck pain and neck stiffness.  Skin: Negative for rash.  Neurological: Positive for weakness ( generalized) and headaches. Negative for dizziness, tremors, seizures, syncope, facial asymmetry,  speech difficulty and light-headedness.    Allergies  Demerol; Latex; Nickel; Ciprofloxacin; Citrus; Codeine; Erythromycin; Lactose intolerance (gi); Lidocaine; Meperidine hcl; Peanut oil; and Soy allergy  Home Medications   Prior to Admission medications   Medication Sig Start Date End Date Taking? Authorizing Provider  cholecalciferol (VITAMIN D) 1000 UNITS tablet Take 2,000 Units by mouth daily.    Historical Provider, MD  desonide (DESOWEN) 0.05 % ointment Apply 1 application topically 2 (two) times daily. 09/10/14   Jacqulyn Cane, MD  diazepam (VALIUM) 2 MG tablet Take 1 tablet (2 mg total) by mouth every 8 (eight) hours as needed for anxiety.  10/07/13   Hali Marry, MD  EPINEPHrine 0.3 mg/0.3 mL IJ SOAJ injection Inject 0.3 mLs (0.3 mg total) into the muscle once. 05/14/15   Hali Marry, MD  L-Lysine 500 MG TABS Take 500 mg by mouth.    Historical Provider, MD  lidocaine (XYLOCAINE) 5 % ointment Apply 1 application topically 3 (three) times daily as needed. 04/20/14   Jade L Breeback, PA-C  Melatonin 5 MG TABS Take 1 tablet (5 mg total) by mouth daily. 10/07/13   Hali Marry, MD  Multiple Vitamins-Minerals (MULTIVITAMIN WITH MINERALS) tablet Take 1 tablet by mouth daily.    Historical Provider, MD  sertraline (ZOLOFT) 50 MG tablet Take 1 tablet (50 mg total) by mouth daily. 03/08/15   Merian Capron, MD  sulfamethoxazole-trimethoprim (BACTRIM DS) 800-160 MG per tablet Take 1 tablet by mouth 2 (two) times daily. 07/23/14   Hali Marry, MD  triamcinolone cream (KENALOG) 0.5 % Apply 1 application topically 2 (two) times daily. To affected areas. 04/20/14   Jade L Breeback, PA-C   BP 137/74 mmHg  Pulse 87  Temp(Src) 98.9 F (37.2 C) (Oral)  Resp 16  Ht 5\' 4"  (1.626 m)  Wt 154 lb (69.854 kg)  BMI 26.42 kg/m2  SpO2 100% Physical Exam  Constitutional: She is oriented to person, place, and time. She appears well-developed and well-nourished. No distress.  HENT:  Head: Normocephalic and atraumatic.  Right Ear: Hearing, tympanic membrane, external ear and ear canal normal.  Left Ear: Hearing, tympanic membrane, external ear and ear canal normal.  Nose: Nose normal. Right sinus exhibits no maxillary sinus tenderness and no frontal sinus tenderness.  Mouth/Throat: Uvula is midline, oropharynx is clear and moist and mucous membranes are normal.  No tenderness over temporal arteries. No tenderness over TMJ, mild crepitus to bilateral TMJ (pt states she has TMJ syndrome)  Eyes: Conjunctivae and EOM are normal. Pupils are equal, round, and reactive to light. Right eye exhibits no discharge. Left eye exhibits no  discharge. No scleral icterus.  Neck: Normal range of motion. Neck supple.  No nuchal rigidity or meningeal signs.  Cardiovascular: Normal rate, regular rhythm and normal heart sounds.   Pulmonary/Chest: Effort normal and breath sounds normal. No respiratory distress. She has no wheezes. She has no rales. She exhibits no tenderness.  Abdominal: Soft. Bowel sounds are normal. She exhibits no distension and no mass. There is no tenderness. There is no rebound and no guarding.  Musculoskeletal: Normal range of motion.  Neurological: She is alert and oriented to person, place, and time. She has normal strength. No cranial nerve deficit or sensory deficit. She displays a negative Romberg sign. Coordination and gait normal. GCS eye subscore is 4. GCS verbal subscore is 5. GCS motor subscore is 6.  CN II-XII in tact. Speech is clear. Alert to person, place, and time. Able  to follow 2 step commands. Finger to nose coordination-normal. 5/5 strength in upper and lower extremities bilaterally. Sensation normal.  Skin: Skin is warm and dry. She is not diaphoretic.  Nursing note and vitals reviewed.   ED Course  Procedures (including critical care time) Labs Review Labs Reviewed - No data to display  Imaging Review No results found.   MDM   1. Frontal headache   2. Nausea without vomiting   3. Other specified fever    Pt c/o fever and headache. No meningeal signs on exam. No focal neuro deficit. Symptoms started last night, doubt sinus infection due to sudden onset, likely viral illness. Pt is afebrile in UC. HA was gradual in onset, similar to previous. Doubt SAH or temporal arteritis. Discussed medications that could be given in UC including IM toradol, reglan and benadryl. Pt does have 1 kidney, hesitant to take extra medications.   Pt would like to try acetaminophen and benadryl at home. Offered narcotics or nausea medications, pt declined.  Advised to f/u with PCP in 2-3 days if not  improving.  Discussed symptoms that warrant emergent care in the ED. Advised to call 911 or go to closest emergency department.  Pt and husband verbalized understanding and agreement with tx plan.     Noland Fordyce, PA-C 05/16/15 [phone]

## 2015-05-16 NOTE — ED Notes (Signed)
Pt c/o frontal HA, dental pain, pressure behind her eyes, fever 101 and sinus pain x last night. Denies nasal congestion or migraine history.

## 2015-05-17 ENCOUNTER — Other Ambulatory Visit: Payer: Self-pay | Admitting: Family Medicine

## 2015-05-17 DIAGNOSIS — B009 Herpesviral infection, unspecified: Secondary | ICD-10-CM | POA: Diagnosis not present

## 2015-05-17 DIAGNOSIS — D509 Iron deficiency anemia, unspecified: Secondary | ICD-10-CM | POA: Diagnosis not present

## 2015-05-17 DIAGNOSIS — Z1159 Encounter for screening for other viral diseases: Secondary | ICD-10-CM | POA: Diagnosis not present

## 2015-05-17 DIAGNOSIS — Z13220 Encounter for screening for lipoid disorders: Secondary | ICD-10-CM | POA: Diagnosis not present

## 2015-05-17 DIAGNOSIS — Z114 Encounter for screening for human immunodeficiency virus [HIV]: Secondary | ICD-10-CM | POA: Diagnosis not present

## 2015-05-17 DIAGNOSIS — R5383 Other fatigue: Secondary | ICD-10-CM | POA: Diagnosis not present

## 2015-05-17 DIAGNOSIS — E559 Vitamin D deficiency, unspecified: Secondary | ICD-10-CM | POA: Diagnosis not present

## 2015-05-17 DIAGNOSIS — E785 Hyperlipidemia, unspecified: Secondary | ICD-10-CM | POA: Diagnosis not present

## 2015-05-17 DIAGNOSIS — Z0000 Encounter for general adult medical examination without abnormal findings: Secondary | ICD-10-CM | POA: Diagnosis not present

## 2015-05-18 ENCOUNTER — Ambulatory Visit (HOSPITAL_COMMUNITY): Payer: Self-pay | Admitting: Psychiatry

## 2015-05-18 ENCOUNTER — Other Ambulatory Visit: Payer: Self-pay

## 2015-05-18 ENCOUNTER — Telehealth (HOSPITAL_COMMUNITY): Payer: Self-pay

## 2015-05-18 LAB — VITAMIN B12: Vitamin B-12: 679 pg/mL (ref 211–911)

## 2015-05-18 LAB — COMPLETE METABOLIC PANEL WITH GFR
ALT: 26 U/L (ref 6–29)
AST: 26 U/L (ref 10–35)
Albumin: 4.5 g/dL (ref 3.6–5.1)
Alkaline Phosphatase: 89 U/L (ref 33–130)
BUN: 11 mg/dL (ref 7–25)
CO2: 24 mmol/L (ref 20–31)
Calcium: 9.5 mg/dL (ref 8.6–10.4)
Chloride: 104 mmol/L (ref 98–110)
Creat: 0.88 mg/dL (ref 0.50–1.05)
GFR, Est African American: 86 mL/min (ref >=60)
GFR, Est Non African American: 75 mL/min (ref >=60)
Glucose, Bld: 95 mg/dL (ref 65–99)
Potassium: 3.7 mmol/L (ref 3.5–5.3)
Sodium: 138 mmol/L (ref 135–146)
Total Bilirubin: 0.5 mg/dL (ref 0.2–1.2)
Total Protein: 7.5 g/dL (ref 6.1–8.1)

## 2015-05-18 LAB — CBC WITH DIFFERENTIAL/PLATELET
Basophils Absolute: 0.1 10*3/uL (ref 0.0–0.1)
Basophils Relative: 1 % (ref 0–1)
Eosinophils Absolute: 0.1 10*3/uL (ref 0.0–0.7)
Eosinophils Relative: 2 % (ref 0–5)
HCT: 42.5 % (ref 36.0–46.0)
Hemoglobin: 14 g/dL (ref 12.0–15.0)
Lymphocytes Relative: 30 % (ref 12–46)
Lymphs Abs: 1.8 10*3/uL (ref 0.7–4.0)
MCH: 29.8 pg (ref 26.0–34.0)
MCHC: 32.9 g/dL (ref 30.0–36.0)
MCV: 90.4 fL (ref 78.0–100.0)
MPV: 8.9 fL (ref 8.6–12.4)
Monocytes Absolute: 0.3 10*3/uL (ref 0.1–1.0)
Monocytes Relative: 5 % (ref 3–12)
Neutro Abs: 3.7 10*3/uL (ref 1.7–7.7)
Neutrophils Relative %: 62 % (ref 43–77)
Platelets: 260 10*3/uL (ref 150–400)
RBC: 4.7 MIL/uL (ref 3.87–5.11)
RDW: 13.6 % (ref 11.5–15.5)
WBC: 6 10*3/uL (ref 4.0–10.5)

## 2015-05-18 LAB — LIPID PANEL
Cholesterol: 220 mg/dL — ABNORMAL HIGH (ref 125–200)
HDL: 73 mg/dL (ref >=46)
LDL Cholesterol: 132 mg/dL — ABNORMAL HIGH (ref <130)
Total CHOL/HDL Ratio: 3 Ratio (ref <=5.0)
Triglycerides: 77 mg/dL (ref <150)
VLDL: 15 mg/dL (ref <30)

## 2015-05-18 LAB — HEPATITIS C ANTIBODY: HCV Ab: NEGATIVE

## 2015-05-18 LAB — HIV ANTIBODY (ROUTINE TESTING W REFLEX): HIV 1&2 Ab, 4th Generation: NONREACTIVE

## 2015-05-18 LAB — FERRITIN: Ferritin: 122 ng/mL (ref 10–291)

## 2015-05-18 LAB — TSH: TSH: 1.576 u[IU]/mL (ref 0.350–4.500)

## 2015-05-18 LAB — VITAMIN D 25 HYDROXY (VIT D DEFICIENCY, FRACTURES): Vit D, 25-Hydroxy: 38 ng/mL (ref 30–100)

## 2015-05-18 MED ORDER — TRIAMCINOLONE ACETONIDE 0.5 % EX CREA: 1.0000 "application " | TOPICAL_CREAM | Freq: Two times a day (BID) | CUTANEOUS | Status: DC

## 2015-05-18 NOTE — Telephone Encounter (Signed)
Encounter complete. 

## 2015-05-19 ENCOUNTER — Encounter (HOSPITAL_COMMUNITY): Payer: Self-pay

## 2015-05-19 ENCOUNTER — Other Ambulatory Visit: Payer: Self-pay | Admitting: Family Medicine

## 2015-05-19 ENCOUNTER — Telehealth: Payer: Self-pay | Admitting: Family Medicine

## 2015-05-19 NOTE — Telephone Encounter (Signed)
Call patient: Colon cancer screening is negative. Recommend repeat in 3 years.

## 2015-05-20 ENCOUNTER — Inpatient Hospital Stay (HOSPITAL_COMMUNITY): Admission: RE | Admit: 2015-05-20 | Payer: Self-pay | Source: Ambulatory Visit

## 2015-05-24 DIAGNOSIS — F4310 Post-traumatic stress disorder, unspecified: Secondary | ICD-10-CM | POA: Diagnosis not present

## 2015-05-24 DIAGNOSIS — F4481 Dissociative identity disorder: Secondary | ICD-10-CM | POA: Diagnosis not present

## 2015-05-24 NOTE — Telephone Encounter (Signed)
Pt informed.Autumn Garcia  

## 2015-05-28 ENCOUNTER — Ambulatory Visit (INDEPENDENT_AMBULATORY_CARE_PROVIDER_SITE_OTHER): Payer: Medicare Other | Admitting: Family Medicine

## 2015-05-28 ENCOUNTER — Encounter: Payer: Self-pay | Admitting: Family Medicine

## 2015-05-28 VITALS — BP 117/70 | HR 66 | Ht 64.0 in | Wt 154.0 lb

## 2015-05-28 DIAGNOSIS — N644 Mastodynia: Secondary | ICD-10-CM

## 2015-05-28 NOTE — Progress Notes (Signed)
   Subjective:    Patient ID: Autumn Garcia, female    DOB: [birth year], 54 y.o.   MRN: [id number]  HPI Says was lying on the couch and her right breast started hurting. This was 2 days ago. Says it felt like a burning sensation.  She denies any fevers chills or sweats. No fever-like illness. She did have a fever about 2 weeks ago and was seen in urgent care but has not had anything in the last week. She denies any trauma or injury and actually went yesterday to get fitted for some new process. She said she did start with aggressive day without her sports bra on and about halfway through stopped and went and put it on. She has been wearing more soft type support bras lately. She denies any significant caffeine intake.  Right sided abdominal pain/cramping. Want any suggestions to help prevent the cramping.    Review of Systems     Objective:   Physical Exam  Constitutional: She appears well-developed and well-nourished.  HENT:  Head: Normocephalic and atraumatic.  Pulmonary/Chest: Right breast exhibits tenderness. Right breast exhibits no inverted nipple, no mass, no nipple discharge and no skin change. Left breast exhibits no inverted nipple, no mass, no nipple discharge, no skin change and no tenderness.    Neurological: She is alert.  Skin: Skin is warm and dry.  Psychiatric: She has a normal mood and affect. Her behavior is normal.          Assessment & Plan:  Right breast pain - we discussed several potential etiologies including strain on the breast ligaments, change in bra wear, not enough support, caffeine intake, trauma etc. Hormone certainly to play a role as well. Her mammogram is up-to-date and was in October of last year. We'll move forward with diagnostic mammogram and ultrasound for further evaluation. I do not see anything on exam consistent with infection such as mastitis or rash.

## 2015-06-08 ENCOUNTER — Telehealth (HOSPITAL_COMMUNITY): Payer: Self-pay

## 2015-06-08 NOTE — Telephone Encounter (Signed)
Encounter complete. 

## 2015-06-09 ENCOUNTER — Other Ambulatory Visit: Payer: Self-pay

## 2015-06-10 ENCOUNTER — Encounter (HOSPITAL_COMMUNITY): Payer: Self-pay

## 2015-06-15 ENCOUNTER — Ambulatory Visit
Admission: RE | Admit: 2015-06-15 | Discharge: 2015-06-15 | Disposition: A | Discharge status: Home / Self Care | Payer: Medicare Other | Source: Ambulatory Visit | Attending: Family Medicine | Admitting: Family Medicine

## 2015-06-15 DIAGNOSIS — N644 Mastodynia: Secondary | ICD-10-CM

## 2015-07-01 DIAGNOSIS — F4481 Dissociative identity disorder: Secondary | ICD-10-CM | POA: Diagnosis not present

## 2015-07-01 DIAGNOSIS — F4310 Post-traumatic stress disorder, unspecified: Secondary | ICD-10-CM | POA: Diagnosis not present

## 2015-08-30 ENCOUNTER — Encounter: Payer: Self-pay | Admitting: Family Medicine

## 2015-08-30 ENCOUNTER — Ambulatory Visit (INDEPENDENT_AMBULATORY_CARE_PROVIDER_SITE_OTHER): Payer: Medicare Other | Admitting: Family Medicine

## 2015-08-30 ENCOUNTER — Ambulatory Visit (INDEPENDENT_AMBULATORY_CARE_PROVIDER_SITE_OTHER): Payer: Medicare Other

## 2015-08-30 VITALS — BP 115/69 | HR 63 | Temp 98.3°F | Resp 16 | Wt 156.5 lb

## 2015-08-30 DIAGNOSIS — K30 Functional dyspepsia: Secondary | ICD-10-CM

## 2015-08-30 DIAGNOSIS — Z8249 Family history of ischemic heart disease and other diseases of the circulatory system: Secondary | ICD-10-CM

## 2015-08-30 DIAGNOSIS — M19032 Primary osteoarthritis, left wrist: Secondary | ICD-10-CM | POA: Diagnosis not present

## 2015-08-30 DIAGNOSIS — R5383 Other fatigue: Secondary | ICD-10-CM | POA: Diagnosis not present

## 2015-08-30 DIAGNOSIS — Z1231 Encounter for screening mammogram for malignant neoplasm of breast: Secondary | ICD-10-CM

## 2015-08-30 DIAGNOSIS — M25542 Pain in joints of left hand: Secondary | ICD-10-CM | POA: Diagnosis not present

## 2015-08-30 DIAGNOSIS — M19042 Primary osteoarthritis, left hand: Secondary | ICD-10-CM | POA: Diagnosis not present

## 2015-08-30 DIAGNOSIS — M25532 Pain in left wrist: Secondary | ICD-10-CM

## 2015-08-30 DIAGNOSIS — R109 Unspecified abdominal pain: Secondary | ICD-10-CM

## 2015-08-30 DIAGNOSIS — R1013 Epigastric pain: Secondary | ICD-10-CM

## 2015-08-30 IMAGING — CR DG FINGER THUMB 2+V*L*
3 series · 3 of 3 positions shown · non-contrast
Comparison: None in PACs

CLINICAL DATA: Left thumb pain at the carpal metacarpal joint for
the past month without known injury

EXAM:
LEFT THUMB 2+V

[finger ap]
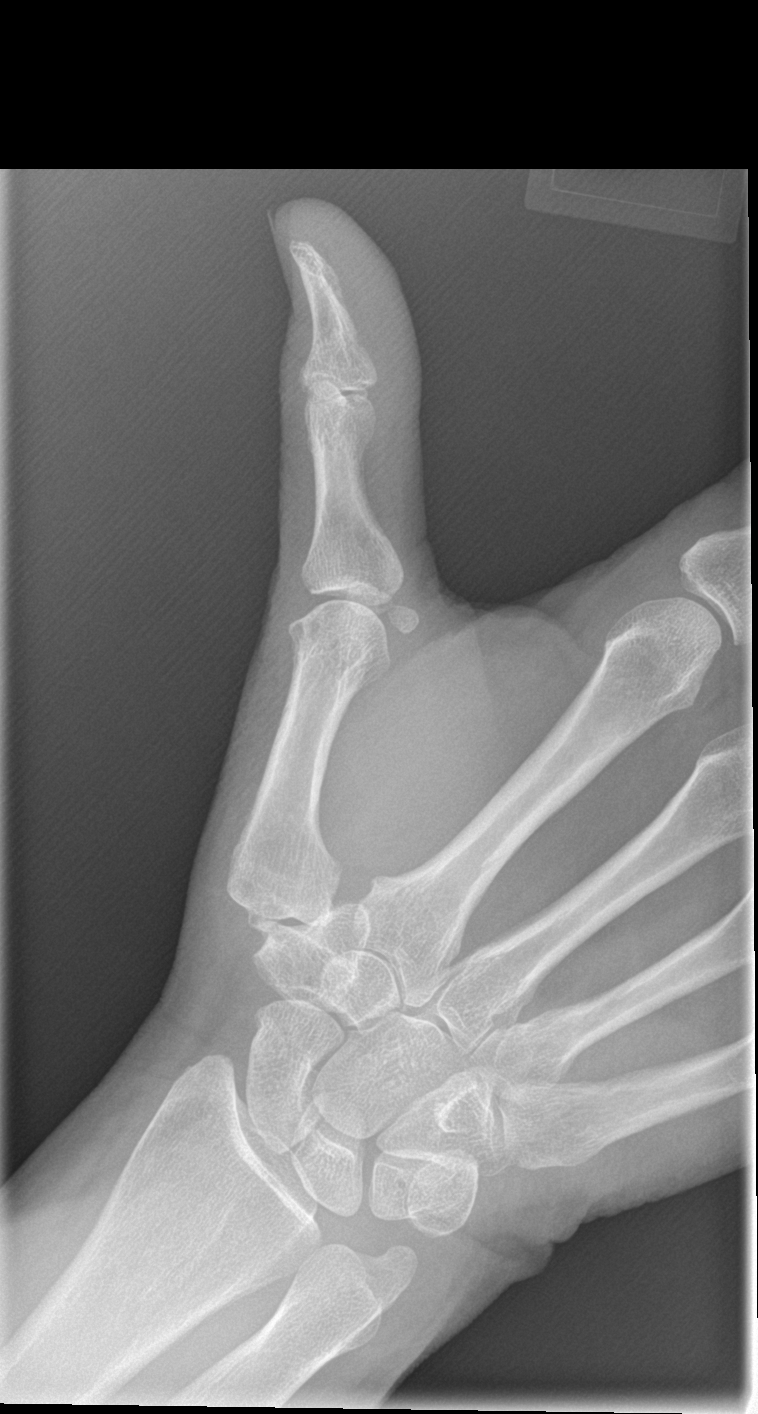

[finger obl]
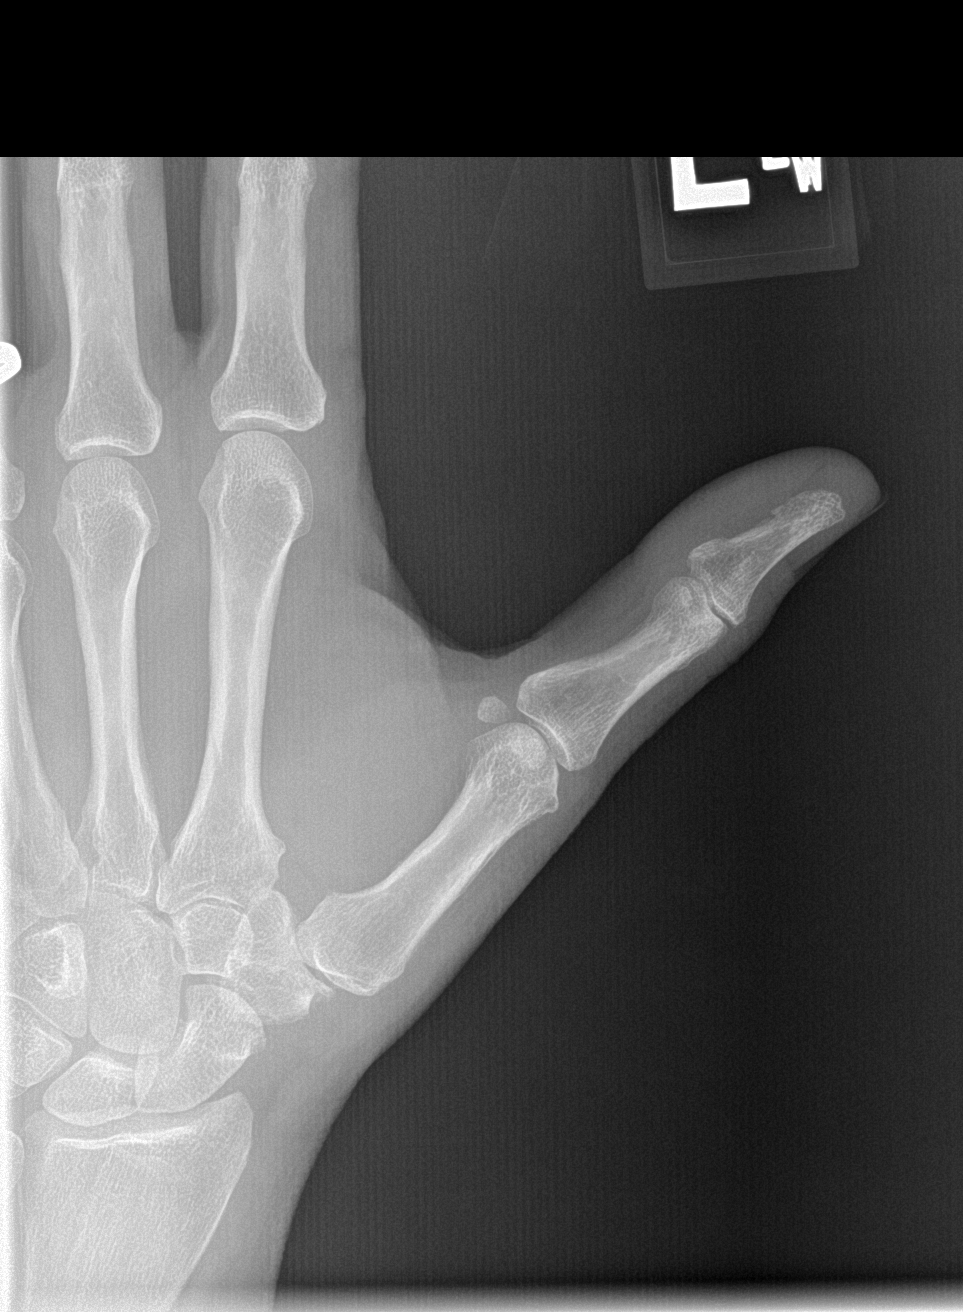

[finger lat]
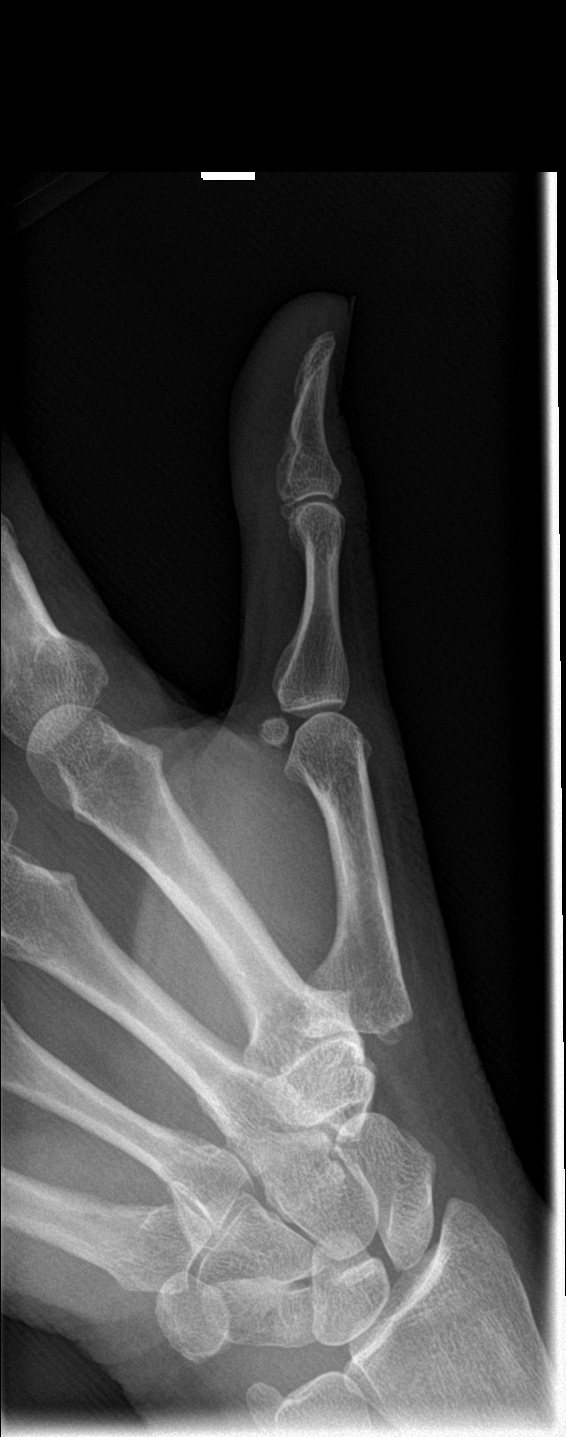

[3 of 3 positions shown; findings below may reference images not displayed]

FINDINGS: The bones are adequately mineralized. There is mild narrowing of the
first CMC joint space with a small osteophyte arising from the
periphery of the trapezium. The first metacarpal shaft is intact as
are the shafts of the phalanges. The first metacarpophalangeal and
the interphalangeal joint are unremarkable. The soft tissues are
normal.
IMPRESSION: There is mild degenerative change of the first CMC joint. There is
no acute fracture nor dislocation.

## 2015-08-30 IMAGING — CR DG WRIST COMPLETE 3+V*L*
4 series · 4 of 4 positions shown · non-contrast
Comparison: [DATE] thumb radiograph

CLINICAL DATA: 54-year-old female with left wrist pain for 1 month.

EXAM:
LEFT WRIST - COMPLETE 3+ VIEW

[wrist pa]
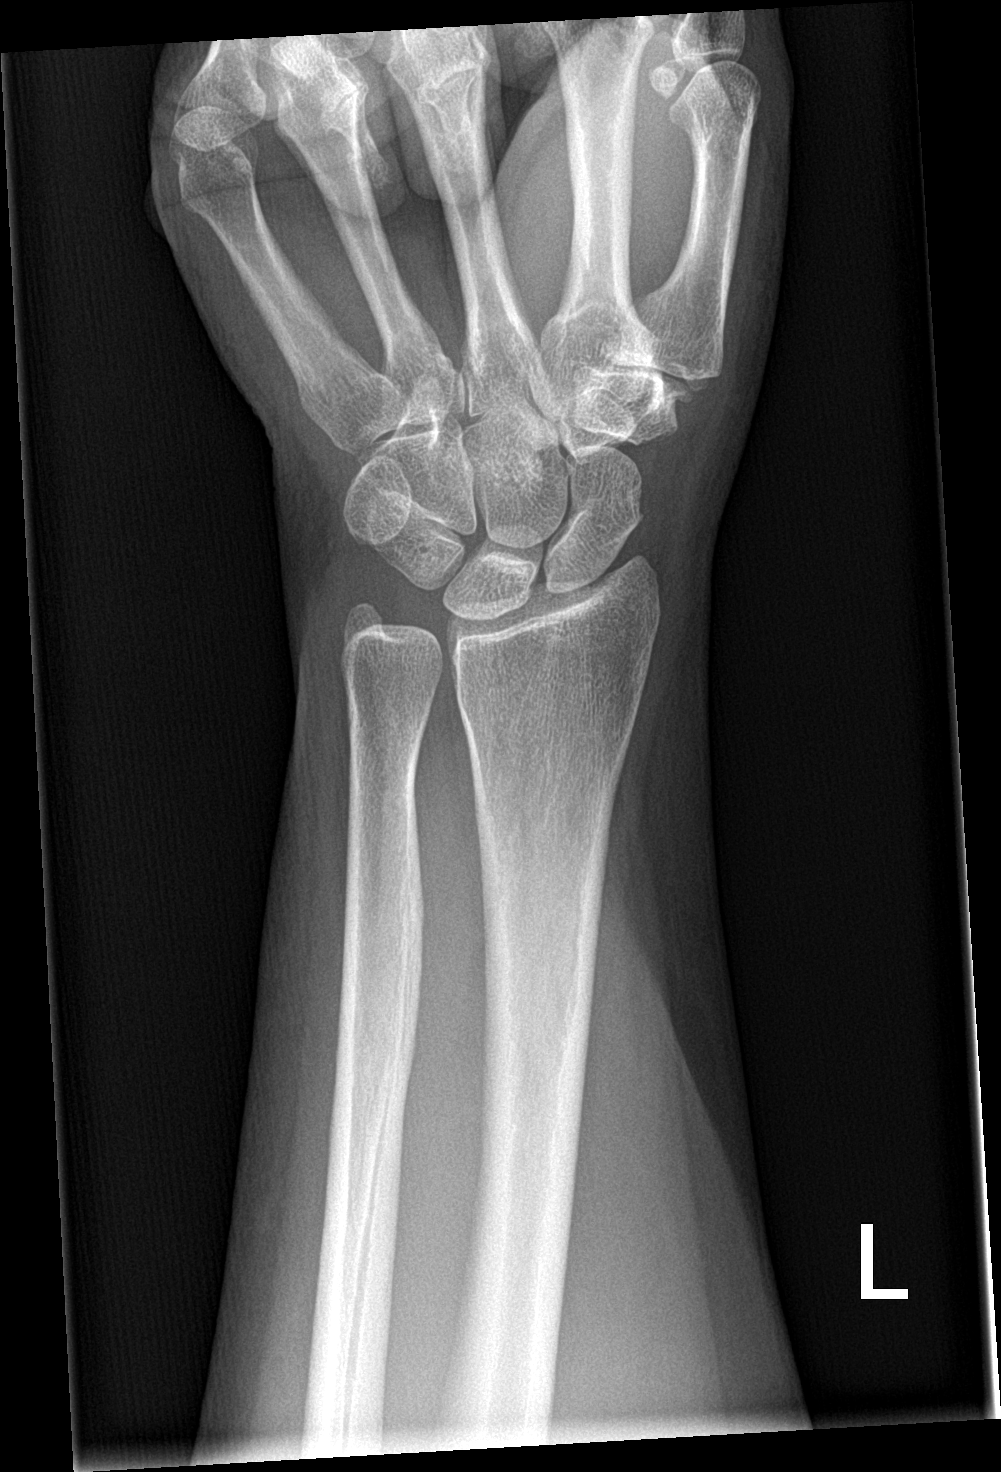

[wrist obl]
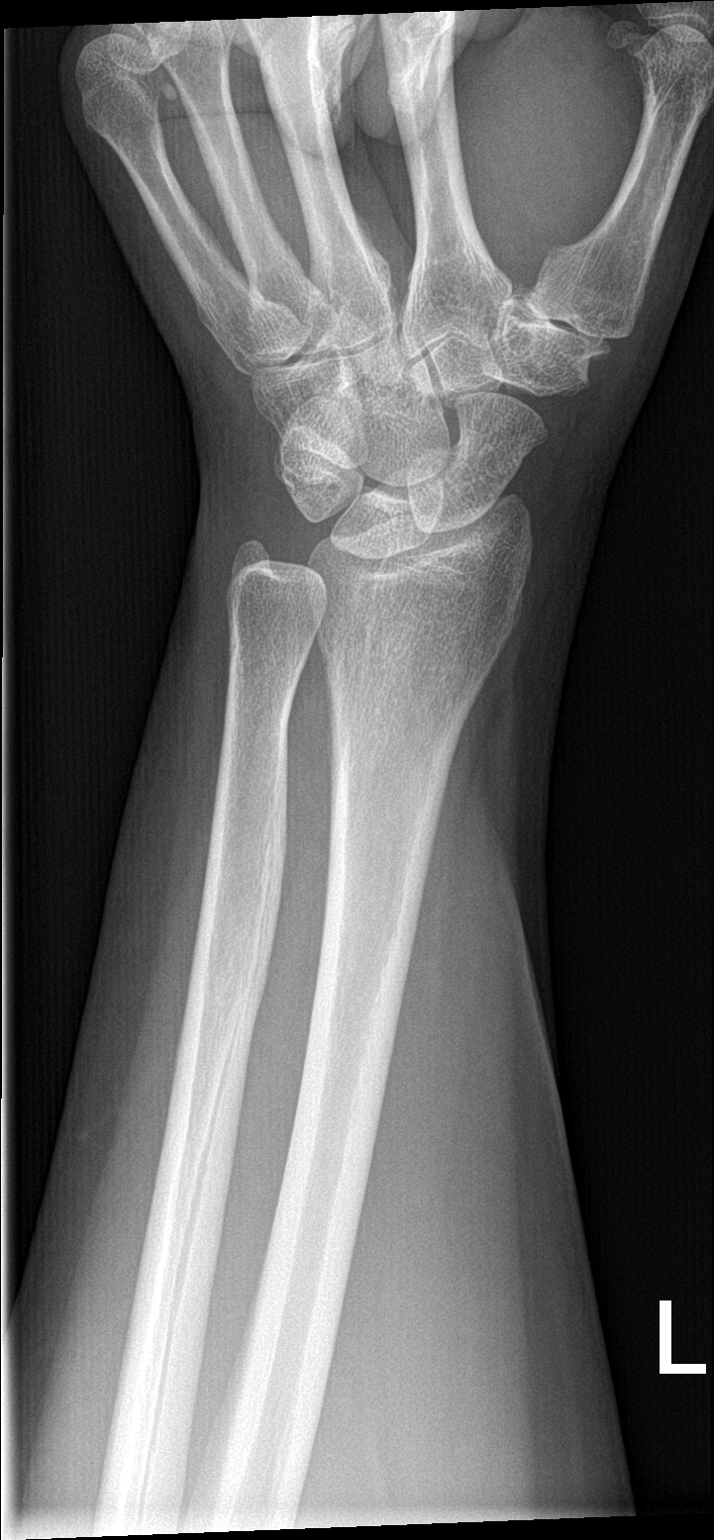

[wrist lat]
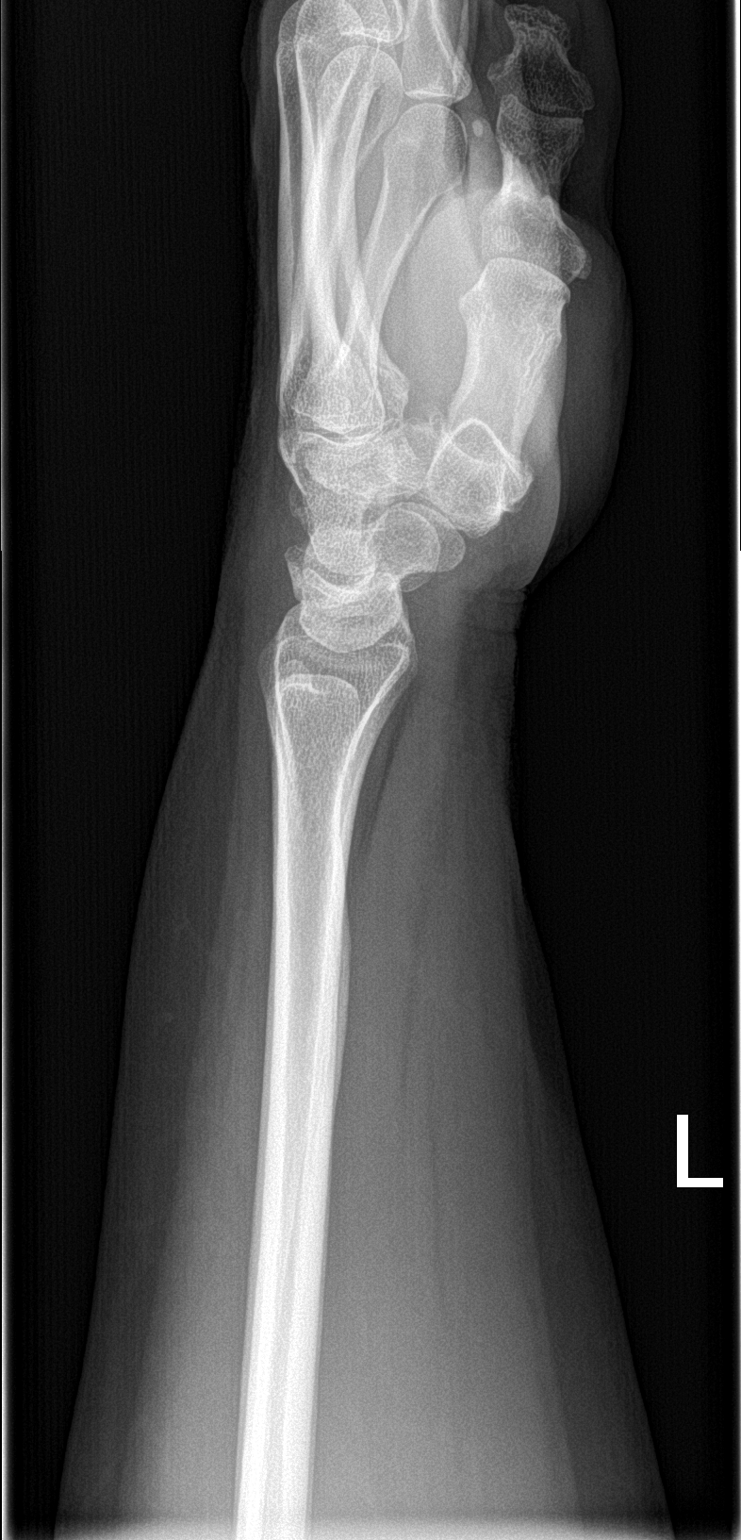

[wrist navicular]
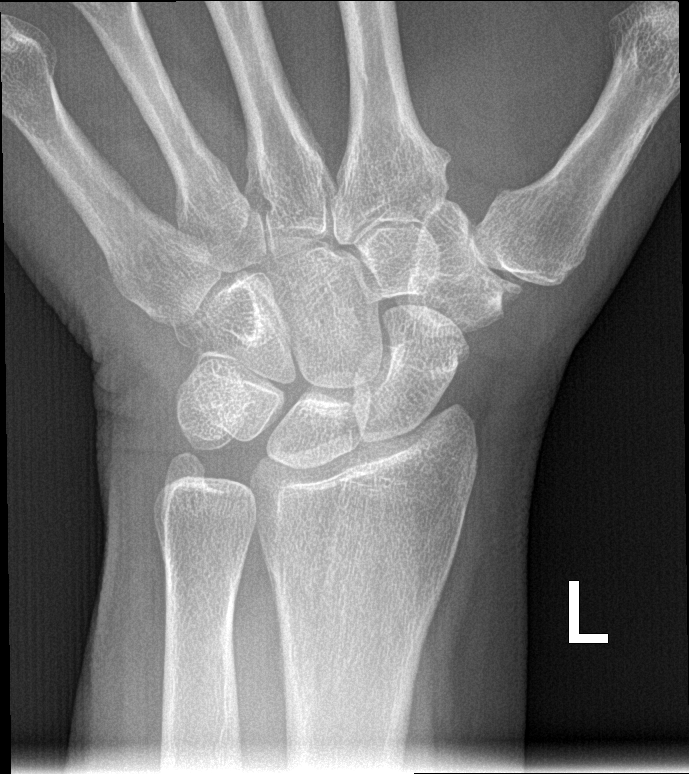

[4 of 4 positions shown; findings below may reference images not displayed]

FINDINGS: There is no evidence of acute fracture, subluxation or dislocation.

Mild degenerative changes the first carpometacarpal joint noted.

No focal bony lesions are present.
IMPRESSION: No evidence of acute abnormality.

Mild degenerative changes at the first carpometacarpal joint again
noted.

## 2015-08-30 MED ORDER — LANSOPRAZOLE 30 MG PO CPDR: 30.0000 mg | DELAYED_RELEASE_CAPSULE | Freq: Every day | ORAL | Status: DC

## 2015-08-30 NOTE — Progress Notes (Signed)
   Subjective:    Patient ID: Autumn Garcia, female    DOB: 06/21/1961, 54 y.o.   MRN: [id number]  HPI ABominal Pain  X 1 months. C/o nausea that goes away after eating, pain worse at night, discomfort with pressure right under xyphoid process.  She's not actually taking medication for her symptoms.  No fever. Burping. But no reflux.  Normal BMs.  Hx of GB removal.  Feels like a pressure in the epigastri area.  No SOB or CP.    Left thumb/wrist xray - she jammed her thumb about 3 weeks ago.   She bought an over-the-counter wrist brace initially but it didn't seem to help. Says she ordered what is fairly similar to a ok-u splint. She's been wearing that for aout the lat 5 days and that has provided more comfort. She said s extremely tender  At the [place] joint. It's paiful even just ove her thumb around. She denies any selling or redness. She's not really taking any pain relievers.     she's also due for her bilateral screening mammogram and would like to have that order placed. She would prefer to have the 3-D done since she does have more dense breast tissue.   We also had ordered an exercise tolerance test.she said she went she went for the appointment they did no allow hr spouse or her dog back in the room period that she would prefer to have an appointment made elsewhere.  Review of Systems     Objective:   Physical Exam  Constitutional: She is oriented to person, place, and time. She appears well-developed and well-nourished.  HENT:  Head: Normocephalic and atraumatic.  Cardiovascular: Normal rate, regular rhythm and normal heart sounds.   Pulmonary/Chest: Effort normal and breath sounds normal.  Abdominal: Soft. Bowel sounds are normal. She exhibits no distension and no mass. There is tenderness. There is no rebound and no guarding.  Mid- epigastric tenderness  Neurological: She is alert and oriented to person, place, and time.  Skin: Skin is warm and dry.  Psychiatric: She  has a normal mood and affect. Her behavior is normal.          Assessment & Plan:   epigastric pain-most consistent with gastritis versus gastric ulcer. We'll start her on a PPI. H. Pylori breath test performed today. We'll call with results nce available. If she's not getting relief from the pPI within 5-7 days the pease give [place] a call back and we'll consider further workup and valuation.   left thumb /wrist pain- we will get an x-ray today to evaluate for possible fractureince he did jam her thumb at the nitial injury. If it's negative then coninueto wear the cock-up splint which does seem to be providig some pain relief for her.  Mammogram ordered.    ETT re-ordered.  Will schedule with [facility].

## 2015-08-31 LAB — H. PYLORI BREATH TEST: H. pylori Breath Test: NOT DETECTED

## 2015-09-07 NOTE — Progress Notes (Signed)
HPI: 54 year old female for evaluation of epigastric pain, fatigue and family history of coronary disease. Carotid Dopplers January 2015 normal. PatientHas chronic fatigue but worse over the past 3 months. She notes increased dyspnea on exertion but no orthopnea, PND or pedal edema. No syncope. She has occasional chest pain and epigastric pain that is not exertional. It does not radiate. Can last up to one hour and resolved spontaneously. She was scheduled for an exercise treadmill but was unable to proceed as she has a panic disorder and was told her husband and dog could not be with her.  Current Outpatient Prescriptions  Medication Sig Dispense Refill  . cholecalciferol (VITAMIN D) 1000 UNITS tablet Take 2,000 Units by mouth daily.    . diazepam (VALIUM) 2 MG tablet Take 1 tablet (2 mg total) by mouth every 8 (eight) hours as needed for anxiety. 90 tablet 1  . EPINEPHrine 0.3 mg/0.3 mL IJ SOAJ injection Inject 0.3 mLs (0.3 mg total) into the muscle once. 1 Device prn  . L-Lysine 500 MG TABS Take 500 mg by mouth.    . lansoprazole (PREVACID) 30 MG capsule Take 1 capsule (30 mg total) by mouth daily at 12 noon. 30 capsule 3  . Melatonin 5 MG TABS Take 1 tablet (5 mg total) by mouth daily.  0  . Multiple Vitamins-Minerals (MULTIVITAMIN WITH MINERALS) tablet Take 1 tablet by mouth daily.    Marland Kitchen triamcinolone cream (KENALOG) 0.5 % Apply 1 application topically 2 (two) times daily. To affected areas. 80 g 5   No current facility-administered medications for this visit.    Allergies  Allergen Reactions  . Demerol [Meperidine] Nausea And Vomiting, Nausea Only and Other (See Comments)    seizures  . Latex Rash  . Nickel     infections  . Ciprofloxacin     REACTION: sucidal  . Citrus     Geographic tongue  . Codeine     REACTION: Halluncination  . Erythromycin     REACTION: Vomiting  . Lactose Intolerance (Gi)     GI upset  . Lidocaine Other (See Comments)    Pt unsure if allergic.  Last reaction at the dentist and had a seizure, but could have been injected into a vein  . Meperidine Hcl     REACTION: Muscle weakness, vomiting  . Peanut Oil     Other reaction(s): Other (See Comments) ORAL ULCERS  . Soy Allergy     Other reaction(s): Other (See Comments) MOUTH ULCER     Past Medical History  Diagnosis Date  . Anxiety   . Depression   . PTSD (post-traumatic stress disorder)   . Chronic kidney disease   . Skin cancer of forehead     If could be a more serious cancer and will not know until after surgery  . HSV infection   . TB (tuberculosis)     Tested positive and treated  . Epilepsy ([place])   . Kidney stones   . Murmur     Past Surgical History  Procedure Laterality Date  . Cholecystectomy    . Kidney surgery    . Removal of basil cell carcinoma    . Laparoscopic nephrectomy  2010.      Dr. Angeline Slim  at [facility] for large stone  . Left knee surgery    . Cervical polypectomy      Social History   Social History  . Marital Status: Married    Spouse Name: [name]  .  Number of Children: 0  . Years of Education: N/A   Occupational History  . disability    Social History Main Topics  . Smoking status: Never Smoker   . Smokeless tobacco: Never Used  . Alcohol Use: 0.0 oz/week    0 Standard drinks or equivalent per week     Comment: Occasional wine  . Drug Use: No  . Sexual Activity:    Partners: Male   Other Topics Concern  . Not on file   Social History Narrative   1 caffeinated drink per day. Does yoga for  60 minutes 3 days per week.    Family History  Problem Relation Age of Onset  . Bipolar disorder Mother   . Heart attack Mother     MI in her late 61s  . Bipolar disorder Sister   . Cancer Sister     skin  . Dementia Brother   . Hypertension Brother   . Cancer Sister     breast,brain,cervical ovarian  . Cancer Maternal Aunt     breast  . Cancer Cousin     breast  . Anemia Neg Hx   . Arrhythmia Neg Hx   . Asthma Neg Hx   .  Clotting disorder Neg Hx   . Fainting Neg Hx   . Heart disease Neg Hx   . Heart failure Neg Hx   . Hyperlipidemia Neg Hx     ROS: Fatigue, numbness but no fevers or chills, productive cough, hemoptysis, dysphasia, odynophagia, melena, hematochezia, dysuria, hematuria, rash, seizure activity, orthopnea, PND, pedal edema, claudication. Remaining systems are negative.  Physical Exam:   Blood pressure 108/70, pulse 60, height 5\' 4"  (1.626 m), weight 155 lb 6.4 oz (70.489 kg).  General:  Well developed/well nourished in NAD Skin warm/dry Patient not depressed No peripheral clubbing Back-normal HEENT-normal/normal eyelids Neck supple/normal carotid upstroke bilaterally; no bruits; no JVD; no thyromegaly chest - CTA/ normal expansion CV - RRR/normal S1 and S2; no murmurs, rubs or gallops;  PMI nondisplaced Abdomen -NT/ND, no HSM, no mass, + bowel sounds, no bruit 2+ femoral pulses, no bruits Ext-no edema, chords, 2+ DP Neuro-grossly nonfocal  ECG NSR, No ST changes.

## 2015-09-08 ENCOUNTER — Ambulatory Visit (INDEPENDENT_AMBULATORY_CARE_PROVIDER_SITE_OTHER): Payer: Medicare Other | Admitting: Cardiology

## 2015-09-08 ENCOUNTER — Encounter: Payer: Self-pay | Admitting: Cardiology

## 2015-09-08 VITALS — BP 108/70 | HR 60 | Ht 64.0 in | Wt 155.4 lb

## 2015-09-08 DIAGNOSIS — R0600 Dyspnea, unspecified: Secondary | ICD-10-CM | POA: Diagnosis not present

## 2015-09-08 DIAGNOSIS — R072 Precordial pain: Secondary | ICD-10-CM | POA: Diagnosis not present

## 2015-09-08 DIAGNOSIS — R079 Chest pain, unspecified: Secondary | ICD-10-CM | POA: Insufficient documentation

## 2015-09-08 NOTE — Assessment & Plan Note (Signed)
Patient symptoms are atypical. We will arrange an exercise treadmill for risk stratification.

## 2015-09-08 NOTE — Assessment & Plan Note (Addendum)
Patient complains of dyspnea and fatigue. Plan echocardiogram to evaluate LV function.

## 2015-09-08 NOTE — Patient Instructions (Addendum)
Your physician has requested that you have an exercise tolerance test. For further information please visit [email or web address]. Please also follow instruction sheet, as given.  Your physician has requested that you have an echocardiogram. Echocardiography is a painless test that uses sound waves to create images of your heart. It provides your doctor with information about the size and shape of your heart and how well your heart's chambers and valves are working. This procedure takes approximately one hour. There are no restrictions for this procedure.  Dr Stanford Breed recommends that you follow-up with him as needed.  Exercise Stress Electrocardiogram An exercise stress electrocardiogram is a test to check how blood flows to your heart. It is done to find areas of poor blood flow. You will need to walk on a treadmill for this test. The electrocardiogram will record your heartbeat when you are at rest and when you are exercising. BEFORE THE PROCEDURE  Do not have drinks with caffeine or foods with caffeine for 24 hours before the test, or as told by your doctor. This includes coffee, tea (even decaf tea), sodas, chocolate, and cocoa.  Follow your doctor's instructions about eating and drinking before the test.  Ask your doctor what medicines you should or should not take before the test. Take your medicines with water unless told by your doctor not to.  If you use an inhaler, bring it with you to the test.  Bring a snack to eat after the test.  Do not  smoke for 4 hours before the test.  Do not put lotions, powders, creams, or oils on your chest before the test.  Wear comfortable shoes and clothing. PROCEDURE  You will have patches put on your chest. Small areas of your chest may need to be shaved. Wires will be connected to the patches.  Your heart rate will be watched while you are resting and while you are exercising.  You will walk on the treadmill. The treadmill will slowly get  faster to raise your heart rate.  The test will take about 1-2 hours. AFTER THE PROCEDURE  Your heart rate and blood pressure will be watched after the test.  You may return to your normal diet, activities, and medicines or as told by your doctor.   This information is not intended to replace advice given to you by your health care provider. Make sure you discuss any questions you have with your health care provider.   Document Released: 03/06/2008 Document Revised: 10/09/2014 Document Reviewed: 05/26/2013 Elsevier Interactive Patient Education [employer].

## 2015-09-09 ENCOUNTER — Telehealth (HOSPITAL_COMMUNITY): Payer: Self-pay | Admitting: *Deleted

## 2015-09-09 NOTE — Telephone Encounter (Signed)
Encounter complete. 

## 2015-10-05 ENCOUNTER — Ambulatory Visit (INDEPENDENT_AMBULATORY_CARE_PROVIDER_SITE_OTHER): Payer: Medicare Other

## 2015-10-05 ENCOUNTER — Other Ambulatory Visit: Payer: Self-pay

## 2015-10-05 ENCOUNTER — Ambulatory Visit (HOSPITAL_COMMUNITY): Payer: Medicare Other | Attending: Cardiovascular Disease

## 2015-10-05 ENCOUNTER — Encounter: Payer: Medicare Other | Admitting: Physician Assistant

## 2015-10-05 DIAGNOSIS — R0600 Dyspnea, unspecified: Secondary | ICD-10-CM

## 2015-10-05 LAB — EXERCISE TOLERANCE TEST
Estimated workload: 10.1 METS
Exercise duration (min): 8 min
Exercise duration (sec): 0 s
MPHR: 165 {beats}/min
Peak HR: 162 {beats}/min
Percent HR: 98 %
RPE: 18
Rest HR: 60 {beats}/min

## 2015-10-11 ENCOUNTER — Ambulatory Visit: Payer: Self-pay | Admitting: Family Medicine

## 2015-10-27 DIAGNOSIS — F4310 Post-traumatic stress disorder, unspecified: Secondary | ICD-10-CM | POA: Diagnosis not present

## 2015-11-04 DIAGNOSIS — F4310 Post-traumatic stress disorder, unspecified: Secondary | ICD-10-CM | POA: Diagnosis not present

## 2015-11-08 DIAGNOSIS — F4310 Post-traumatic stress disorder, unspecified: Secondary | ICD-10-CM | POA: Diagnosis not present

## 2015-11-15 DIAGNOSIS — F4310 Post-traumatic stress disorder, unspecified: Secondary | ICD-10-CM | POA: Diagnosis not present

## 2015-11-29 DIAGNOSIS — F4310 Post-traumatic stress disorder, unspecified: Secondary | ICD-10-CM | POA: Diagnosis not present

## 2015-12-06 DIAGNOSIS — F4310 Post-traumatic stress disorder, unspecified: Secondary | ICD-10-CM | POA: Diagnosis not present

## 2015-12-20 DIAGNOSIS — F4310 Post-traumatic stress disorder, unspecified: Secondary | ICD-10-CM | POA: Diagnosis not present

## 2016-01-03 DIAGNOSIS — F4310 Post-traumatic stress disorder, unspecified: Secondary | ICD-10-CM | POA: Diagnosis not present

## 2016-01-10 DIAGNOSIS — F4310 Post-traumatic stress disorder, unspecified: Secondary | ICD-10-CM | POA: Diagnosis not present

## 2016-01-17 DIAGNOSIS — F4310 Post-traumatic stress disorder, unspecified: Secondary | ICD-10-CM | POA: Diagnosis not present

## 2016-01-24 DIAGNOSIS — F4310 Post-traumatic stress disorder, unspecified: Secondary | ICD-10-CM | POA: Diagnosis not present

## 2016-01-31 DIAGNOSIS — F4310 Post-traumatic stress disorder, unspecified: Secondary | ICD-10-CM | POA: Diagnosis not present

## 2016-02-07 DIAGNOSIS — F4310 Post-traumatic stress disorder, unspecified: Secondary | ICD-10-CM | POA: Diagnosis not present

## 2016-02-21 DIAGNOSIS — F4310 Post-traumatic stress disorder, unspecified: Secondary | ICD-10-CM | POA: Diagnosis not present

## 2016-03-06 DIAGNOSIS — F4310 Post-traumatic stress disorder, unspecified: Secondary | ICD-10-CM | POA: Diagnosis not present

## 2016-03-13 DIAGNOSIS — F4310 Post-traumatic stress disorder, unspecified: Secondary | ICD-10-CM | POA: Diagnosis not present

## 2016-03-14 ENCOUNTER — Telehealth: Payer: Self-pay

## 2016-03-14 MED ORDER — VALACYCLOVIR HCL 500 MG PO TABS: ORAL_TABLET | ORAL | Status: DC

## 2016-03-14 NOTE — Telephone Encounter (Signed)
Refill sent for valtrex.

## 2016-03-14 NOTE — Telephone Encounter (Signed)
Pt.notified

## 2016-03-14 NOTE — Telephone Encounter (Signed)
Yes, okay to pick up pills or just the acute outbreak, 9 tabs. Okay to refill the acyclovir cream as well. It should be on the old med list that should be able to just click and hit refill.

## 2016-03-15 MED ORDER — ACYCLOVIR 5 % EX CREA: 1.0000 "application " | TOPICAL_CREAM | CUTANEOUS | Status: DC

## 2016-03-15 NOTE — Addendum Note (Signed)
Addended by: Beatrice Lecher D on: 03/15/2016 10:17 AM   Modules accepted: Orders

## 2016-03-15 NOTE — Telephone Encounter (Signed)
The acyclovir cream is not on her med list or listed in her history.  The only cream I see listed is Kenalog.

## 2016-03-15 NOTE — Telephone Encounter (Signed)
Pharmacy called, Pt is self pay and ointment is much cheaper. Gave verbal OK for the change.

## 2016-03-15 NOTE — Telephone Encounter (Signed)
Yes okay to fill the 9 tabs for the acute flare. Okay to send in the cream. Just check her old medication list and refill the cream.

## 2016-03-15 NOTE — Telephone Encounter (Signed)
Cream sent

## 2016-03-15 NOTE — Telephone Encounter (Signed)
It is not in her history.  Please advise.

## 2016-03-20 DIAGNOSIS — F4310 Post-traumatic stress disorder, unspecified: Secondary | ICD-10-CM | POA: Diagnosis not present

## 2016-03-27 DIAGNOSIS — F4310 Post-traumatic stress disorder, unspecified: Secondary | ICD-10-CM | POA: Diagnosis not present

## 2016-04-10 DIAGNOSIS — F4310 Post-traumatic stress disorder, unspecified: Secondary | ICD-10-CM | POA: Diagnosis not present

## 2016-04-17 DIAGNOSIS — F4310 Post-traumatic stress disorder, unspecified: Secondary | ICD-10-CM | POA: Diagnosis not present

## 2016-04-19 ENCOUNTER — Encounter: Payer: Self-pay | Admitting: Emergency Medicine

## 2016-04-19 ENCOUNTER — Emergency Department (INDEPENDENT_AMBULATORY_CARE_PROVIDER_SITE_OTHER)
Admission: EM | Admit: 2016-04-19 | Discharge: 2016-04-19 | Disposition: A | Discharge status: Home / Self Care | Payer: Medicare Other | Source: Home / Self Care | Attending: Family Medicine | Admitting: Family Medicine

## 2016-04-19 DIAGNOSIS — J029 Acute pharyngitis, unspecified: Secondary | ICD-10-CM | POA: Diagnosis not present

## 2016-04-19 DIAGNOSIS — R52 Pain, unspecified: Secondary | ICD-10-CM | POA: Diagnosis not present

## 2016-04-19 DIAGNOSIS — J028 Acute pharyngitis due to other specified organisms: Secondary | ICD-10-CM | POA: Diagnosis not present

## 2016-04-19 DIAGNOSIS — L02419 Cutaneous abscess of limb, unspecified: Secondary | ICD-10-CM

## 2016-04-19 LAB — POCT RAPID STREP A (OFFICE): Rapid Strep A Screen: NEGATIVE

## 2016-04-19 MED ORDER — AMOXICILLIN-POT CLAVULANATE 875-125 MG PO TABS: 1.0000 | ORAL_TABLET | Freq: Two times a day (BID) | ORAL | Status: DC

## 2016-04-19 NOTE — Discharge Instructions (Signed)
°  Please take antibiotics as prescribed and be sure to complete entire course even if you start to feel better to ensure infection does not come back. ° °

## 2016-04-19 NOTE — ED Provider Notes (Signed)
CSN: [id number]     Arrival date & time 04/19/16  1436 History   First MD Initiated Contact with Patient 04/19/16 1500     Chief Complaint  Patient presents with  . Sore Throat    Sore throat  . Abscess    Abscess   (Consider location/radiation/quality/duration/timing/severity/associated sxs/prior Treatment) HPI  Autumn Garcia is a 55 y.o. female presenting to UC with c/o 1 day of sore throat, worse with swallowing and 3 days of gradually worsening pain, redness, and swelling in Right axilla.  She has used a warm compress and admits to "popping" on of the smaller areas under her arm, expressed small amount of pus but notes another "bump" showed up.  She also c/o generalized fatigue and body aches.  Bump under her arm is aching, 4/10, worse with palpation. No prior hx of abscesses. No sick contacts or recent travel. She has not taken anything for pain as she has multiple medication allergies and is hesitant to take any medication unless she has to.     Past Medical History  Diagnosis Date  . Anxiety   . Depression   . PTSD (post-traumatic stress disorder)   . Chronic kidney disease   . Skin cancer of forehead     If could be a more serious cancer and will not know until after surgery  . HSV infection   . TB (tuberculosis)     Tested positive and treated  . Epilepsy ([place])   . Kidney stones   . Murmur    Past Surgical History  Procedure Laterality Date  . Cholecystectomy    . Kidney surgery    . Removal of basil cell carcinoma    . Laparoscopic nephrectomy  2010.      Dr. Angeline Slim  at [facility] for large stone  . Left knee surgery    . Cervical polypectomy     Family History  Problem Relation Age of Onset  . Bipolar disorder Mother   . Heart attack Mother     MI in her late 51s  . Bipolar disorder Sister   . Cancer Sister     skin  . Dementia Brother   . Hypertension Brother   . Cancer Sister     breast,brain,cervical ovarian  . Cancer Maternal Aunt     breast  .  Cancer Cousin     breast  . Anemia Neg Hx   . Arrhythmia Neg Hx   . Asthma Neg Hx   . Clotting disorder Neg Hx   . Fainting Neg Hx   . Heart disease Neg Hx   . Heart failure Neg Hx   . Hyperlipidemia Neg Hx    Social History  Substance Use Topics  . Smoking status: Never Smoker   . Smokeless tobacco: Never Used  . Alcohol Use: 0.0 oz/week    0 Standard drinks or equivalent per week     Comment: Occasional wine   OB History    Gravida Para Term Preterm AB TAB SAB Ectopic Multiple Living   1 0     0   0     Review of Systems  Constitutional: Positive for chills and fatigue. Negative for fever.  HENT: Positive for sore throat. Negative for congestion, ear pain, trouble swallowing and voice change.   Respiratory: Negative for cough and shortness of breath.   Cardiovascular: Negative for chest pain and palpitations.  Gastrointestinal: Negative for nausea, vomiting, abdominal pain and diarrhea.  Musculoskeletal: Positive for myalgias  and arthralgias. Negative for back pain.       Body aches  Skin: Positive for color change and wound. Negative for rash.       Abscess under Right arm  Neurological: Negative for dizziness, light-headedness and headaches.    Allergies  Demerol; Latex; Nickel; Ciprofloxacin; Citrus; Codeine; Erythromycin; Lactose intolerance (gi); Lidocaine; Meperidine hcl; Peanut oil; and Soy allergy  Home Medications   Prior to Admission medications   Medication Sig Start Date End Date Taking? Authorizing Provider  acyclovir cream (ZOVIRAX) 5 % Apply 1 application topically every 3 (three) hours. 03/15/16   Hali Marry, MD  amoxicillin-clavulanate (AUGMENTIN) 875-125 MG tablet Take 1 tablet by mouth 2 (two) times daily. One po bid x 7 days 04/19/16   Noland Fordyce, PA-C  cholecalciferol (VITAMIN D) 1000 UNITS tablet Take 2,000 Units by mouth daily.    Historical Provider, MD  diazepam (VALIUM) 2 MG tablet Take 1 tablet (2 mg total) by mouth every 8  (eight) hours as needed for anxiety. 10/07/13   Hali Marry, MD  EPINEPHrine 0.3 mg/0.3 mL IJ SOAJ injection Inject 0.3 mLs (0.3 mg total) into the muscle once. 05/14/15   Hali Marry, MD  L-Lysine 500 MG TABS Take 500 mg by mouth.    Historical Provider, MD  lansoprazole (PREVACID) 30 MG capsule Take 1 capsule (30 mg total) by mouth daily at 12 noon. 08/30/15   Hali Marry, MD  Melatonin 5 MG TABS Take 1 tablet (5 mg total) by mouth daily. 10/07/13   Hali Marry, MD  Multiple Vitamins-Minerals (MULTIVITAMIN WITH MINERALS) tablet Take 1 tablet by mouth daily.    Historical Provider, MD  triamcinolone cream (KENALOG) 0.5 % Apply 1 application topically 2 (two) times daily. To affected areas. 05/18/15   Donella Stade, PA-C  valACYclovir (VALTREX) 500 MG tablet Take 1 tablet PO BID x 3 days for flare, then 1 tablet daily for suppression 03/14/16   Hali Marry, MD   Meds Ordered and Administered this Visit  Medications - No data to display  BP 133/79 mmHg  Pulse 77  Temp(Src) 98.3 F (36.8 C) (Oral)  Ht 5\' 4"  (1.626 m)  Wt 157 lb (71.215 kg)  BMI 26.94 kg/m2  SpO2 100% No data found.   Physical Exam  Constitutional: She appears well-developed and well-nourished. No distress.  HENT:  Head: Normocephalic and atraumatic.  Right Ear: Tympanic membrane normal.  Left Ear: Tympanic membrane normal.  Nose: Nose normal.  Mouth/Throat: Uvula is midline and mucous membranes are normal. Posterior oropharyngeal erythema present. No oropharyngeal exudate, posterior oropharyngeal edema or tonsillar abscesses.  Eyes: Conjunctivae are normal. No scleral icterus.  Neck: Normal range of motion. Neck supple.  Cardiovascular: Normal rate, regular rhythm and normal heart sounds.   Pulmonary/Chest: Effort normal and breath sounds normal. No stridor. No respiratory distress. She has no wheezes. She has no rales.  Abdominal: Soft. She exhibits no distension. There is no  tenderness.  Musculoskeletal: Normal range of motion.  Lymphadenopathy:    She has no cervical adenopathy.  Neurological: She is alert.  Skin: Skin is warm and dry. She is not diaphoretic. There is erythema.  Right axilla: 1cm area of induration, mild erythema with tenderness. 40mm pustule in center of induration. No bleeding or discharge. No red streaking.   Nursing note and vitals reviewed.   ED Course  Procedures (including critical care time)  Labs Review Labs Reviewed  POCT RAPID STREP A (OFFICE)  Imaging Review No results found.    MDM   1. Acute pharyngitis due to other specified organisms   2. Axillary abscess   3. Pharyngitis   4. Body aches    Pt c/o body aches, fatigue, sore throat and abscess under Right arm.  No hx of abscesses. Pt requesting to try antibiotic treatments first as she does not want to have abscess cut open unless she has to.  Abscess is small at this time w/o palpable fluctuance. Will try antibiotics and encourage warm compresses.  Rapid strep: negative  Rx: Augmentin (pt has had amoxicillin before w/o reaction)   May have acetaminophen for pain and fever. F/u with PCP in 5-7 days for recheck of symptoms if not improving. Patient verbalized understanding and agreement with treatment plan.     Noland Fordyce, PA-C 04/19/16 1535

## 2016-04-19 NOTE — ED Notes (Signed)
Sore throat x 1 day Right axilla abscess x 3 days

## 2016-04-21 ENCOUNTER — Telehealth: Payer: Self-pay

## 2016-04-21 NOTE — Telephone Encounter (Signed)
Pt feeling much better, will call PCP or UC if questions or problems.

## 2016-04-24 DIAGNOSIS — F4310 Post-traumatic stress disorder, unspecified: Secondary | ICD-10-CM | POA: Diagnosis not present

## 2016-05-01 DIAGNOSIS — F4310 Post-traumatic stress disorder, unspecified: Secondary | ICD-10-CM | POA: Diagnosis not present

## 2016-05-08 ENCOUNTER — Encounter: Payer: Self-pay | Admitting: Family Medicine

## 2016-05-08 ENCOUNTER — Ambulatory Visit (INDEPENDENT_AMBULATORY_CARE_PROVIDER_SITE_OTHER): Payer: Medicare Other | Admitting: Family Medicine

## 2016-05-08 VITALS — BP 119/82 | HR 78 | Wt 159.0 lb

## 2016-05-08 DIAGNOSIS — F411 Generalized anxiety disorder: Secondary | ICD-10-CM

## 2016-05-08 DIAGNOSIS — L923 Foreign body granuloma of the skin and subcutaneous tissue: Secondary | ICD-10-CM | POA: Diagnosis not present

## 2016-05-08 MED ORDER — DIAZEPAM 2 MG PO TABS: 2.0000 mg | ORAL_TABLET | Freq: Three times a day (TID) | ORAL | 1 refills | Status: DC | PRN

## 2016-05-08 MED ORDER — MUPIROCIN 2 % EX OINT: TOPICAL_OINTMENT | CUTANEOUS | 3 refills | Status: DC

## 2016-05-08 NOTE — Patient Instructions (Signed)
Thank you for coming in today. Keep the wound covered with ointment.  Return sooner if needed.   Follow up with your doctor soon.

## 2016-05-08 NOTE — Progress Notes (Signed)
Autumn Garcia is a 55 y.o. female who presents to [facility] today for evaluation of right eye swelling.  Patient has been undergoing heat therapy treatment for removal of a tattoo just lateral to her right eye.  Her most recent treatment was 3 days ago.  Patient claims it was a new type of treatment involving "hot needles and cream" but claims the needles didn't penetrate the skin.  Since then she has experience redness, swelling, and pain over her tattoo.  Her symptoms improved over the weekend until this morning when she woke up with right eye swelling.  She denies vision changes, fever, and headache.  No other complaints.  Additionally she would like a refill of her Valium. She typically sees her PCP for this issue. She uses 1 or 2 pills a week and has been a long time since her last refill. She notes that it helped significantly. She denies severe anxiety symptoms.    Past Medical History:  Diagnosis Date  . Anxiety   . Chronic kidney disease   . Depression   . Epilepsy ([place])   . HSV infection   . Kidney stones   . Murmur   . PTSD (post-traumatic stress disorder)   . Skin cancer of forehead    If could be a more serious cancer and will not know until after surgery  . TB (tuberculosis)    Tested positive and treated   Past Surgical History:  Procedure Laterality Date  . CERVICAL POLYPECTOMY    . CHOLECYSTECTOMY    . KIDNEY SURGERY    . LAPAROSCOPIC NEPHRECTOMY  2010.     Dr. Angeline Slim  at [facility] for large stone  . left knee surgery    . Removal of basil cell carcinoma     Social History  Substance Use Topics  . Smoking status: Never Smoker  . Smokeless tobacco: Never Used  . Alcohol use 0.0 oz/week     Comment: Occasional wine   family history includes Bipolar disorder in her mother and sister; Cancer in her cousin, maternal aunt, sister, and  sister; Dementia in her brother; Heart attack in her mother; Hypertension in her brother.  ROS as above:  Medications: Current Outpatient Prescriptions  Medication Sig Dispense Refill  . cholecalciferol (VITAMIN D) 1000 UNITS tablet Take 2,000 Units by mouth daily.    . diazepam (VALIUM) 2 MG tablet Take 1 tablet (2 mg total) by mouth every 8 (eight) hours as needed for anxiety. 90 tablet 1  . L-Lysine 500 MG TABS Take 500 mg by mouth.    . lansoprazole (PREVACID) 30 MG capsule Take 1 capsule (30 mg total) by mouth daily at 12 noon. 30 capsule 3  . Multiple Vitamins-Minerals (MULTIVITAMIN WITH MINERALS) tablet Take 1 tablet by mouth daily.    Marland Kitchen EPINEPHrine 0.3 mg/0.3 mL IJ SOAJ injection Inject 0.3 mLs (0.3 mg total) into the muscle once. (Patient not taking: Reported on 05/08/2016) 1 Device prn  . Melatonin 5 MG TABS Take 1 tablet (5 mg total) by mouth daily. (Patient not taking: Reported on 05/08/2016)  0  . mupirocin ointment (BACTROBAN) 2 % Apply to affected area TID for 7 days. 30 g 3   No current facility-administered medications for this visit.    Allergies  Allergen Reactions  . Demerol [Meperidine] Nausea And Vomiting, Nausea Only and Other (See Comments)    seizures  . Latex Rash  . Nickel  infections  . Ciprofloxacin     REACTION: sucidal  . Citrus     Geographic tongue  . Codeine     REACTION: Halluncination  . Erythromycin     REACTION: Vomiting  . Lactose Intolerance (Gi)     GI upset  . Lidocaine Other (See Comments)    Pt unsure if allergic. Last reaction at the dentist and had a seizure, but could have been injected into a vein  . Meperidine Hcl     REACTION: Muscle weakness, vomiting  . Peanut Oil     Other reaction(s): Other (See Comments) ORAL ULCERS  . Soy Allergy     Other reaction(s): Other (See Comments) MOUTH ULCER     Exam:  BP 119/82   Pulse 78   Wt 159 lb (72.1 kg)   BMI 27.29 kg/m  Gen: Well NAD, nontoxic appearing HEENT: EOMI,  MMM,  pupils equal round and reactive to light.  Mild swelling under her right eye Skin: Multiple 2 cm in diameter areas of erythema with overlying scabbing.  The area is clean, dry, and without pus or signs of cellulitis.    No results found for this or any previous visit (from the past 24 hour(s)). No results found.    Assessment and Plan: 55 y.o. female with right eye swelling, likely secondary to irritation from the procedure.  No signs of cellulitis or infection on exam.  No vision changes or abnormalities on eye exam - Mupirocin cream Recheck if not improving  Refill Valium  No orders of the defined types were placed in this encounter.   Discussed warning signs or symptoms. Please see discharge instructions. Patient expresses understanding.

## 2016-05-15 DIAGNOSIS — F4310 Post-traumatic stress disorder, unspecified: Secondary | ICD-10-CM | POA: Diagnosis not present

## 2016-05-29 DIAGNOSIS — F4310 Post-traumatic stress disorder, unspecified: Secondary | ICD-10-CM | POA: Diagnosis not present

## 2016-05-31 DIAGNOSIS — F4310 Post-traumatic stress disorder, unspecified: Secondary | ICD-10-CM | POA: Diagnosis not present

## 2016-06-12 DIAGNOSIS — F4310 Post-traumatic stress disorder, unspecified: Secondary | ICD-10-CM | POA: Diagnosis not present

## 2016-06-26 DIAGNOSIS — F4310 Post-traumatic stress disorder, unspecified: Secondary | ICD-10-CM | POA: Diagnosis not present

## 2016-06-29 ENCOUNTER — Emergency Department (INDEPENDENT_AMBULATORY_CARE_PROVIDER_SITE_OTHER)
Admission: EM | Admit: 2016-06-29 | Discharge: 2016-06-29 | Disposition: A | Discharge status: Home / Self Care | Payer: Medicare Other | Source: Home / Self Care

## 2016-06-29 ENCOUNTER — Emergency Department (INDEPENDENT_AMBULATORY_CARE_PROVIDER_SITE_OTHER): Payer: Medicare Other

## 2016-06-29 ENCOUNTER — Encounter: Payer: Self-pay | Admitting: Emergency Medicine

## 2016-06-29 DIAGNOSIS — S92501A Displaced unspecified fracture of right lesser toe(s), initial encounter for closed fracture: Secondary | ICD-10-CM | POA: Diagnosis not present

## 2016-06-29 DIAGNOSIS — X58XXXA Exposure to other specified factors, initial encounter: Secondary | ICD-10-CM | POA: Diagnosis not present

## 2016-06-29 DIAGNOSIS — S99921A Unspecified injury of right foot, initial encounter: Secondary | ICD-10-CM

## 2016-06-29 MED ORDER — IBUPROFEN 400 MG PO TABS: ORAL_TABLET | ORAL | 0 refills | Status: DC

## 2016-06-29 NOTE — ED Triage Notes (Signed)
Patient hurt right foot 2 days ago; thought it was stable then re-injured today. No OTCs.

## 2016-07-10 DIAGNOSIS — F4310 Post-traumatic stress disorder, unspecified: Secondary | ICD-10-CM | POA: Diagnosis not present

## 2016-07-15 ENCOUNTER — Emergency Department (INDEPENDENT_AMBULATORY_CARE_PROVIDER_SITE_OTHER)
Admission: EM | Admit: 2016-07-15 | Discharge: 2016-07-15 | Disposition: A | Discharge status: Home / Self Care | Payer: Medicare Other | Source: Home / Self Care | Attending: Family Medicine | Admitting: Family Medicine

## 2016-07-15 DIAGNOSIS — B029 Zoster without complications: Secondary | ICD-10-CM

## 2016-07-15 MED ORDER — ACYCLOVIR 800 MG PO TABS
800.0000 mg | ORAL_TABLET | Freq: Every day | ORAL | 0 refills | Status: DC
Start: 2016-07-15 — End: 2017-01-03

## 2016-07-15 NOTE — ED Provider Notes (Signed)
[facility]    CSN: [id number] Arrival date & time: 07/15/16  1246     History   Chief Complaint Chief Complaint  Patient presents with  . Herpes Zoster    HPI Autumn Garcia is a 55 y.o. female.   Patient reports onset last night of recurrent shingles in her right axilla.  She had a good response in the past to Zovirax, and did not tolerate Valtrex.  She feels well otherwise.   The history is provided by the patient.  Rash  Location: right axilla. Quality: blistering, burning, dryness, painful, redness and swelling   Quality: not draining, not itchy, not peeling and not weeping   Pain details:    Quality:  Burning   Severity:  Mild   Onset quality:  Sudden   Duration:  1 day   Timing:  Constant   Progression:  Worsening Severity:  Mild Onset quality:  Sudden Duration:  1 day Timing:  Constant Progression:  Worsening Chronicity:  Recurrent Context: not animal contact, not chemical exposure, not exposure to similar rash, not food, not hot tub use, not insect bite/sting, not medications, not new detergent/soap, not nuts, not plant contact and not sick contacts   Relieved by:  None tried Worsened by:  Nothing Ineffective treatments:  None tried Associated symptoms: no abdominal pain, no fatigue, no fever, no headaches, no induration, no joint pain, no myalgias, no nausea and no throat swelling     Past Medical History:  Diagnosis Date  . Anxiety   . Chronic kidney disease   . Depression   . Epilepsy ([place])   . HSV infection   . Kidney stones   . Murmur   . PTSD (post-traumatic stress disorder)   . Skin cancer of forehead    If could be a more serious cancer and will not know until after surgery  . TB (tuberculosis)    Tested positive and treated    Patient Active Problem List   Diagnosis Date Noted  . Chest pain 09/08/2015  . Dyspnea 09/08/2015  . Hyperlipidemia 04/26/2015  . Herpes simplex type II infection 04/26/2014  .  Dizziness and giddiness 11/13/2013  . Vitamin D deficiency 10/29/2013  . Basal cell carcinoma of skin 10/29/2013  . Ventral hernia without obstruction or gangrene 01/27/2013  . Dissociative disorder 06/03/2012  . PTSD (post-traumatic stress disorder) 11/15/2011    Class: Chronic  . Skin cancer of forehead 11/15/2011    Class: Chronic  . Dissociative identity disorder 10/06/2011    Class: Chronic  . HOT FLASHES 09/03/2009  . ANXIETY DISORDER, GENERALIZED 06/14/2009  . KNEE PAIN 01/26/2009  . Other convulsions 01/26/2009    Past Surgical History:  Procedure Laterality Date  . CERVICAL POLYPECTOMY    . CHOLECYSTECTOMY    . KIDNEY SURGERY    . LAPAROSCOPIC NEPHRECTOMY  2010.     Dr. Angeline Slim  at [facility] for large stone  . left knee surgery    . Removal of basil cell carcinoma      OB History    Gravida Para Term Preterm AB Living   1 0       0   SAB TAB Ectopic Multiple Live Births   0               Home Medications    Prior to Admission medications   Medication Sig Start Date End Date Taking? Authorizing Provider  cholecalciferol (VITAMIN D) 1000 UNITS tablet Take 2,000 Units by mouth daily.  Yes Historical Provider, MD  diazepam (VALIUM) 2 MG tablet Take 1 tablet (2 mg total) by mouth every 8 (eight) hours as needed for anxiety. 05/08/16  Yes Gregor Hams, MD  EPINEPHrine 0.3 mg/0.3 mL IJ SOAJ injection Inject 0.3 mLs (0.3 mg total) into the muscle once. 05/14/15  Yes Hali Marry, MD  ibuprofen (ADVIL,MOTRIN) 400 MG tablet Take 1 every 6 hours with food as needed for moderate to severe pain 06/29/16  Yes Jacqulyn Cane, MD  L-Lysine 500 MG TABS Take 500 mg by mouth.   Yes Historical Provider, MD  lansoprazole (PREVACID) 30 MG capsule Take 1 capsule (30 mg total) by mouth daily at 12 noon. 08/30/15  Yes Hali Marry, MD  Melatonin 5 MG TABS Take 1 tablet (5 mg total) by mouth daily. 10/07/13  Yes Hali Marry, MD  Multiple Vitamins-Minerals (MULTIVITAMIN WITH  MINERALS) tablet Take 1 tablet by mouth daily.   Yes Historical Provider, MD  mupirocin ointment (BACTROBAN) 2 % Apply to affected area TID for 7 days. 05/08/16  Yes Gregor Hams, MD  acyclovir (ZOVIRAX) 800 MG tablet Take 1 tablet (800 mg total) by mouth 5 (five) times daily. 07/15/16   Kandra Nicolas, MD    Family History Family History  Problem Relation Age of Onset  . Bipolar disorder Mother   . Heart attack Mother     MI in her late 58s  . Bipolar disorder Sister   . Cancer Sister     skin  . Dementia Brother   . Cancer Cousin     breast  . Hypertension Brother   . Cancer Sister     breast,brain,cervical ovarian  . Cancer Maternal Aunt     breast  . Anemia Neg Hx   . Arrhythmia Neg Hx   . Asthma Neg Hx   . Clotting disorder Neg Hx   . Fainting Neg Hx   . Heart disease Neg Hx   . Heart failure Neg Hx   . Hyperlipidemia Neg Hx     Social History Social History  Substance Use Topics  . Smoking status: Never Smoker  . Smokeless tobacco: Never Used  . Alcohol use 0.0 oz/week     Comment: Occasional wine     Allergies   Demerol [meperidine]; Latex; Nickel; Ciprofloxacin; Citrus; Codeine; Erythromycin; Lactose intolerance (gi); Lidocaine; Meperidine hcl; Peanut oil; and Soy allergy   Review of Systems Review of Systems  Constitutional: Negative for fatigue and fever.  Gastrointestinal: Negative for abdominal pain and nausea.  Musculoskeletal: Negative for arthralgias and myalgias.  Skin: Positive for rash.  Neurological: Negative for headaches.  All other systems reviewed and are negative.    Physical Exam Triage Vital Signs ED Triage Vitals  Enc Vitals Group     BP 07/15/16 1323 113/75     Pulse Rate 07/15/16 1323 62     Resp --      Temp 07/15/16 1323 98.3 F (36.8 C)     Temp Source 07/15/16 1323 Oral     SpO2 07/15/16 1323 99 %     Weight 07/15/16 1324 140 lb (63.5 kg)     Height 07/15/16 1324 5\' 4"  (1.626 m)     Head Circumference --      Peak  Flow --      Pain Score --      Pain Loc --      Pain Edu? --      Excl. in [place]? --  No data found.   Updated Vital Signs BP 113/75 (BP Location: Left Arm)   Pulse 62   Temp 98.3 F (36.8 C) (Oral)   Ht 5\' 4"  (1.626 m)   Wt 140 lb (63.5 kg)   SpO2 99%   BMI 24.03 kg/m   Visual Acuity Right Eye Distance:   Left Eye Distance:   Bilateral Distance:    Right Eye Near:   Left Eye Near:    Bilateral Near:     Physical Exam  Constitutional: She appears well-developed and well-nourished. No distress.  HENT:  Head: Normocephalic.  Right Ear: External ear normal.  Left Ear: External ear normal.  Nose: Nose normal.  Mouth/Throat: Oropharynx is clear and moist.  Eyes: Conjunctivae are normal. Pupils are equal, round, and reactive to light.  Neck: Neck supple.  Cardiovascular: Normal heart sounds.   Pulmonary/Chest: Breath sounds normal.    Right anterior chest adjacent to axilla has a 1.5cm diameter patch of vesicles on erythematous base as noted on diagram.   Musculoskeletal: She exhibits no edema.  Lymphadenopathy:    She has no cervical adenopathy.  Neurological: She is alert.  Skin: Skin is warm.  Nursing note and vitals reviewed.    UC Treatments / Results  Labs (all labs ordered are listed, but only abnormal results are displayed) Labs Reviewed - No data to display  EKG  EKG Interpretation None       Radiology No results found.  Procedures Procedures (including critical care time)  Medications Ordered in UC Medications - No data to display   Initial Impression / Assessment and Plan / UC Course  I have reviewed the triage vital signs and the nursing notes.  Pertinent labs & imaging results that were available during my care of the patient were reviewed by me and considered in my medical decision making (see chart for details).  Clinical Course  Begin acyclovir 800mg , 5/day for one week. Followup with Family Doctor if not improved in one  week.      Final Clinical Impressions(s) / UC Diagnoses   Final diagnoses:  Herpes zoster without complication    New Prescriptions New Prescriptions   ACYCLOVIR (ZOVIRAX) 800 MG TABLET    Take 1 tablet (800 mg total) by mouth 5 (five) times daily.     Kandra Nicolas, MD 07/26/16 2206

## 2016-07-15 NOTE — ED Triage Notes (Signed)
Patient states she was previously diagnosed with Shingles and it came up again last night. More of a discomfort than pain, burning sensation. Area is under her right armpit area. Patient states previously treated with Zoviraz vs Valcyclovir which she did not tolerate well and states the brand vs generic is typically better for her.

## 2016-08-14 DIAGNOSIS — F4310 Post-traumatic stress disorder, unspecified: Secondary | ICD-10-CM | POA: Diagnosis not present

## 2016-08-21 DIAGNOSIS — F4310 Post-traumatic stress disorder, unspecified: Secondary | ICD-10-CM | POA: Diagnosis not present

## 2016-09-11 DIAGNOSIS — F4310 Post-traumatic stress disorder, unspecified: Secondary | ICD-10-CM | POA: Diagnosis not present

## 2016-09-18 DIAGNOSIS — F4310 Post-traumatic stress disorder, unspecified: Secondary | ICD-10-CM | POA: Diagnosis not present

## 2016-10-09 DIAGNOSIS — F4310 Post-traumatic stress disorder, unspecified: Secondary | ICD-10-CM | POA: Diagnosis not present

## 2016-11-06 DIAGNOSIS — F4310 Post-traumatic stress disorder, unspecified: Secondary | ICD-10-CM | POA: Diagnosis not present

## 2016-11-11 ENCOUNTER — Other Ambulatory Visit: Payer: Self-pay | Admitting: Family Medicine

## 2016-11-27 DIAGNOSIS — F4310 Post-traumatic stress disorder, unspecified: Secondary | ICD-10-CM | POA: Diagnosis not present

## 2016-12-04 DIAGNOSIS — F4310 Post-traumatic stress disorder, unspecified: Secondary | ICD-10-CM | POA: Diagnosis not present

## 2016-12-13 ENCOUNTER — Telehealth: Payer: Self-pay | Admitting: Family Medicine

## 2016-12-13 NOTE — Telephone Encounter (Signed)
I called pt and left a message with her husband for her to call our office for a F/u appt with [facility]

## 2016-12-18 DIAGNOSIS — F4310 Post-traumatic stress disorder, unspecified: Secondary | ICD-10-CM | POA: Diagnosis not present

## 2016-12-25 DIAGNOSIS — F4310 Post-traumatic stress disorder, unspecified: Secondary | ICD-10-CM | POA: Diagnosis not present

## 2017-01-01 DIAGNOSIS — F4310 Post-traumatic stress disorder, unspecified: Secondary | ICD-10-CM | POA: Diagnosis not present

## 2017-01-03 ENCOUNTER — Encounter: Payer: Self-pay | Admitting: Osteopathic Medicine

## 2017-01-03 ENCOUNTER — Ambulatory Visit (INDEPENDENT_AMBULATORY_CARE_PROVIDER_SITE_OTHER): Payer: Medicare Other | Admitting: Osteopathic Medicine

## 2017-01-03 VITALS — BP 125/61 | HR 81 | Temp 99.0°F

## 2017-01-03 DIAGNOSIS — J069 Acute upper respiratory infection, unspecified: Secondary | ICD-10-CM

## 2017-01-03 DIAGNOSIS — J029 Acute pharyngitis, unspecified: Secondary | ICD-10-CM | POA: Diagnosis not present

## 2017-01-03 DIAGNOSIS — B9789 Other viral agents as the cause of diseases classified elsewhere: Secondary | ICD-10-CM

## 2017-01-03 LAB — POCT RAPID STREP A (OFFICE): Rapid Strep A Screen: NEGATIVE

## 2017-01-03 MED ORDER — BENZONATATE 200 MG PO CAPS: 200.0000 mg | ORAL_CAPSULE | Freq: Three times a day (TID) | ORAL | 0 refills | Status: DC | PRN

## 2017-01-03 MED ORDER — IPRATROPIUM BROMIDE 0.03 % NA SOLN: 2.0000 | Freq: Four times a day (QID) | NASAL | 0 refills | Status: DC

## 2017-01-03 NOTE — Patient Instructions (Addendum)
Note: the following list assumes no pregnancy, normal liver & kidney function and no other drug interactions. Dr. Sheppard Coil has highlighted medications which are safe for you to use, but these may not be appropriate for everyone. Always ask a pharmacist or qualified medical provider if there are any questions!    Aches/Pains, Fever Acetaminophen (Tylenol) 500 mg tablets - take max 2 tablets (1000 mg) every 6 hours (4 times per day)  Ibuprofen (Motrin) 200 mg tablets - take max 4 tablets (800 mg) every 6 hours  Sinus Congestion Prescription Atrovent Nasal Saline if desired Phenylephrine (Sudafed) 10 mg tablets every 4 hours (or the 12-hour formulation) Diphenhydramine (Benadryl) 25 mg tablets - take max 2 tablets every 4 hours  Cough & Sore Throat Prescription cough pills or syrups Dextromethorphan (Robitussin, others) - cough suppressant Guaifenesin (Robitussin, Mucinex, others) - expectorant (helps cough up mucus) Lozenges w/ Benzocaine + Menthol (Cepacol) Honey - as much as you want! Teas which "coat the throat" - look for ingredients Elm Bark, Licorice Root, Marshmallow Root  Other Zinc Lozenges within 24 hours of symptoms onset - mixed evidence this shortens the duration of the common cold Don't waste your money on Vitamin C or Echinacea

## 2017-01-03 NOTE — Progress Notes (Signed)
HPI: Autumn Garcia is a 56 y.o. female who presents to [facility] 01/03/17 for chief complaint of:  Chief Complaint  Patient presents with  . Sore Throat    Acute Illness: Sore throat, stuffy/runny nose, typically clear but occasionally greenish or yellowish mucus, coughing.  Over-the-counter medications tried include Mucinex, cough drops.   Past medical, social and family history reviewed.   Immune compromising conditions or other risk factors: none  Current medications and allergies reviewed.     Review of Systems:  Constitutional: subjective fever/chills  HEENT: sinus headache, Yes  sore throat, No  swollen glands  Cardiovascular: No chest pain  Respiratory:Yes  cough, No  shortness of breath  Gastrointestinal: No  nausea, No  vomiting,  No  diarrhea  Musculoskeletal:   No  myalgia/arthralgia  Skin/Integument:  No  rash   Detailed Exam:  BP 125/61   Pulse 81   Temp 99 F (37.2 C) (Oral)   SpO2 98%   Constitutional:   VSS, see above.   General Appearance: alert, well-developed, well-nourished, NAD  Eyes:   Normal lids and conjunctive, non-icteric sclera  Ears, Nose, Mouth, Throat:   Normal external inspection ears/nares  Normal mouth/lips/gums, MMM  normal TM w/ mild clear effusion bl  posterior pharynx without erythema, without exudate  nasal mucosa normal  Skin:  Normal inspection, no rash or concerning lesions noted on limited exam  Neck:   No masses, trachea midline. mild tender but not enlarged lymph nodes  Respiratory:   Normal respiratory effort.   No  wheeze/rhonchi/rales  Cardiovascular:   S1/S2 normal, no murmur/rub/gallop auscultated. RRR.   Results for orders placed or performed in visit on 01/03/17 (from the past 72 hour(s))  POCT rapid strep A     Status: None   Collection Time: 01/03/17 11:16 AM  Result Value Ref Range   Rapid Strep A Screen Negative Negative        ASSESSMENT/PLAN: Advised likely viral upper respiratory infection, conservative management. No indication for antibiotic therapy at this time but gets worse would consider treatment for sinusitis or bronchitis depending on symptoms.  Patient requested refill of Valium, this is already last filled by someone other than the patient's PCP, will route PCP for review, I advised no Valium refill from myself today  Sore throat - Plan: POCT rapid strep A  Viral upper respiratory illness     Patient Instructions  Note: the following list assumes no pregnancy, normal liver & kidney function and no other drug interactions. Dr. Sheppard Coil has highlighted medications which are safe for you to use, but these may not be appropriate for everyone. Always ask a pharmacist or qualified medical provider if there are any questions!    Aches/Pains, Fever Acetaminophen (Tylenol) 500 mg tablets - take max 2 tablets (1000 mg) every 6 hours (4 times per day)  Ibuprofen (Motrin) 200 mg tablets - take max 4 tablets (800 mg) every 6 hours  Sinus Congestion Prescription Atrovent Nasal Saline if desired Phenylephrine (Sudafed) 10 mg tablets every 4 hours (or the 12-hour formulation) Diphenhydramine (Benadryl) 25 mg tablets - take max 2 tablets every 4 hours  Cough & Sore Throat Prescription cough pills or syrups Dextromethorphan (Robitussin, others) - cough suppressant Guaifenesin (Robitussin, Mucinex, others) - expectorant (helps cough up mucus) Lozenges w/ Benzocaine + Menthol (Cepacol) Honey - as much as you want! Teas which "coat the throat" - look for ingredients Elm Bark, Licorice Root, Marshmallow Root  Other Zinc Lozenges  within 24 hours of symptoms onset - mixed evidence this shortens the duration of the common cold Don't waste your money on Vitamin C or Echinacea       Visit summary was printed for the patient with medications and pertinent instructions for patient to review. ER/RTC  precautions reviewed. All questions answered. Return if symptoms worsen or fail to improve. Call us if sinus symptoms persist 7+ days.

## 2017-01-22 DIAGNOSIS — F4310 Post-traumatic stress disorder, unspecified: Secondary | ICD-10-CM | POA: Diagnosis not present

## 2017-02-05 DIAGNOSIS — F4310 Post-traumatic stress disorder, unspecified: Secondary | ICD-10-CM | POA: Diagnosis not present

## 2017-02-12 DIAGNOSIS — F4310 Post-traumatic stress disorder, unspecified: Secondary | ICD-10-CM | POA: Diagnosis not present

## 2017-02-26 DIAGNOSIS — F4310 Post-traumatic stress disorder, unspecified: Secondary | ICD-10-CM | POA: Diagnosis not present

## 2017-03-05 ENCOUNTER — Telehealth: Payer: Self-pay

## 2017-03-05 NOTE — Telephone Encounter (Signed)
OK to refill

## 2017-03-05 NOTE — Telephone Encounter (Signed)
Pt is requesting a refill on her epipen, She has not been seen by you since 2016, but saw Sheppard Coil in April for a sore throat.  Please advise.

## 2017-03-06 MED ORDER — EPINEPHRINE 0.3 MG/0.3ML IJ SOAJ: 0.3000 mg | Freq: Once | INTRAMUSCULAR | 99 refills | Status: AC

## 2017-03-06 NOTE — Telephone Encounter (Signed)
rx sent.Sakira Dahmer Lynetta  

## 2017-03-12 DIAGNOSIS — F4310 Post-traumatic stress disorder, unspecified: Secondary | ICD-10-CM | POA: Diagnosis not present

## 2017-03-22 DIAGNOSIS — F4310 Post-traumatic stress disorder, unspecified: Secondary | ICD-10-CM | POA: Diagnosis not present

## 2017-04-05 DIAGNOSIS — F4310 Post-traumatic stress disorder, unspecified: Secondary | ICD-10-CM | POA: Diagnosis not present

## 2017-04-19 DIAGNOSIS — F4310 Post-traumatic stress disorder, unspecified: Secondary | ICD-10-CM | POA: Diagnosis not present

## 2017-07-05 DIAGNOSIS — F4310 Post-traumatic stress disorder, unspecified: Secondary | ICD-10-CM | POA: Diagnosis not present

## 2017-11-06 ENCOUNTER — Ambulatory Visit (INDEPENDENT_AMBULATORY_CARE_PROVIDER_SITE_OTHER): Payer: Medicare Other | Admitting: Physician Assistant

## 2017-11-06 ENCOUNTER — Encounter: Payer: Self-pay | Admitting: Physician Assistant

## 2017-11-06 VITALS — BP 128/82 | HR 68 | Temp 98.5°F

## 2017-11-06 DIAGNOSIS — L739 Follicular disorder, unspecified: Secondary | ICD-10-CM

## 2017-11-06 MED ORDER — MUPIROCIN 2 % EX OINT: TOPICAL_OINTMENT | CUTANEOUS | 0 refills | Status: DC

## 2017-11-06 MED ORDER — HYDROXYZINE HCL 10 MG PO TABS: 10.0000 mg | ORAL_TABLET | Freq: Every evening | ORAL | 0 refills | Status: DC | PRN

## 2017-11-06 NOTE — Patient Instructions (Signed)
- Bactroban three times daily to the affected areas - wait 15 minutes before applying a moisturizer (Cerave, Cetaphil, Aquafor, Vaseline etc.) - keep skin moisturized every 3 hours - Avoid scented soaps, perfumes, etc. - Avoid all other topical creams, medications etc. - Avoid shaving until rash is completely resolved. Throw away old razors - Do not share razor or towels   Folliculitis Folliculitis is inflammation of the hair follicles. Folliculitis most commonly occurs on the scalp, thighs, legs, back, and buttocks. However, it can occur anywhere on the body. What are the causes? This condition may be caused by:  A bacterial infection (common).  A fungal infection.  A viral infection.  Coming into contact with certain chemicals, especially oils and tars.  Shaving or waxing.  Applying greasy ointments or creams to your skin often.  Long-lasting folliculitis and folliculitis that keeps coming back can be caused by bacteria that live in the nostrils. What increases the risk? This condition is more likely to develop in people with:  A weakened immune system.  Diabetes.  Obesity.  What are the signs or symptoms? Symptoms of this condition include:  Redness.  Soreness.  Swelling.  Itching.  Small white or yellow, pus-filled, itchy spots (pustules) that appear over a reddened area. If there is an infection that goes deep into the follicle, these may develop into a boil (furuncle).  A group of closely packed boils (carbuncle). These tend to form in hairy, sweaty areas of the body.  How is this diagnosed? This condition is diagnosed with a skin exam. To find what is causing the condition, your health care provider may take a sample of one of the pustules or boils for testing. How is this treated? This condition may be treated by:  Applying warm compresses to the affected areas.  Taking an antibiotic medicine or applying an antibiotic medicine to the skin.  Applying  or bathing with an antiseptic solution.  Taking an over-the-counter medicine to help with itching.  Having a procedure to drain any pustules or boils. This may be done if a pustule or boil contains a lot of pus or fluid.  Laser hair removal. This may be done to treat long-lasting folliculitis.  Follow these instructions at home:  If directed, apply heat to the affected area as often as told by your health care provider. Use the heat source that your health care provider recommends, such as a moist heat pack or a heating pad. ? Place a towel between your skin and the heat source. ? Leave the heat on for 20-30 minutes. ? Remove the heat if your skin turns bright red. This is especially important if you are unable to feel pain, heat, or cold. You may have a greater risk of getting burned.  If you were prescribed an antibiotic medicine, use it as told by your health care provider. Do not stop using the antibiotic even if you start to feel better.  Take over-the-counter and prescription medicines only as told by your health care provider.  Do not shave irritated skin.  Keep all follow-up visits as told by your health care provider. This is important. Get help right away if:  You have more redness, swelling, or pain in the affected area.  Red streaks are spreading from the affected area.  You have a fever. This information is not intended to replace advice given to you by your health care provider. Make sure you discuss any questions you have with your health care provider. Document  Released: 11/27/2001 Document Revised: 04/07/2016 Document Reviewed: 07/09/2015 Elsevier Interactive Patient Education  [employer].

## 2017-11-06 NOTE — Progress Notes (Signed)
hyrdoHPI:                                                                Miri Agusta Hackenberg is a 57 y.o. female who presents to [facility] today for rash  Onset: 6 weeks Location: started on bilateral lower legs Duration: constant Character: redness and itching Aggravating factors / Triggers: none Treatments tried: has avoided shaving, tea tree oil, Hibiclens, Cerave, Hydrocortisone Pertinent PMH: history of BCC   Recent illness / systemic symptoms: none  Medication / drug exposure: none Recent travel: none Animal/insect exposure: pets at home History of allergies: yes Exposure to new soaps, perfumes, cleaning products: none Exposure to chemicals: none   No flowsheet data found.  No flowsheet data found.    Past Medical History:  Diagnosis Date  . Anxiety   . Chronic kidney disease   . Depression   . Eating disorder   . Epilepsy ([place])   . HSV infection   . Kidney stones   . Murmur   . PTSD (post-traumatic stress disorder)   . Skin cancer of forehead    If could be a more serious cancer and will not know until after surgery  . TB (tuberculosis)    Tested positive and treated   Past Surgical History:  Procedure Laterality Date  . CERVICAL POLYPECTOMY    . CHOLECYSTECTOMY    . KIDNEY SURGERY    . LAPAROSCOPIC NEPHRECTOMY  2010.     Dr. Angeline Slim  at [facility] for large stone  . left knee surgery    . Removal of basil cell carcinoma     Social History   Tobacco Use  . Smoking status: Never Smoker  . Smokeless tobacco: Never Used  Substance Use Topics  . Alcohol use: Yes    Alcohol/week: 0.0 oz    Comment: Occasional wine   family history includes Bipolar disorder in her mother and sister; Cancer in her cousin, maternal aunt, sister, and sister; Dementia in her brother; Heart attack in her mother; Hypertension in her brother.    ROS: negative except as noted in the HPI  Medications: Current Outpatient  Medications  Medication Sig Dispense Refill  . cholecalciferol (VITAMIN D) 1000 UNITS tablet Take 2,000 Units by mouth daily.    . diazepam (VALIUM) 2 MG tablet Take 1 tablet (2 mg total) by mouth every 8 (eight) hours as needed for anxiety. 90 tablet 1  . EPINEPHrine 0.3 mg/0.3 mL IJ SOAJ injection Inject into the muscle.    . ibuprofen (ADVIL,MOTRIN) 400 MG tablet Take 1 every 6 hours with food as needed for moderate to severe pain 30 tablet 0  . ipratropium (ATROVENT) 0.03 % nasal spray Place 2 sprays into both nostrils 4 (four) times daily. 30 mL 0  . L-Lysine 500 MG TABS Take 500 mg by mouth.    . lansoprazole (PREVACID) 30 MG capsule Take 1 capsule (30 mg total) by mouth daily at 12 noon. 30 capsule 3  . Multiple Vitamins-Minerals (MULTIVITAMIN WITH MINERALS) tablet Take 1 tablet by mouth daily.    . hydrOXYzine (ATARAX/VISTARIL) 10 MG tablet Take 1-2 tablets (10-20 mg total) by mouth at bedtime as needed for itching. 30 tablet 0  . mupirocin ointment (BACTROBAN) 2 %  Apply to affected area TID for 7 days. 30 g 0   No current facility-administered medications for this visit.    Allergies  Allergen Reactions  . Demerol [Meperidine] Nausea And Vomiting, Nausea Only and Other (See Comments)    seizures  . Latex Rash  . Nickel     infections  . Ciprofloxacin     REACTION: sucidal  . Citrus     Geographic tongue  . Codeine     REACTION: Halluncination  . Erythromycin     REACTION: Vomiting  . Lactose Intolerance (Gi)     GI upset  . Lidocaine Other (See Comments)    Pt unsure if allergic. Last reaction at the dentist and had a seizure, but could have been injected into a vein  . Meperidine Hcl     REACTION: Muscle weakness, vomiting  . Peanut Oil     Other reaction(s): Other (See Comments) ORAL ULCERS  . Soy Allergy     Other reaction(s): Other (See Comments) MOUTH ULCER       Objective:  BP 128/82   Pulse 68   Temp 98.5 F (36.9 C) (Oral)  Gen:  alert, not  ill-appearing, no distress, appropriate for age 63: head normocephalic without obvious abnormality, conjunctiva and cornea clear, trachea midline Pulm: Normal work of breathing, normal phonation, clear to auscultation bilaterally, no wheezes, rales or rhonchi CV: Normal rate, regular rhythm, s1 and s2 distinct, no murmurs, clicks or rubs  Neuro: alert and oriented x 3, no tremor MSK: extremities atraumatic, normal gait and station Skin: bilateral anterior distal lower extremities with erythematous papules, no blistering or bullae    No results found for this or any previous visit (from the past 72 hour(s)). No results found.    Assessment and Plan: 57 y.o. female with   1. Folliculitis - Bactroban three times daily to the affected areas - wait 15 minutes before applying a moisturizer (Cerave, Cetaphil, Aquafor, Vaseline etc.) - keep skin moisturized every 3 hours - Avoid scented soaps, perfumes, etc. - Avoid all other topical creams, medications etc. - Avoid shaving until rash is completely resolved. Throw away old razors - Do not share razor or towels - mupirocin ointment (BACTROBAN) 2 %; Apply to affected area TID for 7 days.  Dispense: 30 g; Refill: 0  Patient education and anticipatory guidance given Patient agrees with treatment plan Follow-up with PCP in 1 week or sooner as needed if symptoms worsen or fail to improve  Darlyne Russian PA-C

## 2017-11-07 ENCOUNTER — Encounter: Payer: Self-pay | Admitting: Physician Assistant

## 2017-11-26 ENCOUNTER — Encounter: Payer: Self-pay | Admitting: Family Medicine

## 2018-02-20 ENCOUNTER — Ambulatory Visit (INDEPENDENT_AMBULATORY_CARE_PROVIDER_SITE_OTHER): Payer: Medicare Other | Admitting: Licensed Clinical Social Worker

## 2018-02-20 DIAGNOSIS — F4310 Post-traumatic stress disorder, unspecified: Secondary | ICD-10-CM

## 2018-02-20 DIAGNOSIS — F4481 Dissociative identity disorder: Secondary | ICD-10-CM | POA: Diagnosis not present
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
## 2018-03-04 ENCOUNTER — Ambulatory Visit (HOSPITAL_COMMUNITY): Payer: Self-pay | Admitting: Licensed Clinical Social Worker

## 2018-03-04 ENCOUNTER — Emergency Department (INDEPENDENT_AMBULATORY_CARE_PROVIDER_SITE_OTHER)
Admission: EM | Admit: 2018-03-04 | Discharge: 2018-03-04 | Disposition: A | Discharge status: Home / Self Care | Payer: Medicare Other | Source: Home / Self Care | Attending: Family Medicine | Admitting: Family Medicine

## 2018-03-04 ENCOUNTER — Other Ambulatory Visit: Payer: Self-pay

## 2018-03-04 DIAGNOSIS — B349 Viral infection, unspecified: Secondary | ICD-10-CM

## 2018-03-04 LAB — POCT URINALYSIS DIP (MANUAL ENTRY)
Bilirubin, UA: NEGATIVE
Glucose, UA: NEGATIVE mg/dL
Ketones, POC UA: NEGATIVE mg/dL
Leukocytes, UA: NEGATIVE
Nitrite, UA: NEGATIVE
Protein Ur, POC: NEGATIVE mg/dL
Spec Grav, UA: <=1.005 — AB (ref 1.010–1.025)
Urobilinogen, UA: 0.2 E.U./dL
pH, UA: 6 (ref 5.0–8.0)

## 2018-03-04 NOTE — ED Provider Notes (Signed)
[facility]    CSN: [id number] Arrival date & time: 03/04/18  1142     History   Chief Complaint Chief Complaint  Patient presents with  . Fatigue  . Generalized Body Aches  . Fever    HPI Autumn Garcia is a 57 y.o. female.   Patient reports that she felt like she had the flu five days ago with fever,chills, and fatigue.  She did not develop a cough, and generally improved.  Her husband was ill last week with diarrhea and fever, now resolved. Today, she awoke with increased fatigue, nausea (without vomiting), mild dizziness, headache, and soreness in her neck and shoulders.  The history is provided by the patient and the spouse.    Past Medical History:  Diagnosis Date  . Anxiety   . Chronic kidney disease   . Depression   . Eating disorder   . Epilepsy ([place])   . HSV infection   . Kidney stones   . Murmur   . PTSD (post-traumatic stress disorder)   . Skin cancer of forehead    If could be a more serious cancer and will not know until after surgery  . TB (tuberculosis)    Tested positive and treated    Patient Active Problem List   Diagnosis Date Noted  . Chest pain 09/08/2015  . Dyspnea 09/08/2015  . Hyperlipidemia 04/26/2015  . Herpes simplex type II infection 04/26/2014  . Dizziness and giddiness 11/13/2013  . Vitamin D deficiency 10/29/2013  . Basal cell carcinoma of skin 10/29/2013  . Ventral hernia without obstruction or gangrene 01/27/2013  . Dissociative disorder 06/03/2012  . PTSD (post-traumatic stress disorder) 11/15/2011    Class: Chronic  . Skin cancer of forehead 11/15/2011    Class: Chronic  . Dissociative identity disorder ([place]) 10/06/2011    Class: Chronic  . HOT FLASHES 09/03/2009  . ANXIETY DISORDER, GENERALIZED 06/14/2009  . KNEE PAIN 01/26/2009  . Other convulsions 01/26/2009    Past Surgical History:  Procedure Laterality Date  . CERVICAL POLYPECTOMY    . CHOLECYSTECTOMY    . KIDNEY SURGERY    .  LAPAROSCOPIC NEPHRECTOMY  2010.     Dr. Angeline Slim  at [facility] for large stone  . left knee surgery    . Removal of basil cell carcinoma      OB History    Gravida  1   Para  0   Term      Preterm      AB      Living  0     SAB  0   TAB      Ectopic      Multiple      Live Births               Home Medications    Prior to Admission medications   Medication Sig Start Date End Date Taking? Authorizing Provider  cholecalciferol (VITAMIN D) 1000 UNITS tablet Take 2,000 Units by mouth daily.    [provider]  diazepam (VALIUM) 2 MG tablet Take 1 tablet (2 mg total) by mouth every 8 (eight) hours as needed for anxiety. 05/08/16   Gregor Hams, MD  EPINEPHrine 0.3 mg/0.3 mL IJ SOAJ injection Inject into the muscle. 01/06/14   [provider]  hydrOXYzine (ATARAX/VISTARIL) 10 MG tablet Take 1-2 tablets (10-20 mg total) by mouth at bedtime as needed for itching. 11/06/17   Trixie Dredge, PA-C  ibuprofen (ADVIL,MOTRIN) 400 MG tablet  Take 1 every 6 hours with food as needed for moderate to severe pain 06/29/16   Jacqulyn Cane, MD  ipratropium (ATROVENT) 0.03 % nasal spray Place 2 sprays into both nostrils 4 (four) times daily. 01/03/17   Emeterio Reeve, DO  L-Lysine 500 MG TABS Take 500 mg by mouth.    [provider]  lansoprazole (PREVACID) 30 MG capsule Take 1 capsule (30 mg total) by mouth daily at 12 noon. 08/30/15   Hali Marry, MD  Multiple Vitamins-Minerals (MULTIVITAMIN WITH MINERALS) tablet Take 1 tablet by mouth daily.    [provider]  mupirocin ointment (BACTROBAN) 2 % Apply to affected area TID for 7 days. 11/06/17   Trixie Dredge, PA-C    Family History Family History  Problem Relation Age of Onset  . Bipolar disorder Mother   . Heart attack Mother        MI in her late 19s  . Bipolar disorder Sister   . Cancer Sister        skin  . Dementia Brother   . Cancer Cousin        breast  .  Hypertension Brother   . Cancer Sister        breast,brain,cervical ovarian  . Cancer Maternal Aunt        breast  . Anemia Neg Hx   . Arrhythmia Neg Hx   . Asthma Neg Hx   . Clotting disorder Neg Hx   . Fainting Neg Hx   . Heart disease Neg Hx   . Heart failure Neg Hx   . Hyperlipidemia Neg Hx     Social History Social History   Tobacco Use  . Smoking status: Never Smoker  . Smokeless tobacco: Never Used  Substance Use Topics  . Alcohol use: Yes    Alcohol/week: 0.0 oz    Comment: Occasional wine  . Drug use: No     Allergies   Demerol [meperidine]; Latex; Nickel; Ciprofloxacin; Citrus; Codeine; Erythromycin; Lactose intolerance (gi); Lidocaine; Meperidine hcl; Peanut oil; and Soy allergy   Review of Systems Review of Systems No sore throat No cough No pleuritic pain No wheezing + nasal congestion No post-nasal drainage No sinus pain/pressure No itchy/red eyes No earache No hemoptysis No SOB + fever, resolved + nausea No vomiting No abdominal pain No diarrhea No urinary symptoms No skin rash + fatigue + myalgias + headache     Physical Exam Triage Vital Signs ED Triage Vitals  Enc Vitals Group     BP 03/04/18 1212 136/79     Pulse Rate 03/04/18 1212 69     Resp --      Temp 03/04/18 1212 98.6 F (37 C)     Temp Source 03/04/18 1212 Oral     SpO2 03/04/18 1212 99 %     Weight 03/04/18 1213 160 lb (72.6 kg)     Height 03/04/18 1213 5\' 4"  (1.626 m)     Head Circumference --      Peak Flow --      Pain Score 03/04/18 1212 0     Pain Loc --      Pain Edu? --      Excl. in [place]? --    No data found.  Updated Vital Signs BP 136/79 (BP Location: Right Arm)   Pulse 69   Temp 98.6 F (37 C) (Oral)   Ht 5\' 4"  (1.626 m)   Wt 160 lb (72.6 kg) Comment: Pt requested not to  be weighed as she has an eating disorder.  SpO2 99%   BMI 27.46 kg/m   Visual Acuity Right Eye Distance:   Left Eye Distance:   Bilateral Distance:    Right Eye Near:    Left Eye Near:    Bilateral Near:     Physical Exam Nursing notes and Vital Signs reviewed. Appearance:  Patient appears stated age, and in no acute distress Eyes:  Pupils are equal, round, and reactive to light and accomodation.  Extraocular movement is intact.  Conjunctivae are not inflamed  Ears:  Canals normal.  Tympanic membranes normal.  Nose:  Mildly congested turbinates.  No sinus tenderness.   Pharynx:  Normal Neck:  Supple.  Enlarged posterior/lateral nodes are palpated bilaterally, tender to palpation on the left.   Lungs:  Clear to auscultation.  Breath sounds are equal.  Moving air well. Heart:  Regular rate and rhythm without murmurs, rubs, or gallops.  Abdomen:  Nontender without masses or hepatosplenomegaly.  Bowel sounds are present.  No CVA or flank tenderness.  Extremities:  No edema.  Skin:  No rash present.    UC Treatments / Results  Labs (all labs ordered are listed, but only abnormal results are displayed) Labs Reviewed  POCT URINALYSIS DIP (MANUAL ENTRY) - Abnormal; Notable for the following components:      Result Value   Spec Grav, UA <=1.005 (*)    Blood, UA trace-lysed (*)    All other components within normal limits  POCT CBC W AUTO DIFF ([facility]):  Unable to obtain specimen.    EKG None  Radiology No results found.  Procedures Procedures (including critical care time)  Medications Ordered in UC Medications - No data to display  Initial Impression / Assessment and Plan / UC Course  I have reviewed the triage vital signs and the nursing notes.  Pertinent labs & imaging results that were available during my care of the patient were reviewed by me and considered in my medical decision making (see chart for details).    Normal urinalysis (unable to obtain blood specimen for CBC) Suspect early viral URI. Followup with Family Doctor if not improved in about 4 days.   Final Clinical Impressions(s) / UC Diagnoses   Final  diagnoses:  Viral syndrome     Discharge Instructions     Continue increased fluid intake.  If cold-like symptoms develop, try the following: Take plain guaifenesin (1200mg  extended release tabs such as Mucinex) twice daily, with plenty of water, for cough and congestion.  Get adequate rest.   May use Afrin nasal spray (or generic oxymetazoline) each morning for about 5 days and then discontinue.  Also recommend using saline nasal spray several times daily and saline nasal irrigation (AYR is a common brand).   Try warm salt water gargles for sore throat.  Stop all antihistamines for now, and other non-prescription cough/cold preparations. May take Tylenol for fever, body aches, etc. May take Delsym Cough Suppressant at bedtime for nighttime cough.       ED Prescriptions    None         Kandra Nicolas, MD 03/06/18 1840

## 2018-03-04 NOTE — ED Triage Notes (Signed)
Pt felt like she had the Flu last Wednesday, but felt better over the weekend.  Today she had a low grade fever, generalized body aches, and weakness.

## 2018-03-04 NOTE — Discharge Instructions (Addendum)
Continue increased fluid intake.  If cold-like symptoms develop, try the following: Take plain guaifenesin (1200mg  extended release tabs such as Mucinex) twice daily, with plenty of water, for cough and congestion.  Get adequate rest.   May use Afrin nasal spray (or generic oxymetazoline) each morning for about 5 days and then discontinue.  Also recommend using saline nasal spray several times daily and saline nasal irrigation (AYR is a common brand).   Try warm salt water gargles for sore throat.  Stop all antihistamines for now, and other non-prescription cough/cold preparations. May take Tylenol for fever, body aches, etc. May take Delsym Cough Suppressant at bedtime for nighttime cough.

## 2018-03-06 ENCOUNTER — Telehealth: Payer: Self-pay

## 2018-03-06 NOTE — Telephone Encounter (Signed)
Feeling a little better, will follow upas needed.

## 2018-03-11 ENCOUNTER — Ambulatory Visit (INDEPENDENT_AMBULATORY_CARE_PROVIDER_SITE_OTHER): Payer: Medicare Other | Admitting: Licensed Clinical Social Worker

## 2018-03-11 DIAGNOSIS — F428 Other obsessive-compulsive disorder: Secondary | ICD-10-CM | POA: Diagnosis not present

## 2018-03-11 DIAGNOSIS — F4481 Dissociative identity disorder: Secondary | ICD-10-CM

## 2018-03-11 DIAGNOSIS — F4310 Post-traumatic stress disorder, unspecified: Secondary | ICD-10-CM
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
## 2018-03-15 ENCOUNTER — Telehealth (HOSPITAL_COMMUNITY): Payer: Self-pay | Admitting: Licensed Clinical Social Worker

## 2018-03-15 NOTE — Telephone Encounter (Signed)
Pt is aware that you recommend other therapist to treat her DID.  She would like to know if you talked to our other therapist mary to see if she would be willing to see her.  She like the convince of the [place] office.   CB # [phone]

## 2018-03-18 ENCOUNTER — Ambulatory Visit (HOSPITAL_COMMUNITY): Payer: Medicare Other | Admitting: Licensed Clinical Social Worker

## 2018-03-18 NOTE — Telephone Encounter (Signed)
Therapist returned call from patient.  Patient's husband, [name] said she wasn't available.  Informed him that the other therapist in this office will not be able to see patient.  Provided him with contact information for another potential therapist in [place]: [facility]. [place], [place] [id number] [phone]

## 2018-06-03 DIAGNOSIS — F4481 Dissociative identity disorder: Secondary | ICD-10-CM | POA: Diagnosis not present

## 2018-06-03 DIAGNOSIS — F4310 Post-traumatic stress disorder, unspecified: Secondary | ICD-10-CM | POA: Diagnosis not present

## 2018-06-10 DIAGNOSIS — F4310 Post-traumatic stress disorder, unspecified: Secondary | ICD-10-CM | POA: Diagnosis not present

## 2018-06-10 DIAGNOSIS — F4481 Dissociative identity disorder: Secondary | ICD-10-CM | POA: Diagnosis not present

## 2018-07-01 DIAGNOSIS — F4310 Post-traumatic stress disorder, unspecified: Secondary | ICD-10-CM | POA: Diagnosis not present

## 2018-07-12 DIAGNOSIS — F4310 Post-traumatic stress disorder, unspecified: Secondary | ICD-10-CM | POA: Diagnosis not present

## 2018-07-19 DIAGNOSIS — F4310 Post-traumatic stress disorder, unspecified: Secondary | ICD-10-CM | POA: Diagnosis not present

## 2018-07-26 DIAGNOSIS — F4310 Post-traumatic stress disorder, unspecified: Secondary | ICD-10-CM | POA: Diagnosis not present

## 2018-07-29 DIAGNOSIS — F4310 Post-traumatic stress disorder, unspecified: Secondary | ICD-10-CM | POA: Diagnosis not present

## 2018-08-09 DIAGNOSIS — F4310 Post-traumatic stress disorder, unspecified: Secondary | ICD-10-CM | POA: Diagnosis not present

## 2018-08-23 DIAGNOSIS — F4310 Post-traumatic stress disorder, unspecified: Secondary | ICD-10-CM | POA: Diagnosis not present

## 2018-09-05 DIAGNOSIS — F4310 Post-traumatic stress disorder, unspecified: Secondary | ICD-10-CM | POA: Diagnosis not present

## 2018-09-27 ENCOUNTER — Ambulatory Visit: Payer: Self-pay | Admitting: Physician Assistant

## 2018-09-27 ENCOUNTER — Encounter: Payer: Self-pay | Admitting: Family Medicine

## 2018-09-27 ENCOUNTER — Ambulatory Visit (INDEPENDENT_AMBULATORY_CARE_PROVIDER_SITE_OTHER): Payer: Medicare Other | Admitting: Family Medicine

## 2018-09-27 VITALS — BP 121/66 | HR 68

## 2018-09-27 DIAGNOSIS — Z91030 Bee allergy status: Secondary | ICD-10-CM

## 2018-09-27 DIAGNOSIS — L739 Follicular disorder, unspecified: Secondary | ICD-10-CM | POA: Diagnosis not present

## 2018-09-27 DIAGNOSIS — Z1211 Encounter for screening for malignant neoplasm of colon: Secondary | ICD-10-CM | POA: Diagnosis not present

## 2018-09-27 DIAGNOSIS — F4310 Post-traumatic stress disorder, unspecified: Secondary | ICD-10-CM

## 2018-09-27 MED ORDER — EPINEPHRINE 0.3 MG/0.3ML IJ SOAJ: 0.3000 mg | Freq: Once | INTRAMUSCULAR | 99 refills | Status: AC

## 2018-09-27 MED ORDER — MUPIROCIN 2 % EX OINT: TOPICAL_OINTMENT | CUTANEOUS | 0 refills | Status: DC

## 2018-09-27 MED ORDER — DIAZEPAM 2 MG PO TABS: 2.0000 mg | ORAL_TABLET | Freq: Three times a day (TID) | ORAL | 1 refills | Status: DC | PRN

## 2018-09-27 MED ORDER — TRIAMCINOLONE ACETONIDE 0.1 % EX CREA: 1.0000 "application " | TOPICAL_CREAM | Freq: Every day | CUTANEOUS | 1 refills | Status: DC | PRN

## 2018-09-27 NOTE — Progress Notes (Signed)
Subjective:    Patient ID: Autumn Garcia, female    DOB: July 05, 1961, 58 y.o.   MRN: [id number]  HPI 57 Yo female is here for rash that has been on and off since February.  She actually initially saw 1 of my partners they felt like it was likely a staph infection.  She notices that it gets worse after she shaves she will usually use the mupirocin ointment for about 7 days and that gets it to almost clear up.  And then eventually it comes back again.  She says she has tried using just a fresh razor every time.  She bleaches the bathtub and shower regularly.  She is tried washing regularly with Hibiclens soap.  But it still continues to flareup.  This last time was about a week ago when it started again.  PTSD-she would like a refill on her diazepam which she uses sparingly in fact we have not filled it in about 2 years.  We will send new prescription to her pharmacy.  She is working with a therapist/counselor regularly.  Needs new rx on her EpiPen.     Review of Systems  BP 121/66   Pulse 68   SpO2 100%     Allergies  Allergen Reactions  . Demerol [Meperidine] Nausea And Vomiting, Nausea Only and Other (See Comments)    seizures  . Latex Rash  . Nickel     infections  . Ciprofloxacin     REACTION: sucidal  . Citrus     Geographic tongue  . Codeine     REACTION: Halluncination  . Erythromycin     REACTION: Vomiting  . Lactose Intolerance (Gi)     GI upset  . Lidocaine Other (See Comments)    Pt unsure if allergic. Last reaction at the dentist and had a seizure, but could have been injected into a vein  . Meperidine Hcl     REACTION: Muscle weakness, vomiting  . Peanut Oil     Other reaction(s): Other (See Comments) ORAL ULCERS  . Soy Allergy     Other reaction(s): Other (See Comments) MOUTH ULCER    Past Medical History:  Diagnosis Date  . Anxiety   . Chronic kidney disease   . Depression   . Eating disorder   . Epilepsy ([place])   . HSV infection   .  Kidney stones   . Murmur   . PTSD (post-traumatic stress disorder)   . Skin cancer of forehead    If could be a more serious cancer and will not know until after surgery  . TB (tuberculosis)    Tested positive and treated    Past Surgical History:  Procedure Laterality Date  . CERVICAL POLYPECTOMY    . CHOLECYSTECTOMY    . KIDNEY SURGERY    . LAPAROSCOPIC NEPHRECTOMY  2010.     Dr. Angeline Slim  at [facility] for large stone  . left knee surgery    . Removal of basil cell carcinoma      Social History   Socioeconomic History  . Marital status: Married    Spouse name: [name]  . Number of children: 0  . Years of education: Not on file  . Highest education level: Not on file  Occupational History  . Occupation: disability    Employer: UNEMPLOYED  Social Needs  . Financial resource strain: Not on file  . Food insecurity:    Worry: Not on file    Inability: Not on file  .  Transportation needs:    Medical: Not on file    Non-medical: Not on file  Tobacco Use  . Smoking status: Never Smoker  . Smokeless tobacco: Never Used  Substance and Sexual Activity  . Alcohol use: Yes    Alcohol/week: 0.0 standard drinks    Comment: Occasional wine  . Drug use: No  . Sexual activity: Yes    Partners: Male  Lifestyle  . Physical activity:    Days per week: Not on file    Minutes per session: Not on file  . Stress: Not on file  Relationships  . Social connections:    Talks on phone: Not on file    Gets together: Not on file    Attends religious service: Not on file    Active member of club or organization: Not on file    Attends meetings of clubs or organizations: Not on file    Relationship status: Not on file  . Intimate partner violence:    Fear of current or ex partner: Not on file    Emotionally abused: Not on file    Physically abused: Not on file    Forced sexual activity: Not on file  Other Topics Concern  . Not on file  Social History Narrative   1 caffeinated drink per  day. Does yoga for  60 minutes 3 days per week.    Family History  Problem Relation Age of Onset  . Bipolar disorder Mother   . Heart attack Mother        MI in her late 78s  . Bipolar disorder Sister   . Cancer Sister        skin  . Dementia Brother   . Cancer Cousin        breast  . Hypertension Brother   . Cancer Sister        breast,brain,cervical ovarian  . Cancer Maternal Aunt        breast  . Anemia Neg Hx   . Arrhythmia Neg Hx   . Asthma Neg Hx   . Clotting disorder Neg Hx   . Fainting Neg Hx   . Heart disease Neg Hx   . Heart failure Neg Hx   . Hyperlipidemia Neg Hx     Outpatient Encounter Medications as of 09/27/2018  Medication Sig  . cholecalciferol (VITAMIN D) 1000 UNITS tablet Take 2,000 Units by mouth daily.  . diazepam (VALIUM) 2 MG tablet Take 1 tablet (2 mg total) by mouth every 8 (eight) hours as needed for anxiety.  Marland Kitchen EPINEPHrine 0.3 mg/0.3 mL IJ SOAJ injection Inject 0.3 mLs (0.3 mg total) into the muscle once for 1 dose.  Marland Kitchen L-Lysine 500 MG TABS Take 500 mg by mouth.  . Multiple Vitamins-Minerals (MULTIVITAMIN WITH MINERALS) tablet Take 1 tablet by mouth daily.  . mupirocin ointment (BACTROBAN) 2 % Apply to affected area TID for 7 days.  Marland Kitchen triamcinolone cream (KENALOG) 0.1 % Apply 1 application topically daily as needed.  . [DISCONTINUED] diazepam (VALIUM) 2 MG tablet Take 1 tablet (2 mg total) by mouth every 8 (eight) hours as needed for anxiety.  . [DISCONTINUED] diazepam (VALIUM) 2 MG tablet Take 1 tablet (2 mg total) by mouth every 8 (eight) hours as needed for anxiety.  . [DISCONTINUED] EPINEPHrine 0.3 mg/0.3 mL IJ SOAJ injection Inject into the muscle.  . [DISCONTINUED] hydrOXYzine (ATARAX/VISTARIL) 10 MG tablet Take 1-2 tablets (10-20 mg total) by mouth at bedtime as needed for itching.  . [DISCONTINUED] ibuprofen (  ADVIL,MOTRIN) 400 MG tablet Take 1 every 6 hours with food as needed for moderate to severe pain  . [DISCONTINUED] ipratropium  (ATROVENT) 0.03 % nasal spray Place 2 sprays into both nostrils 4 (four) times daily.  . [DISCONTINUED] lansoprazole (PREVACID) 30 MG capsule Take 1 capsule (30 mg total) by mouth daily at 12 noon.  . [DISCONTINUED] mupirocin ointment (BACTROBAN) 2 % Apply to affected area TID for 7 days.   No facility-administered encounter medications on file as of 09/27/2018.          Objective:   Physical Exam Constitutional:      Appearance: She is well-developed.  HENT:     Head: Normocephalic and atraumatic.  Cardiovascular:     Rate and Rhythm: Normal rate and regular rhythm.     Heart sounds: Normal heart sounds.  Pulmonary:     Effort: Pulmonary effort is normal.     Breath sounds: Normal breath sounds.  Skin:    General: Skin is warm and dry.     Comments: She has erythematous follicules scattered on her outer lower legs. No pustules.    Neurological:     Mental Status: She is alert and oriented to person, place, and time.  Psychiatric:        Behavior: Behavior normal.         Assessment & Plan:  Folliculitis -discussed most treatment.  Commend switch from shaving with a razor to an electric shaver this might help.  Also avoid any scratching irritation right after shaving this will really irritate the follicles.  Even close rubbing can become irritating.  Okay to treat flares with topical steroid cream.  I did go ahead and refill the mupirocin as well since that seems to help.  Discussed the importance of moisturizing really well.  Bee venom  allergy-refilled EpiPen today.  PTSD - refilled her diazepam.    Reminded her she is due for her Tdap.

## 2018-09-30 DIAGNOSIS — F4310 Post-traumatic stress disorder, unspecified: Secondary | ICD-10-CM | POA: Diagnosis not present

## 2018-10-14 DIAGNOSIS — F4310 Post-traumatic stress disorder, unspecified: Secondary | ICD-10-CM | POA: Diagnosis not present

## 2018-11-02 ENCOUNTER — Emergency Department (INDEPENDENT_AMBULATORY_CARE_PROVIDER_SITE_OTHER)
Admission: EM | Admit: 2018-11-02 | Discharge: 2018-11-02 | Disposition: A | Discharge status: Home / Self Care | Payer: Medicare Other | Source: Home / Self Care

## 2018-11-02 ENCOUNTER — Encounter: Payer: Self-pay | Admitting: Family Medicine

## 2018-11-02 ENCOUNTER — Other Ambulatory Visit: Payer: Self-pay

## 2018-11-02 ENCOUNTER — Telehealth: Payer: Self-pay

## 2018-11-02 DIAGNOSIS — J40 Bronchitis, not specified as acute or chronic: Secondary | ICD-10-CM | POA: Diagnosis not present

## 2018-11-02 MED ORDER — ALBUTEROL SULFATE HFA 108 (90 BASE) MCG/ACT IN AERS: 1.0000 | INHALATION_SPRAY | Freq: Four times a day (QID) | RESPIRATORY_TRACT | 0 refills | Status: DC | PRN

## 2018-11-02 MED ORDER — ALBUTEROL SULFATE HFA 108 (90 BASE) MCG/ACT IN AERS: 2.0000 | INHALATION_SPRAY | RESPIRATORY_TRACT | 1 refills | Status: DC | PRN

## 2018-11-02 MED ORDER — SPACER/AERO-HOLD CHAMBER BAGS MISC: 1.0000 | Freq: Four times a day (QID) | 1 refills | Status: DC | PRN

## 2018-11-02 MED ORDER — SPACER/AERO-HOLD CHAMBER BAGS MISC: 1.0000 | Freq: Once | 0 refills | Status: AC

## 2018-11-02 NOTE — ED Provider Notes (Signed)
[facility]    CSN: [id number] Arrival date & time: 11/02/18  1502     History   Chief Complaint Chief Complaint  Patient presents with  . Cough    HPI Autumn Garcia is a 58 y.o. female.   58 year old established [facility] urgent care patient comes in with cough for 2 days.     Past Medical History:  Diagnosis Date  . Anxiety   . Chronic kidney disease   . Depression   . Eating disorder   . Epilepsy ([place])   . HSV infection   . Kidney stones   . Murmur   . PTSD (post-traumatic stress disorder)   . Skin cancer of forehead    If could be a more serious cancer and will not know until after surgery  . TB (tuberculosis)    Tested positive and treated    Patient Active Problem List   Diagnosis Date Noted  . Bee allergy status 09/27/2018  . Hyperlipidemia 04/26/2015  . Herpes simplex type II infection 04/26/2014  . Dizziness and giddiness 11/13/2013  . Vitamin D deficiency 10/29/2013  . Basal cell carcinoma of skin 10/29/2013  . Ventral hernia without obstruction or gangrene 01/27/2013  . Dissociative disorder 06/03/2012  . PTSD (post-traumatic stress disorder) 11/15/2011    Class: Chronic  . 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 HOT FLASHES 09/03/2009  . ANXIETY DISORDER, GENERALIZED 06/14/2009  . KNEE PAIN 01/26/2009  . Other convulsions 01/26/2009    Past Surgical History:  Procedure Laterality Date  . CERVICAL POLYPECTOMY    . CHOLECYSTECTOMY    . KIDNEY SURGERY    . LAPAROSCOPIC NEPHRECTOMY  2010.     Dr. Angeline Slim  at [facility] for large stone  . left knee surgery    . Removal of basil cell carcinoma      OB History    Gravida  1   Para  0   Term      Preterm      AB      Living  0     SAB  0   TAB      Ectopic      Multiple      Live Births               Home Medications    Prior to Admission medications   Medication Sig Start Date End Date Taking? Authorizing  Provider  albuterol (PROVENTIL HFA;VENTOLIN HFA) 108 (90 Base) MCG/ACT inhaler Inhale 2 puffs into the lungs every 4 (four) hours as needed for wheezing or shortness of breath (cough, shortness of breath or wheezing.). 11/02/18   Robyn Haber, MD  cholecalciferol (VITAMIN D) 1000 UNITS tablet Take 2,000 Units by mouth daily.    [provider]  diazepam (VALIUM) 2 MG tablet Take 1 tablet (2 mg total) by mouth every 8 (eight) hours as needed for anxiety. 09/27/18   Hali Marry, MD  L-Lysine 500 MG TABS Take 500 mg by mouth.    [provider]  Multiple Vitamins-Minerals (MULTIVITAMIN WITH MINERALS) tablet Take 1 tablet by mouth daily.    [provider]  mupirocin ointment (BACTROBAN) 2 % Apply to affected area TID for 7 days. 09/27/18   Hali Marry, MD  Spacer/Aero-Hold Chamber Bags MISC 1 Device by Does not apply route every 6 (six) hours as needed. 11/02/18   Robyn Haber, MD  triamcinolone cream (KENALOG) 0.1 % Apply 1 application topically  daily as needed. 09/27/18   Hali Marry, MD    Family History Family History  Problem Relation Age of Onset  . Bipolar disorder Mother   . Heart attack Mother        MI in her late 16s  . Bipolar disorder Sister   . Cancer Sister        skin  . Dementia Brother   . Cancer Cousin        breast  . Hypertension Brother   . Cancer Sister        breast,brain,cervical ovarian  . Cancer Maternal Aunt        breast  . Anemia Neg Hx   . Arrhythmia Neg Hx   . Asthma Neg Hx   . Clotting disorder Neg Hx   . Fainting Neg Hx   . Heart disease Neg Hx   . Heart failure Neg Hx   . Hyperlipidemia Neg Hx     Social History Social History   Tobacco Use  . Smoking status: Never Smoker  . Smokeless tobacco: Never Used  Substance Use Topics  . Alcohol use: Yes    Alcohol/week: 0.0 standard drinks    Comment: Occasional wine  . Drug use: No     Allergies   Demerol [meperidine]; Latex;  Nickel; Ciprofloxacin; Citrus; Codeine; Erythromycin; Lactose intolerance (gi); Lidocaine; Meperidine hcl; Peanut oil; and Soy allergy   Review of Systems Review of Systems   Physical Exam Triage Vital Signs ED Triage Vitals  Enc Vitals Group     BP      Pulse      Resp      Temp      Temp src      SpO2      Weight      Height      Head Circumference      Peak Flow      Pain Score      Pain Loc      Pain Edu?      Excl. in [place]?    No data found.  Updated Vital Signs BP 131/79 (BP Location: Right Arm)   Pulse 85   Temp 98.9 F (37.2 C) (Oral)   Resp 16   Ht 5\' 3"  (1.6 m)   SpO2 98%   BMI 28.34 kg/m    Physical Exam Vitals signs and nursing note reviewed.  Constitutional:      Appearance: Normal appearance.  HENT:     Head: Normocephalic.     Right Ear: Tympanic membrane and external ear normal.     Left Ear: Tympanic membrane and external ear normal.     Nose: Nose normal.     Mouth/Throat:     Mouth: Mucous membranes are moist.  Eyes:     Conjunctiva/sclera: Conjunctivae normal.  Neck:     Musculoskeletal: Normal range of motion and neck supple.  Cardiovascular:     Rate and Rhythm: Normal rate and regular rhythm.     Heart sounds: Normal heart sounds.  Pulmonary:     Effort: Pulmonary effort is normal.     Breath sounds: Wheezing present.     Comments: Rare wheeze Musculoskeletal: Normal range of motion.  Skin:    General: Skin is warm and dry.  Neurological:     General: No focal deficit present.     Mental Status: She is alert.  Psychiatric:        Mood and Affect: Mood normal.  Thought Content: Thought content normal.      UC Treatments / Results  Labs (all labs ordered are listed, but only abnormal results are displayed) Labs Reviewed - No data to display  EKG None  Radiology No results found.  Procedures Procedures (including critical care time)  Medications Ordered in UC Medications - No data to display  Initial  Impression / Assessment and Plan / UC Course  I have reviewed the triage vital signs and the nursing notes.  Pertinent labs & imaging results that were available during my care of the patient were reviewed by me and considered in my medical decision making (see chart for details).    Final Clinical Impressions(s) / UC Diagnoses   Final diagnoses:  Bronchitis   Discharge Instructions   None    ED Prescriptions    Medication Sig Dispense Auth. Provider   albuterol (PROVENTIL HFA;VENTOLIN HFA) 108 (90 Base) MCG/ACT inhaler Inhale 2 puffs into the lungs every 4 (four) hours as needed for wheezing or shortness of breath (cough, shortness of breath or wheezing.). 1 Inhaler Robyn Haber, MD   Spacer/Aero-Hold Chamber Bags MISC 1 Device by Does not apply route every 6 (six) hours as needed. 1 each Robyn Haber, MD     Controlled Substance Prescriptions [place] Controlled Substance Registry consulted? Not Applicable   Robyn Haber, MD 11/02/18 1535

## 2018-11-02 NOTE — ED Triage Notes (Signed)
Patient reports cough for past 2 days. No OTC today. Concerned about illnesses going around.

## 2018-11-12 ENCOUNTER — Encounter: Payer: Self-pay | Admitting: Family Medicine

## 2018-11-12 ENCOUNTER — Ambulatory Visit (INDEPENDENT_AMBULATORY_CARE_PROVIDER_SITE_OTHER): Payer: Medicare Other | Admitting: Family Medicine

## 2018-11-12 VITALS — BP 121/79 | HR 75 | Ht 64.0 in

## 2018-11-12 DIAGNOSIS — E559 Vitamin D deficiency, unspecified: Secondary | ICD-10-CM

## 2018-11-12 DIAGNOSIS — R7309 Other abnormal glucose: Secondary | ICD-10-CM

## 2018-11-12 DIAGNOSIS — R531 Weakness: Secondary | ICD-10-CM

## 2018-11-12 DIAGNOSIS — R5383 Other fatigue: Secondary | ICD-10-CM

## 2018-11-12 DIAGNOSIS — N912 Amenorrhea, unspecified: Secondary | ICD-10-CM | POA: Diagnosis not present

## 2018-11-12 DIAGNOSIS — R05 Cough: Secondary | ICD-10-CM

## 2018-11-12 DIAGNOSIS — R059 Cough, unspecified: Secondary | ICD-10-CM

## 2018-11-12 NOTE — Progress Notes (Signed)
Subjective:    CC:   HPI:  She was diagnosed with bronchitis around 11/02/2018.  Says she is starting to feel some better.  She said she had a cold that started right around Christmas and then by early February developed into bronchitis.  They gave her an inhaler which she says she never really used.  She says last night was the first night that she was able to sleep flat she says her last fever was yesterday.  She says she is been having severe headaches and lots of pressure.  But again is feeling a little bit better today.  She is mostly here today to discuss her fatigue.  She c/ of fatigue for about the last 1.5 years.  She feels like she just ran a marathon al the time.   She does not necessarily feel short of breath she just feels extremely tired and weak like everything is just a big exertion of energy.  She denies any fever or chills prior to her more recent illness.  No night sweats.  Feels like she hydrates well and says she drinks about 4 L a day.  She denies any abdominal pain.  She is now postmenopausal and has been since she had a nephrectomy.  We do not have any lab work on her at all so I am unclear what her baseline levels for renal and liver function are.  Denies any blood in the urine or stool.  No recent bowel changes.  She also reports that sometimes she has night terrors.  She will wake up feeling anxious and panicky.  Her heart will race and she feels like she has to move her limbs uncontrollably.  In fact she was referred to neurology and saw Dr. Berdine Addison who worked her up for seizure activity but was unable to find anything specific.  Sometimes she will wake up and just feel like she is paralyzed and cannot move temporarily.   Also,  last time she was here I diagnosed her with folliculitis.  We discussed conservative measures.  She did use the mupirocin ointment previously that had helped.  At the time I refill the mupirocin as well as gave her a prescription for topical steroid cream  to use when the skin became irritated and itchy to keep her from rubbing at it.  She says that the cream was not helpful.  Past medical history, Surgical history, Family history not pertinant except as noted below, Social history, Allergies, and medications have been entered into the medical record, reviewed, and corrections made.   Review of Systems: No fevers, chills, night sweats, weight loss, chest pain, or shortness of breath.   Objective:    General: Well Developed, well nourished, and in no acute distress.  Neuro: Alert and oriented x3, extra-ocular muscles intact, sensation grossly intact.  HEENT: Normocephalic, atraumatic, pharynx is clear, TMs and canals are clear.  No significant cervical lymphadenopathy. Skin: Warm and dry, no rashes. Cardiac: Regular rate and rhythm, no murmurs rubs or gallops, no lower extremity edema.  Respiratory: Clear to auscultation bilaterally. Not using accessory muscles, speaking in full sentences. Abd: soft, Nontender on exam.   Impression and Recommendations:   Fatigue -unclear etiology.  Will start with ruling out basics including renal disease, liver disease, anemia, thyroid disease etc.  Also consider chronic fatigue syndrome.  Also check an inflammatory marker. Could consider checking a CK.  Because she is postmenopausal since having a nephrectomy we will also check her hormone levels.  Also  check for nutritional deficiencies such as iron folate and B12.  Colon cancer screening-she never received her Cologuard kit but her husband did.  We will refax order form.  Bronchitis-it sounds like she is actually gotten a little bit better in the last 24 hours.  If she does not continue to improve back to her baseline over the next week then please give [place] a call back.  Prior history of vitamin D deficiency so we will recheck vitamin D levels.  Wants to be screened for diabetes as well.   Folliculitis - steroid not helpful.

## 2018-11-25 DIAGNOSIS — F4310 Post-traumatic stress disorder, unspecified: Secondary | ICD-10-CM | POA: Diagnosis not present

## 2018-12-10 ENCOUNTER — Ambulatory Visit (INDEPENDENT_AMBULATORY_CARE_PROVIDER_SITE_OTHER): Payer: Medicare Other | Admitting: Family Medicine

## 2018-12-10 ENCOUNTER — Encounter: Payer: Self-pay | Admitting: Family Medicine

## 2018-12-10 VITALS — BP 122/67 | HR 58 | Ht 64.0 in

## 2018-12-10 DIAGNOSIS — E559 Vitamin D deficiency, unspecified: Secondary | ICD-10-CM | POA: Diagnosis not present

## 2018-12-10 DIAGNOSIS — R05 Cough: Secondary | ICD-10-CM | POA: Diagnosis not present

## 2018-12-10 DIAGNOSIS — L739 Follicular disorder, unspecified: Secondary | ICD-10-CM

## 2018-12-10 DIAGNOSIS — D539 Nutritional anemia, unspecified: Secondary | ICD-10-CM

## 2018-12-10 DIAGNOSIS — R232 Flushing: Secondary | ICD-10-CM | POA: Diagnosis not present

## 2018-12-10 DIAGNOSIS — N912 Amenorrhea, unspecified: Secondary | ICD-10-CM

## 2018-12-10 DIAGNOSIS — R531 Weakness: Secondary | ICD-10-CM | POA: Diagnosis not present

## 2018-12-10 DIAGNOSIS — R5383 Other fatigue: Secondary | ICD-10-CM | POA: Diagnosis not present

## 2018-12-10 DIAGNOSIS — Z8639 Personal history of other endocrine, nutritional and metabolic disease: Secondary | ICD-10-CM | POA: Diagnosis not present

## 2018-12-10 DIAGNOSIS — R7309 Other abnormal glucose: Secondary | ICD-10-CM

## 2018-12-10 DIAGNOSIS — R059 Cough, unspecified: Secondary | ICD-10-CM

## 2018-12-10 MED ORDER — FLUCONAZOLE 150 MG PO TABS: 150.0000 mg | ORAL_TABLET | Freq: Once | ORAL | 0 refills | Status: DC

## 2018-12-10 MED ORDER — FLUCONAZOLE 150 MG PO TABS: 150.0000 mg | ORAL_TABLET | Freq: Every day | ORAL | 0 refills | Status: DC

## 2018-12-10 NOTE — Patient Instructions (Signed)
Folliculitis can sometimes be caused by a fungus so recommend try oral Diflucan 1 daily for 2 weeks and see if this helps.  Encouraged her to go ahead and make an appointment with your dermatologist though.

## 2018-12-10 NOTE — Progress Notes (Signed)
Subjective:    CC:   HPI: 58 yo female is here today to follow-up for fatigue.  Please see previous notes.  Today she is actually feeling a little bit better but it waxes and wanes in general.  She reports that she did try to go for her lab work but was told that the vitamins were not covered.  So she wanted to wait till her appointment so that we could try to put some new codes on them to get them covered by her insurance.  Feels so weak at times that even just sitting up is a struggle for her.  She also wanted to ask about flushing and hotness of the palms or hands.  She says she noticed it on and off since she went into menopause 8 to 9 years ago.  She does not really get overall hot flashes but wonders if it could be related.  She also wants me to look at the rash on her lower legs again.  She has a significant inflammation folliculitis on her right lower leg.  She says the steroid tends to make it worse in the mupirocin ointment does not really seem to be helping.  She has done some salt wraps and calamine lotion and that does seem to help some.  She says she does avoid scratching at it.  She says she made the right leg worse because she took some Hibiclens and took a wash rag and scrub her lower leg.  Thinking that it was an infection deep into the pores.   Past medical history, Surgical history, Family history not pertinant except as noted below, Social history, Allergies, and medications have been entered into the medical record, reviewed, and corrections made.   Review of Systems: No fevers, chills, night sweats, weight loss, chest pain, or shortness of breath.   Objective:    General: Well Developed, well nourished, and in no acute distress.  Neuro: Alert and oriented x3, extra-ocular muscles intact, sensation grossly intact.  HEENT: Normocephalic, atraumatic  Skin: Warm and dry.  She has significant darkly pigmented erythema around the follicles clustered over the shins and going  around the posterior ankle.  No pustules. Cardiac: Regular rate and rhythm, no murmurs rubs or gallops, no lower extremity edema.  Respiratory: Clear to auscultation bilaterally. Not using accessory muscles, speaking in full sentences.   Impression and Recommendations:    Fatigue-I did reorder her labs and included some new diagnoses that should help get them covered.  She did remind that she made that she had had a prior high history of vitamin D deficiency.  She is feeling a little bit better today so I want to see her back once her receive all the lab work.  Folliculitis-if the mupirocin ointment is not helping and the steroid does not help reduce the redness and itching then we can certainly try treating for fungal element.  Will treat with oral Diflucan daily for 2 weeks.  Also encouraged her to maybe schedule an appoint with her dermatologist.  Discussed with her that the biggest treatment for folliculitis is avoiding irritation and rubbing of the skin.  She does continue to shave her legs.  Hand flushing-unclear etiology but I do think that it is benign and gave her reassurance.  Clear if related to menopause or not.

## 2018-12-10 NOTE — Addendum Note (Signed)
Addended by: Teddy Spike on: 12/10/2018 04:52 PM   Modules accepted: Orders

## 2018-12-31 ENCOUNTER — Ambulatory Visit: Payer: Medicare Other | Admitting: Family Medicine

## 2019-01-28 ENCOUNTER — Ambulatory Visit: Payer: Medicare Other | Admitting: Family Medicine

## 2019-06-11 ENCOUNTER — Telehealth: Payer: Self-pay | Admitting: Physician Assistant

## 2019-06-11 ENCOUNTER — Encounter: Payer: Self-pay | Admitting: Physician Assistant

## 2019-06-11 ENCOUNTER — Ambulatory Visit (INDEPENDENT_AMBULATORY_CARE_PROVIDER_SITE_OTHER): Payer: Medicare Other | Admitting: Physician Assistant

## 2019-06-11 VITALS — Ht 64.0 in

## 2019-06-11 DIAGNOSIS — L739 Follicular disorder, unspecified: Secondary | ICD-10-CM | POA: Diagnosis not present

## 2019-06-11 DIAGNOSIS — H01004 Unspecified blepharitis left upper eyelid: Secondary | ICD-10-CM

## 2019-06-11 DIAGNOSIS — H01001 Unspecified blepharitis right upper eyelid: Secondary | ICD-10-CM | POA: Diagnosis not present

## 2019-06-11 MED ORDER — TERBINAFINE HCL 1 % EX CREA: TOPICAL_CREAM | CUTANEOUS | 1 refills | Status: DC

## 2019-06-11 MED ORDER — BACITRA-NEOMYCIN-POLYMYXIN-HC 1 % OP OINT: 1.0000 "application " | TOPICAL_OINTMENT | Freq: Every day | OPHTHALMIC | 0 refills | Status: DC

## 2019-06-11 NOTE — Telephone Encounter (Signed)
Call pt: I found a ointment that has low dose cortisone and antibiotic. You do not need to use triamcinolone cream. It has everything you need in it.

## 2019-06-11 NOTE — Progress Notes (Deleted)
Started yesterday: Eyes - itching, swelling, red, yellow crusting  Has had blepharitis in the past and when she scratches her eyes they have this reaction Sweating outside yesterday and rubbed eyes and reaction started Used some vitamin e oil last night, no other treatment

## 2019-06-11 NOTE — Progress Notes (Signed)
Patient ID: Autumn Garcia, female   DOB: 1960/11/22, 58 y.o.   MRN: [id number] .Marland KitchenVirtual Visit via Video Note  I connected with Autumn Garcia on 06/16/19 at  1:20 PM EDT by a video enabled telemedicine application and verified that I am speaking with the correct person using two identifiers.  Location: Patient: home Provider: clinic   I discussed the limitations of evaluation and management by telemedicine and the availability of in person appointments. The patient expressed understanding and agreed to proceed.  History of Present Illness: Pt is a 58 yo female with multiple psychiatric conditions who calls into the clinic with bilateral red, itchy eyelids and lashes. Per patient she has a hx of this. She has had bactroban before and helped. She is very careful and uses disposable make up and washes eyelids regularly. This episode started yesterday. Eyelid is very itchy, swollen, red and "crusty". She sweated a lot yesterday and rubbed her eyes a lot. She used some vitamin E cream but did not help. She read about demodex mites and thinks that might be her problem. She feels like she is seeing mites. No fever, chills, body aches, sinus pressure, or vision changes.   Hx if folliculitis of her legs. She has been using some antifungal treatment and working well OTC. She wonders if she can get prescription for it.   .. Active Ambulatory Problems    Diagnosis Date Noted  . ANXIETY DISORDER, GENERALIZED 06/14/2009  . HOT FLASHES 09/03/2009  . KNEE PAIN 01/26/2009  . Other convulsions 01/26/2009  . 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 PTSD (post-traumatic stress disorder) 11/15/2011  . Dissociative disorder 06/03/2012  . Ventral hernia without obstruction or gangrene 01/27/2013  . Vitamin D deficiency 10/29/2013  . Basal cell carcinoma of skin 10/29/2013  . Dizziness and giddiness 11/13/2013  . Herpes simplex type II infection 04/26/2014  . Hyperlipidemia  04/26/2015  . Bee allergy status 09/27/2018   Resolved Ambulatory Problems    Diagnosis Date Noted  . CANDIDIASIS, ORAL 10/15/2009  . Acute pharyngitis 05/15/2010  . URI 10/15/2009  . Acute cystitis 01/26/2010  . CELLULITIS, RIGHT LEG 03/18/2010  . SKIN RASH 01/26/2009  . Skin cancer of forehead 11/15/2011  . Chest pain 09/08/2015  . Dyspnea 09/08/2015   Past Medical History:  Diagnosis Date  . Anxiety   . Chronic kidney disease   . Depression   . Eating disorder   . Epilepsy ([place])   . HSV infection   . Kidney stones   . Murmur   . TB (tuberculosis)     Reviewed med, allergy, problem list.     Observations/Objective: No acute distress. No labored breathing.  She was pretty fixed on this being "mites" in her eyelashes.  I did not see any evidence of mites on video.  Upper bilateral eyelids were red and swollen. Conjunctiva not injected. No active eye discharge.   .. Today's Vitals   06/11/19 1157  Height: 5\' 4"  (1.626 m)   Body mass index is 27.46 kg/m.   Assessment and Plan: Marland KitchenMarland KitchenPandalene was seen today for eye problem.  Diagnoses and all orders for this visit:  Blepharitis of upper eyelids of both eyes, unspecified type -     bacitracin-neomycin-polymyxin-hydrocortisone (CORTISPORIN) 1 % ophthalmic ointment; Place 1 application into both eyes at bedtime. For 4 weeks. -     Ambulatory referral to Dermatology  Folliculitis -     terbinafine (LAMISIL) 1 % cream; Apply to affected area BID until rash  gone, then apply 2 more days.   Treated with cortisporin for inflammation and infection. Will make dermatology appt for inspection of "mites". Concern she is becoming fixated on the mites but this was virtual visit and could not really see well. Continue to use warm compresses.    Given lamisil for her legs to continue to treat folliculitis as needed. Per patient rash responds really well to anti-fungals. Suggested some benzyol peroxide washes as well to help  cleanse the skin.   Follow Up Instructions:    I discussed the assessment and treatment plan with the patient. The patient was provided an opportunity to ask questions and all were answered. The patient agreed with the plan and demonstrated an understanding of the instructions.   The patient was advised to call back or seek an in-person evaluation if the symptoms worsen or if the condition fails to improve as anticipated.   Iran Planas, PA-C

## 2019-06-11 NOTE — Telephone Encounter (Signed)
Patient made aware.

## 2019-06-20 ENCOUNTER — Telehealth: Payer: Self-pay

## 2019-06-20 NOTE — Telephone Encounter (Signed)
It only comes in 1% which is OTC. She can mix 1:1 with Eucerin cream

## 2019-06-20 NOTE — Telephone Encounter (Signed)
Patient called stating she had an RX sent in for Lamisil and it was just for generic OTC and patient wanted to know if she could have something stronger called in. Please advise

## 2019-06-23 DIAGNOSIS — H04123 Dry eye syndrome of bilateral lacrimal glands: Secondary | ICD-10-CM | POA: Diagnosis not present

## 2019-06-23 DIAGNOSIS — H0102B Squamous blepharitis left eye, upper and lower eyelids: Secondary | ICD-10-CM | POA: Diagnosis not present

## 2019-06-23 DIAGNOSIS — H16223 Keratoconjunctivitis sicca, not specified as Sjogren's, bilateral: Secondary | ICD-10-CM | POA: Diagnosis not present

## 2019-06-23 DIAGNOSIS — H0102A Squamous blepharitis right eye, upper and lower eyelids: Secondary | ICD-10-CM | POA: Diagnosis not present

## 2019-06-23 NOTE — Telephone Encounter (Signed)
Attempted to contacted pt regarding provider's update for rx. No answer, left a vm msg to return call back to the office. Direct call back info provided.

## 2019-06-24 MED ORDER — NYSTATIN 100000 UNIT/GM EX CREA: 1.0000 "application " | TOPICAL_CREAM | Freq: Two times a day (BID) | CUTANEOUS | 0 refills | Status: DC

## 2019-06-24 NOTE — Telephone Encounter (Signed)
Patient advised.

## 2019-06-24 NOTE — Telephone Encounter (Signed)
rx sent to pharmacy

## 2019-06-24 NOTE — Telephone Encounter (Signed)
Autumn Garcia would like a prescription for a different anti-fungal cream, other than Lamisil.

## 2019-11-25 DIAGNOSIS — L249 Irritant contact dermatitis, unspecified cause: Secondary | ICD-10-CM | POA: Diagnosis not present

## 2019-11-25 DIAGNOSIS — L309 Dermatitis, unspecified: Secondary | ICD-10-CM | POA: Diagnosis not present

## 2019-12-30 DIAGNOSIS — L309 Dermatitis, unspecified: Secondary | ICD-10-CM | POA: Diagnosis not present

## 2019-12-30 DIAGNOSIS — L249 Irritant contact dermatitis, unspecified cause: Secondary | ICD-10-CM | POA: Diagnosis not present

## 2020-03-02 ENCOUNTER — Ambulatory Visit (INDEPENDENT_AMBULATORY_CARE_PROVIDER_SITE_OTHER): Payer: Medicare Other | Admitting: Family Medicine

## 2020-03-02 ENCOUNTER — Encounter: Payer: Self-pay | Admitting: Family Medicine

## 2020-03-02 VITALS — BP 140/70 | HR 64 | Ht 64.0 in

## 2020-03-02 DIAGNOSIS — Z91030 Bee allergy status: Secondary | ICD-10-CM

## 2020-03-02 DIAGNOSIS — F4310 Post-traumatic stress disorder, unspecified: Secondary | ICD-10-CM

## 2020-03-02 DIAGNOSIS — N952 Postmenopausal atrophic vaginitis: Secondary | ICD-10-CM

## 2020-03-02 DIAGNOSIS — F411 Generalized anxiety disorder: Secondary | ICD-10-CM

## 2020-03-02 DIAGNOSIS — R102 Pelvic and perineal pain: Secondary | ICD-10-CM | POA: Diagnosis not present

## 2020-03-02 DIAGNOSIS — N760 Acute vaginitis: Secondary | ICD-10-CM

## 2020-03-02 LAB — WET PREP FOR TRICH, YEAST, CLUE
MICRO NUMBER:: 10538313
Specimen Quality: ADEQUATE

## 2020-03-02 MED ORDER — DIAZEPAM 2 MG PO TABS: 2.0000 mg | ORAL_TABLET | Freq: Three times a day (TID) | ORAL | 1 refills | Status: DC | PRN

## 2020-03-02 MED ORDER — EPINEPHRINE 0.3 MG/0.3ML IJ SOAJ: INTRAMUSCULAR | 99 refills | Status: DC

## 2020-03-02 MED ORDER — ESTRADIOL 0.1 MG/GM VA CREA
1.0000 | TOPICAL_CREAM | Freq: Every day | VAGINAL | 3 refills | Status: DC
Start: 2020-03-02 — End: 2020-03-29

## 2020-03-02 NOTE — Progress Notes (Signed)
Acute Office Visit  Subjective:    Patient ID: Autumn Garcia, female    DOB: October 15, 1960, 59 y.o.   MRN: [id number]  Chief Complaint  Patient presents with  . Vaginal Discharge    HPI Patient is in today for vaginal pain/discomfort.  She is postmenopausal and has been having a lot of dryness and irritation she has been mostly using some Replens.  She said that more recently she had been using some medical dilators to help with vaginal narrowing and atrophy and noticed a streak of blood.  Not much later she actually went to the bathroom to urinate and actually passed a fairly large dark looking clot.  She has used a couple of times since then with vinegar water to make sure that there was no other debris or discharge etc. and has not noted anything else since then.  She had previously discussed possibly using an estrogen cream with her OB/GYN but had opted out of it because of the hormones she was worried about the potential effects.  She also has a lesion on the inside of the bottom lower lip that has been there for couple months not painful or bothersome but she just noticed it to me to take a look at it today to see if it looked concerning.  She is also requesting a refill on her diazepam.  Past Medical History:  Diagnosis Date  . Anxiety   . Chronic kidney disease   . Depression   . Eating disorder   . Epilepsy ([place])   . HSV infection   . Kidney stones   . Murmur   . PTSD (post-traumatic stress disorder)   . Skin cancer of forehead    If could be a more serious cancer and will not know until after surgery  . TB (tuberculosis)    Tested positive and treated    Past Surgical History:  Procedure Laterality Date  . CERVICAL POLYPECTOMY    . CHOLECYSTECTOMY    . KIDNEY SURGERY    . LAPAROSCOPIC NEPHRECTOMY  2010.     Dr. Angeline Slim  at [facility] for large stone  . left knee surgery    . Removal of basil cell carcinoma      Family History  Problem Relation Age of Onset   . Bipolar disorder Mother   . Heart attack Mother        MI in her late 49s  . Bipolar disorder Sister   . Cancer Sister        skin  . Dementia Brother   . Cancer Cousin        breast  . Hypertension Brother   . Cancer Sister        breast,brain,cervical ovarian  . Cancer Maternal Aunt        breast  . Anemia Neg Hx   . Arrhythmia Neg Hx   . Asthma Neg Hx   . Clotting disorder Neg Hx   . Fainting Neg Hx   . Heart disease Neg Hx   . Heart failure Neg Hx   . Hyperlipidemia Neg Hx     Social History   Socioeconomic History  . Marital status: Married    Spouse name: [name]  . Number of children: 0  . Years of education: Not on file  . Highest education level: Not on file  Occupational History  . Occupation: disability    Employer: UNEMPLOYED  Tobacco Use  . Smoking status: Never Smoker  . Smokeless tobacco:  Never Used  Substance and Sexual Activity  . Alcohol use: Yes    Alcohol/week: 0.0 standard drinks    Comment: Occasional wine  . Drug use: No  . Sexual activity: Yes    Partners: Male  Other Topics Concern  . Not on file  Social History Narrative   1 caffeinated drink per day. Does yoga for  60 minutes 3 days per week.   Social Determinants of Health   Financial Resource Strain:   . Difficulty of Paying Living Expenses:   Food Insecurity:   . Worried About Charity fundraiser in the Last Year:   . Arboriculturist in the Last Year:   Transportation Needs:   . Film/video editor (Medical):   Marland Kitchen Lack of Transportation (Non-Medical):   Physical Activity:   . Days of Exercise per Week:   . Minutes of Exercise per Session:   Stress:   . Feeling of Stress :   Social Connections:   . Frequency of Communication with Friends and Family:   . Frequency of Social Gatherings with Friends and Family:   . Attends Religious Services:   . Active Member of Clubs or Organizations:   . Attends Archivist Meetings:   Marland Kitchen Marital Status:   Intimate  Partner Violence:   . Fear of Current or Ex-Partner:   . Emotionally Abused:   Marland Kitchen Physically Abused:   . Sexually Abused:     Outpatient Medications Prior to Visit  Medication Sig Dispense Refill  . L-Lysine 500 MG TABS Take 500 mg by mouth.    . bacitracin-neomycin-polymyxin-hydrocortisone (CORTISPORIN) 1 % ophthalmic ointment Place 1 application into both eyes at bedtime. For 4 weeks. 3.5 g 0  . diazepam (VALIUM) 2 MG tablet Take 1 tablet (2 mg total) by mouth every 8 (eight) hours as needed for anxiety. 90 tablet 1  . EPINEPHrine 0.3 mg/0.3 mL IJ SOAJ injection USE AS DIRECTED AND AS NEEDED FOR SEVERE ALLERGIC REACTION    . nystatin cream (MYCOSTATIN) Apply 1 application topically 2 (two) times daily. 30 g 0  . terbinafine (LAMISIL) 1 % cream Apply to affected area BID until rash gone, then apply 2 more days. 80 g 1   No facility-administered medications prior to visit.    Allergies  Allergen Reactions  . Demerol [Meperidine] Nausea And Vomiting, Nausea Only and Other (See Comments)    seizures  . Latex Rash  . Nickel     infections  . Ciprofloxacin     REACTION: sucidal  . Citrus     Geographic tongue  . Codeine     REACTION: Halluncination  . Erythromycin     REACTION: Vomiting  . Lactose Intolerance (Gi)     GI upset  . Lidocaine Other (See Comments)    Pt unsure if allergic. Last reaction at the dentist and had a seizure, but could have been injected into a vein  . Meperidine Hcl     REACTION: Muscle weakness, vomiting  . Peanut Oil     Other reaction(s): Other (See Comments) ORAL ULCERS  . Soy Allergy     Other reaction(s): Other (See Comments) MOUTH ULCER    Review of Systems     Objective:    Physical Exam Vitals reviewed.  Constitutional:      Appearance: She is well-developed.  HENT:     Head: Normocephalic and atraumatic.  Eyes:     Conjunctiva/sclera: Conjunctivae normal.  Cardiovascular:     Rate  and Rhythm: Normal rate.  Pulmonary:      Effort: Pulmonary effort is normal.  Skin:    General: Skin is dry.     Coloration: Skin is not pale.     Comments: She did have what was look like a blood vessel on end on the inner lower lip to the left side.  No other ulcerative or hypertrophic lesion noted.  Neurological:     Mental Status: She is alert and oriented to person, place, and time.  Psychiatric:        Behavior: Behavior normal.     BP 140/70   Pulse 64   Ht 5\' 4"  (1.626 m)   SpO2 99%   BMI 27.46 kg/m  Wt Readings from Last 3 Encounters:  03/04/18 160 lb (72.6 kg)  07/15/16 140 lb (63.5 kg)  06/29/16 140 lb (63.5 kg)    Health Maintenance Due  Topic Date Due  . MAMMOGRAM  07/23/2016  . TETANUS/TDAP  10/02/2018    There are no preventive care reminders to display for this patient.   Lab Results  Component Value Date   TSH 1.576 05/17/2015   Lab Results  Component Value Date   WBC 6.0 05/17/2015   HGB 14.0 05/17/2015   HCT 42.5 05/17/2015   MCV 90.4 05/17/2015   PLT 260 05/17/2015   Lab Results  Component Value Date   NA 138 05/17/2015   K 3.7 05/17/2015   CO2 24 05/17/2015   GLUCOSE 95 05/17/2015   BUN 11 05/17/2015   CREATININE 0.88 05/17/2015   BILITOT 0.5 05/17/2015   ALKPHOS 89 05/17/2015   AST 26 05/17/2015   ALT 26 05/17/2015   PROT 7.5 05/17/2015   ALBUMIN 4.5 05/17/2015   CALCIUM 9.5 05/17/2015   Lab Results  Component Value Date   CHOL 220 (H) 05/17/2015   Lab Results  Component Value Date   HDL 73 05/17/2015   Lab Results  Component Value Date   LDLCALC 132 (H) 05/17/2015   Lab Results  Component Value Date   TRIG 77 05/17/2015   Lab Results  Component Value Date   CHOLHDL 3.0 05/17/2015   No results found for: HGBA1C     Assessment & Plan:   Problem List Items Addressed This Visit      Other   PTSD (post-traumatic stress disorder)   Relevant Medications   diazepam (VALIUM) 2 MG tablet   Bee allergy status    Refilled her epinephrine pen today.       ANXIETY DISORDER, GENERALIZED   Relevant Medications   diazepam (VALIUM) 2 MG tablet    Other Visit Diagnoses    Acute vaginitis    -  Primary   Relevant Orders   WET PREP FOR [place], YEAST, CLUE (Completed)   Vaginal atrophy       Relevant Orders   AMB referral to rehabilitation   Pelvic pain       Relevant Orders   AMB referral to rehabilitation     Acute vaginitis-we will evaluate for possible BV etc.  Though I suspect that she may have actually had some mild trauma or tear that caused a little bit of bleeding and then formed a clot which she then passed based on her description.  Because of significant vaginal atrophy we discussed getting in for her Pap smear this year with her OB/GYN she already has an appointment scheduled in July.  We also discussed maybe even rehab for pelvic therapy.  Is will  work on placing that as well she is interested in doing that.  We also discussed going back and considering the estrogen cream.  She said she is willing to consider it so I did go ahead and print a prescription per her request today.  Anxiety/PTSD-did refill her diazepam to use sparingly.  Meds ordered this encounter  Medications  . estradiol (ESTRACE VAGINAL) 0.1 MG/GM vaginal cream    Sig: Place 1 Applicatorful vaginally at bedtime. After 2 weeks decrease to twice a wee    Dispense:  85 g    Refill:  3  . DISCONTD: diazepam (VALIUM) 2 MG tablet    Sig: Take 1 tablet (2 mg total) by mouth every 8 (eight) hours as needed for anxiety.    Dispense:  90 tablet    Refill:  1  . EPINEPHrine 0.3 mg/0.3 mL IJ SOAJ injection    Sig: USE AS DIRECTED AND AS NEEDED FOR SEVERE ALLERGIC REACTION    Dispense:  2 each    Refill:  prn    Please dispense generic  . diazepam (VALIUM) 2 MG tablet    Sig: Take 1 tablet (2 mg total) by mouth every 8 (eight) hours as needed for anxiety.    Dispense:  90 tablet    Refill:  0     Beatrice Lecher, MD

## 2020-03-03 ENCOUNTER — Encounter: Payer: Self-pay | Admitting: Family Medicine

## 2020-03-03 MED ORDER — DIAZEPAM 2 MG PO TABS: 2.0000 mg | ORAL_TABLET | Freq: Three times a day (TID) | ORAL | 0 refills | Status: DC | PRN

## 2020-03-03 NOTE — Assessment & Plan Note (Signed)
Refilled her epinephrine pen today.

## 2020-03-29 ENCOUNTER — Encounter: Payer: Self-pay | Admitting: Obstetrics & Gynecology

## 2020-03-29 ENCOUNTER — Other Ambulatory Visit: Payer: Self-pay

## 2020-03-29 ENCOUNTER — Ambulatory Visit (INDEPENDENT_AMBULATORY_CARE_PROVIDER_SITE_OTHER): Payer: Medicare Other | Admitting: Obstetrics & Gynecology

## 2020-03-29 ENCOUNTER — Other Ambulatory Visit (HOSPITAL_COMMUNITY)
Admission: RE | Admit: 2020-03-29 | Discharge: 2020-03-29 | Disposition: A | Discharge status: Home / Self Care | Payer: Medicare Other | Source: Ambulatory Visit | Attending: Obstetrics & Gynecology | Admitting: Obstetrics & Gynecology

## 2020-03-29 VITALS — BP 144/85 | HR 69 | Ht 64.0 in

## 2020-03-29 DIAGNOSIS — N94819 Vulvodynia, unspecified: Secondary | ICD-10-CM | POA: Diagnosis not present

## 2020-03-29 DIAGNOSIS — N898 Other specified noninflammatory disorders of vagina: Secondary | ICD-10-CM | POA: Insufficient documentation

## 2020-03-29 MED ORDER — INTRAROSA 6.5 MG VA INST: VAGINAL_INSERT | VAGINAL | 6 refills | Status: DC

## 2020-03-29 NOTE — Progress Notes (Signed)
Pt declines having her weight taken due to eating disorder

## 2020-03-29 NOTE — Progress Notes (Signed)
° °  Subjective:    Patient ID: Autumn Garcia, female    DOB: 01-22-1961, 59 y.o.   MRN: [id number]  HPI  59 yo female presents for 5+ year history of vaginal dryness, pain and burning.  Pt has tried vaginal dilators and a vibrator.  A few weeks ago when removing the dilator she felt it tore her vagina possibly and she saw a small speck of blood or clot.  Pt has been offered estrogen cream in the past by another gynecologist in [place] and declined.  She is interested in intrarosa.  She is on a social media group for vaginal pain and is also interested in pelvic PT.  Pt is not able to be sexually active at this time.    Review of Systems  Constitutional: Negative.   Gastrointestinal: Positive for abdominal pain.  Genitourinary: Positive for dyspareunia, pelvic pain, vaginal bleeding and vaginal pain. Negative for genital sores and vaginal discharge.  Neurological: Positive for seizures.  Psychiatric/Behavioral: Negative.        Objective:   Physical Exam Vitals reviewed.  Constitutional:      General: She is not in acute distress.    Appearance: She is well-developed.  HENT:     Head: Normocephalic and atraumatic.  Eyes:     Conjunctiva/sclera: Conjunctivae normal.  Cardiovascular:     Rate and Rhythm: Normal rate.  Pulmonary:     Effort: Pulmonary effort is normal.  Genitourinary:    Comments: Tanner V Clitoris, non painful, normal Labia majora:  No lesion, no pain Labia minora:  No lesion, no pain Skenes and Vestibular glands painful bilaterally,  Redness near introitus between 2-5 o'clock Incrased tone of perineal body Incrasd tone bilateral vaginal walls Cervix:  Small, no lesion Vagina:  Atropic but not overly inflames Small amount of discharge Skin:    General: Skin is warm and dry.  Neurological:     Mental Status: She is alert and oriented to person, place, and time.    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  Intrarosa inserts 2.  Pelvic PT 3.  aptima for BV and yeast 4.  Needs annual exam and mammogram.   5.  RTC 8-10 weeks  35 minutes spent with patient during history, exam, counseling and documentation.

## 2020-03-31 LAB — CERVICOVAGINAL ANCILLARY ONLY
Bacterial Vaginitis (gardnerella): NEGATIVE
Candida Glabrata: NEGATIVE
Candida Vaginitis: NEGATIVE
Chlamydia: NEGATIVE
Comment: NEGATIVE
Comment: NEGATIVE
Comment: NEGATIVE
Comment: NEGATIVE
Comment: NEGATIVE
Comment: NORMAL
Neisseria Gonorrhea: NEGATIVE
Trichomonas: NEGATIVE

## 2020-04-02 ENCOUNTER — Telehealth: Payer: Self-pay | Admitting: Family Medicine

## 2020-04-02 DIAGNOSIS — Z1239 Encounter for other screening for malignant neoplasm of breast: Secondary | ICD-10-CM

## 2020-04-02 DIAGNOSIS — Z1231 Encounter for screening mammogram for malignant neoplasm of breast: Secondary | ICD-10-CM

## 2020-04-02 NOTE — Telephone Encounter (Signed)
PT is requesting a 3-D mammogram to be ordered

## 2020-04-02 NOTE — Telephone Encounter (Signed)
Mammogram ordered

## 2020-04-16 ENCOUNTER — Encounter: Payer: Self-pay | Admitting: Podiatry

## 2020-04-16 ENCOUNTER — Ambulatory Visit (INDEPENDENT_AMBULATORY_CARE_PROVIDER_SITE_OTHER): Payer: Medicare Other

## 2020-04-16 ENCOUNTER — Other Ambulatory Visit: Payer: Self-pay

## 2020-04-16 ENCOUNTER — Telehealth: Payer: Self-pay | Admitting: Podiatry

## 2020-04-16 ENCOUNTER — Ambulatory Visit (INDEPENDENT_AMBULATORY_CARE_PROVIDER_SITE_OTHER): Payer: Medicare Other | Admitting: Podiatry

## 2020-04-16 VITALS — BP 126/73 | HR 79 | Temp 97.6°F | Resp 16

## 2020-04-16 DIAGNOSIS — S99929S Unspecified injury of unspecified foot, sequela: Secondary | ICD-10-CM

## 2020-04-16 DIAGNOSIS — M79674 Pain in right toe(s): Secondary | ICD-10-CM

## 2020-04-16 DIAGNOSIS — M79672 Pain in left foot: Secondary | ICD-10-CM

## 2020-04-16 DIAGNOSIS — M79671 Pain in right foot: Secondary | ICD-10-CM

## 2020-04-16 DIAGNOSIS — M7732 Calcaneal spur, left foot: Secondary | ICD-10-CM | POA: Diagnosis not present

## 2020-04-16 DIAGNOSIS — M79675 Pain in left toe(s): Secondary | ICD-10-CM

## 2020-04-16 DIAGNOSIS — M7730 Calcaneal spur, unspecified foot: Secondary | ICD-10-CM | POA: Diagnosis not present

## 2020-04-16 DIAGNOSIS — M1288 Other specific arthropathies, not elsewhere classified, other specified site: Secondary | ICD-10-CM | POA: Diagnosis not present

## 2020-04-16 DIAGNOSIS — M7731 Calcaneal spur, right foot: Secondary | ICD-10-CM | POA: Diagnosis not present

## 2020-04-16 DIAGNOSIS — D3610 Benign neoplasm of peripheral nerves and autonomic nervous system, unspecified: Secondary | ICD-10-CM

## 2020-04-16 DIAGNOSIS — M722 Plantar fascial fibromatosis: Secondary | ICD-10-CM

## 2020-04-16 IMAGING — DX DG FOOT COMPLETE 3+V*L*
3 series · 3 of 3 positions shown · non-contrast
Comparison: None.

CLINICAL DATA: Second toe pain.

EXAM:
LEFT FOOT - COMPLETE 3+ VIEW

[foot ap]
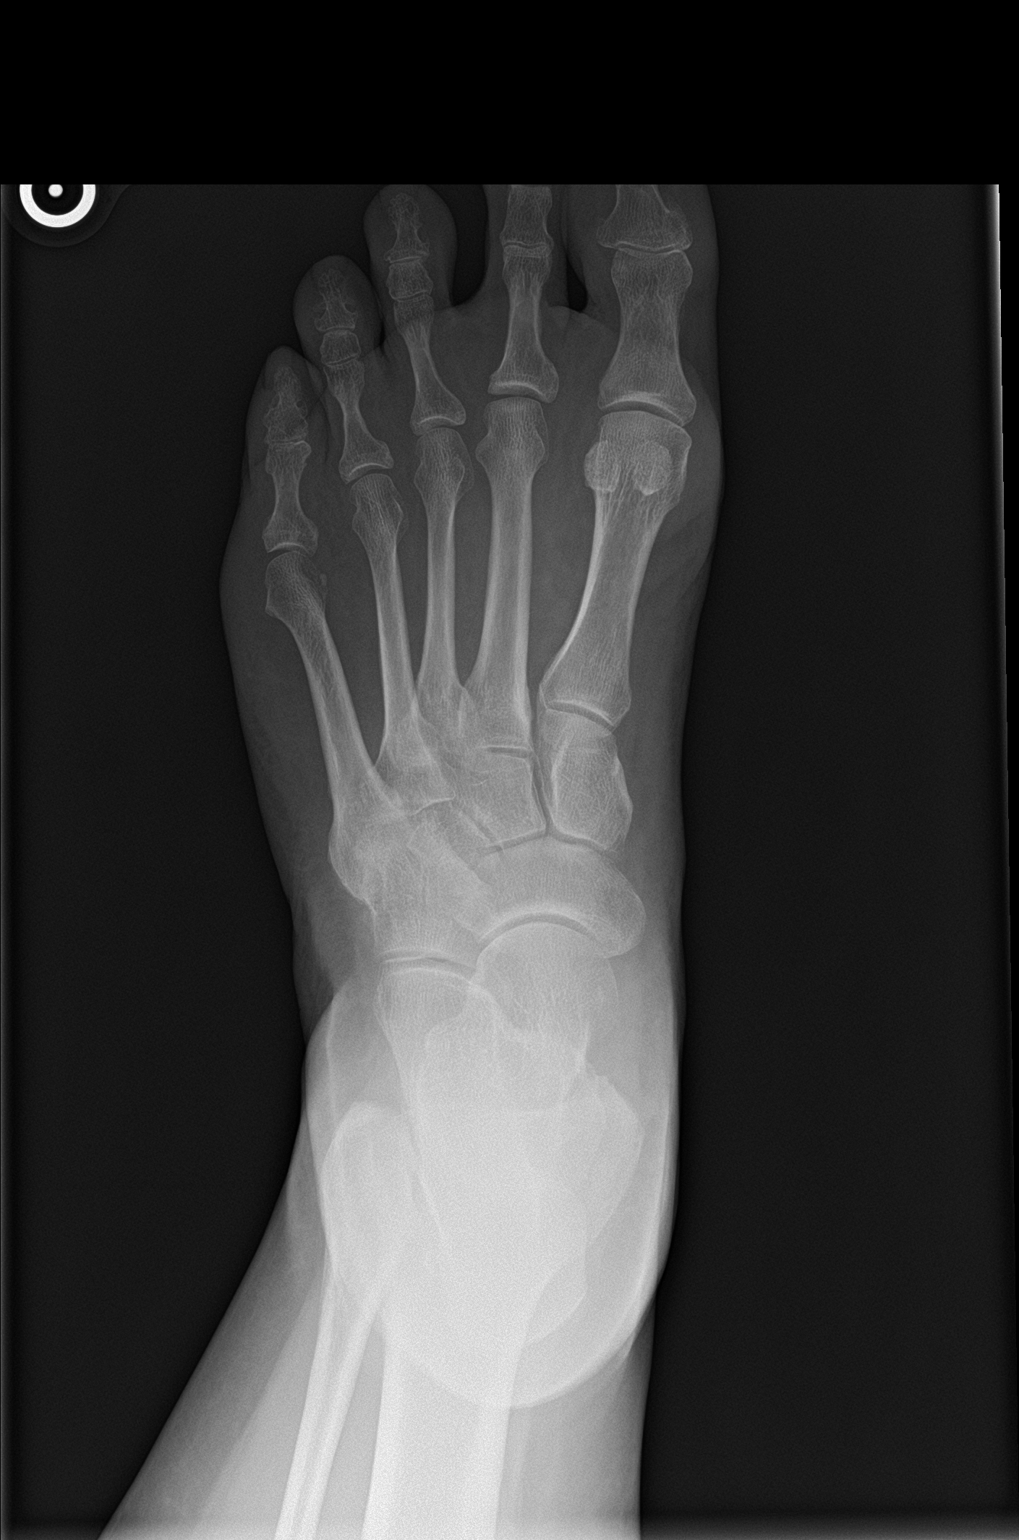

[foot obl]
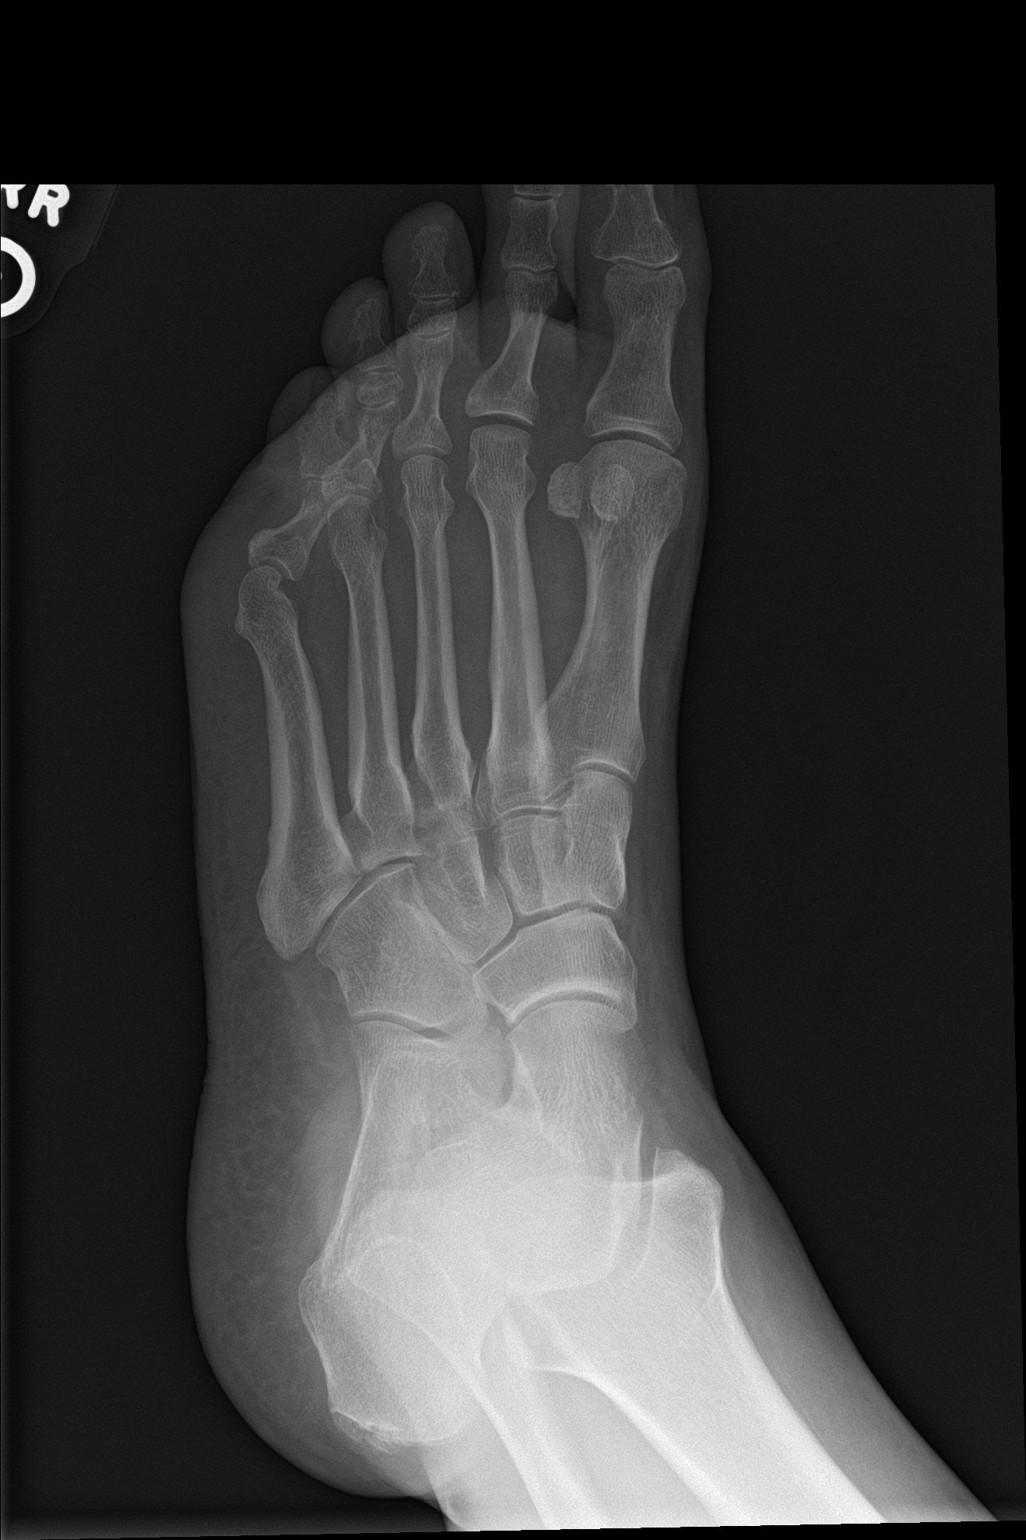

[foot lat]
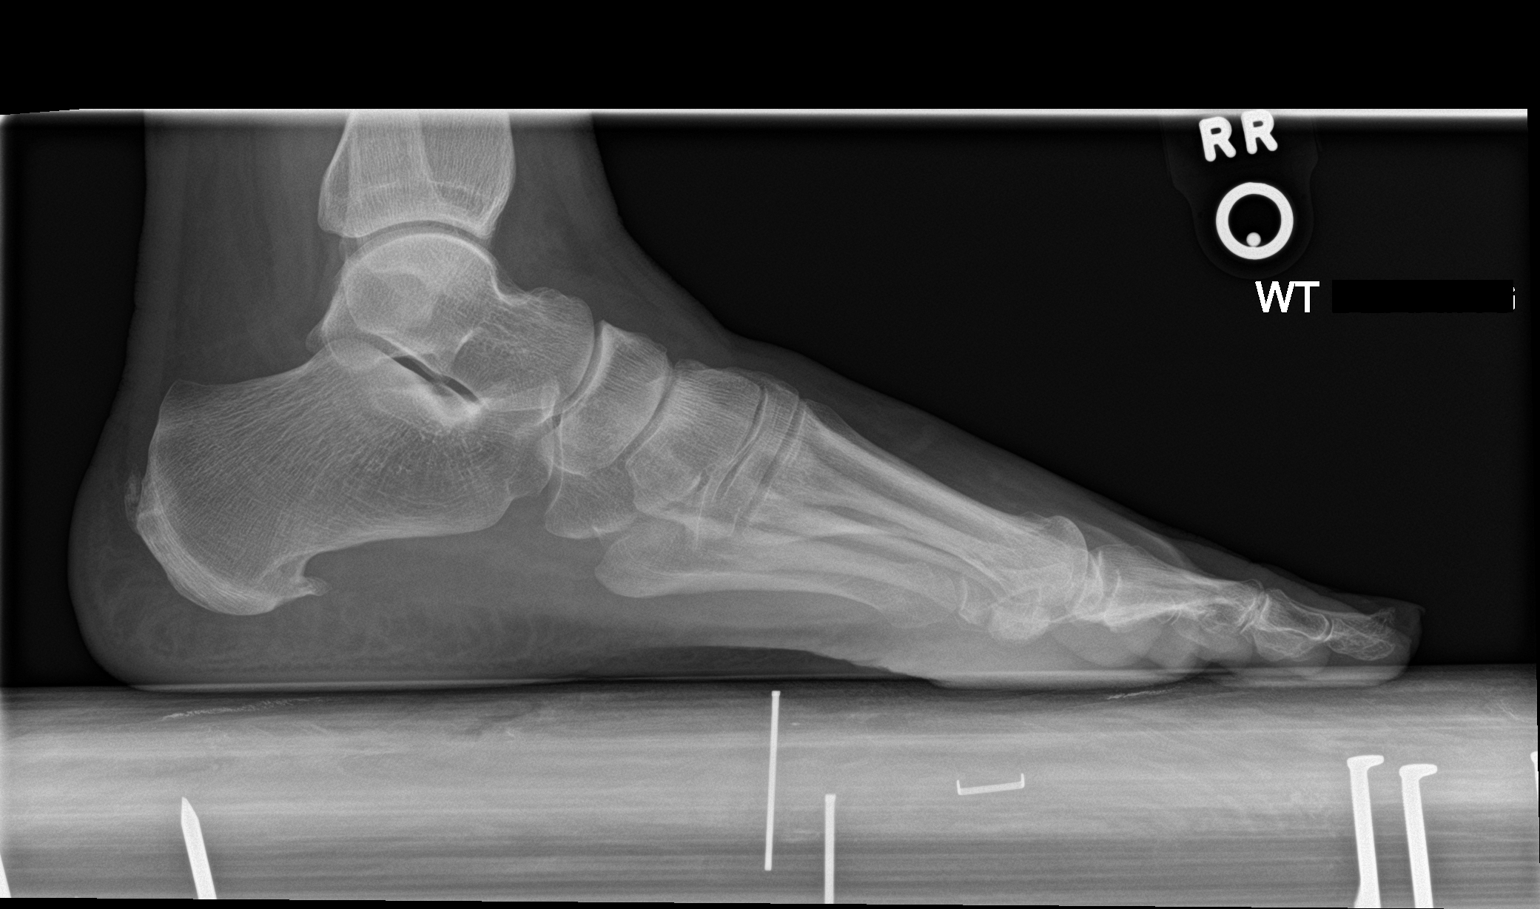

[3 of 3 positions shown; findings below may reference images not displayed]

FINDINGS: There is no evidence of fracture or dislocation. A small plantar
calcaneal spur is seen. There is no evidence of arthropathy. Soft
tissues are unremarkable.
IMPRESSION: Small plantar calcaneal spur.

## 2020-04-16 IMAGING — DX DG FOOT COMPLETE 3+V*R*
3 series · 3 of 3 positions shown · non-contrast
Comparison: None.

CLINICAL DATA: Second toe pain.

EXAM:
RIGHT FOOT COMPLETE - 3+ VIEW

[foot ap]
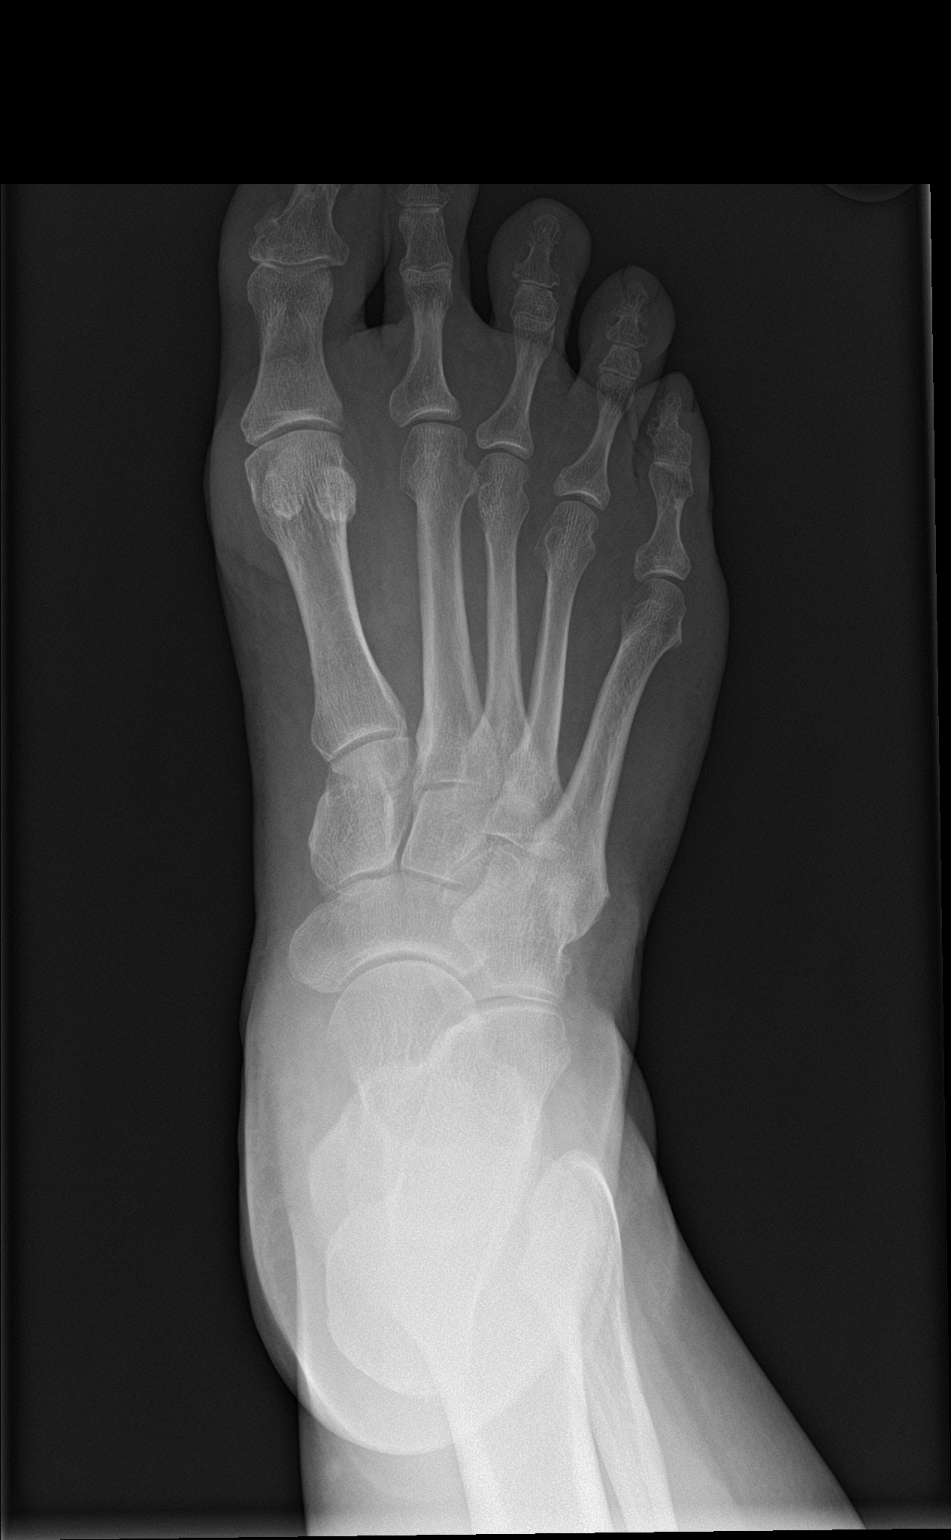

[foot obl]
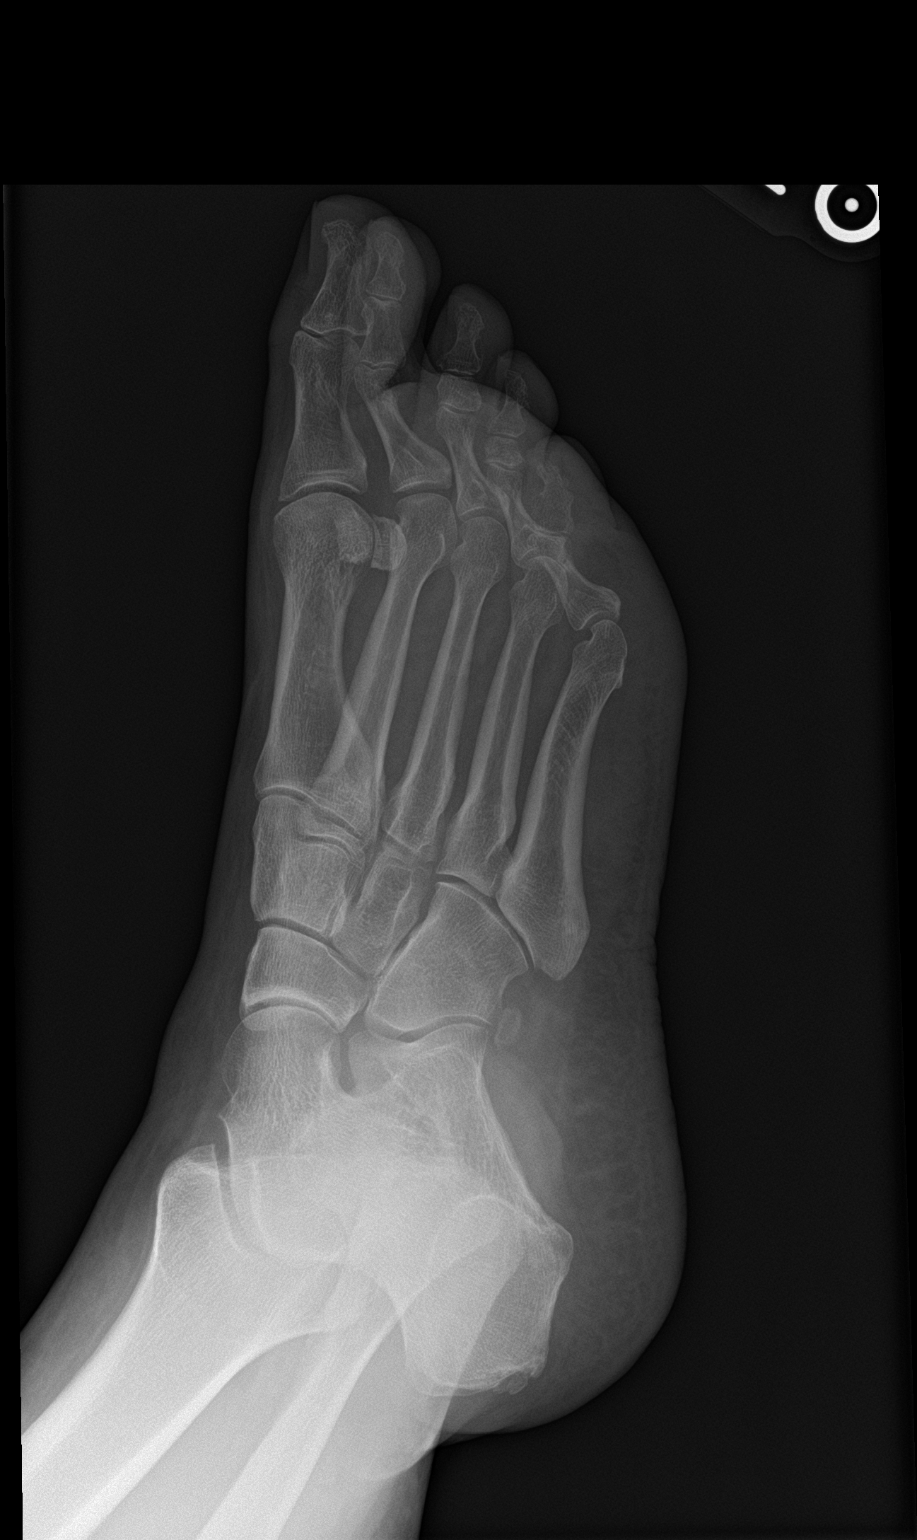

[foot lat]
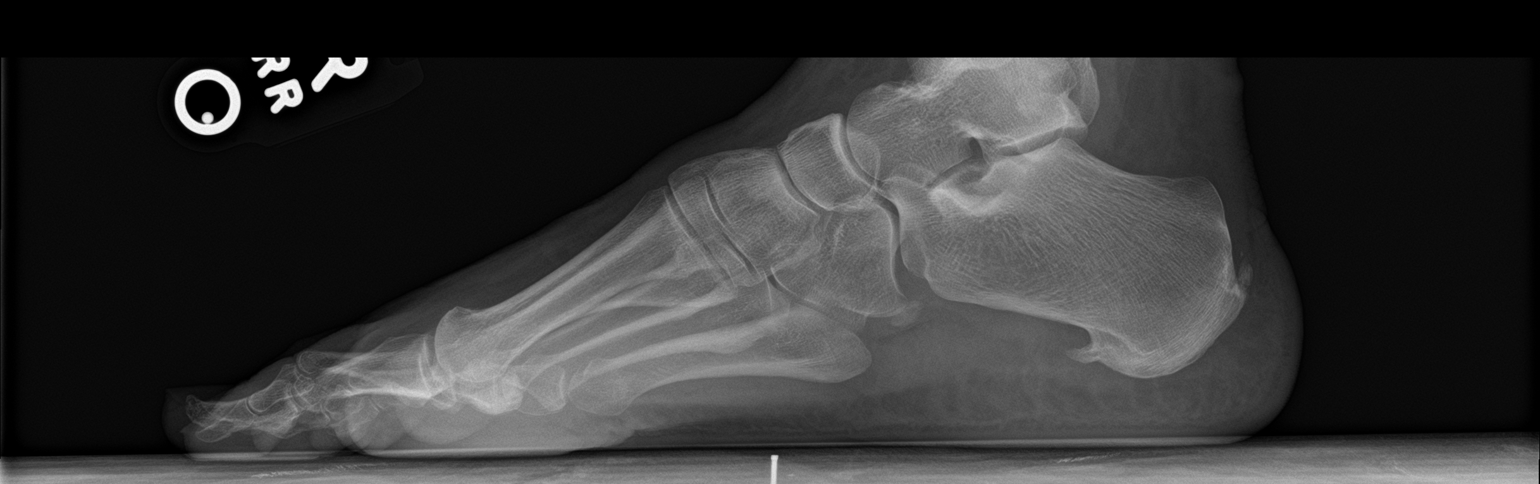

[3 of 3 positions shown; findings below may reference images not displayed]

FINDINGS: There is no evidence of fracture or dislocation. A small to moderate
sized plantar calcaneal spur is seen. There is no evidence of
arthropathy. Soft tissues are unremarkable.
IMPRESSION: Small to moderate sized plantar calcaneal spur.

## 2020-04-16 NOTE — Patient Instructions (Signed)

## 2020-04-16 NOTE — Progress Notes (Signed)
Subjective:   Patient ID: Autumn Garcia, female   DOB: 58 y.o.   MRN: [id number]   HPI 59 year old female presents the office today with her husband for concerns of bilateral foot pain with left side worse than the right has been gradually worsening over the last 2 years.  Majority discomfort is to the ball of her foot and she has noticed that her second and third toes have been separating.  She has done some research online and she feels that she may have a plantar plate injury to the left second toe with left side worse than right.  She has been trying to take the toe down which helps some.  She states that she also feels a "crunchy" sensation mostly in the ball of her foot in the mornings when she gets up.  She has secondary concerns of discomfort in the bilateral heels which started out 2 years ago as well after she was doing a lot of walking in the hospital visiting her husband.  She states this is not the main source of soreness to her feet.  She denies any recent injury or trauma to her feet.  Some occasional swelling to the balls of the feet.  Review of Systems  All other systems reviewed and are negative.  Past Medical History:  Diagnosis Date   Anxiety    Chronic kidney disease    Depression    Eating disorder    Epilepsy ([place])    HSV infection    Kidney stones    Murmur    PTSD (post-traumatic stress disorder)    Skin cancer of forehead    If could be a more serious cancer and will not know until after surgery   TB (tuberculosis)    Tested positive and treated    Past Surgical History:  Procedure Laterality Date   CERVICAL POLYPECTOMY     [place]  2010.     Dr. Angeline Slim  at [facility] for large stone   left knee surgery     Removal of basil cell carcinoma       Current Outpatient Medications:    diazepam (VALIUM) 2 MG tablet, Take 1 tablet (2 mg total) by mouth every 8 (eight) hours as needed  for anxiety., Disp: 90 tablet, Rfl: 0   EPINEPHrine 0.3 mg/0.3 mL IJ SOAJ injection, USE AS DIRECTED AND AS NEEDED FOR SEVERE ALLERGIC REACTION, Disp: 2 each, Rfl: prn   L-Lysine 500 MG TABS, Take 500 mg by mouth., Disp: , Rfl:   Allergies  Allergen Reactions   Demerol [Meperidine] Nausea And Vomiting, Nausea Only and Other (See Comments)    seizures   Latex Rash   Nickel     infections   Ciprofloxacin     REACTION: sucidal   Citrus     Geographic tongue   Codeine     REACTION: Halluncination   Erythromycin     REACTION: Vomiting   Lactose Intolerance (Gi)     GI upset   Lidocaine Other (See Comments)    Pt unsure if allergic. Last reaction at the dentist and had a seizure, but could have been injected into a vein   Meperidine Hcl     REACTION: Muscle weakness, vomiting   Peanut Oil     Other reaction(s): Other (See Comments) ORAL ULCERS   Soy Allergy     Other reaction(s): Other (See Comments) MOUTH ULCER  Objective:  Physical Exam  General: AAO x3, NAD  Dermatological: Skin is warm, dry and supple bilateral. Nails x 10 are well manicured; remaining integument appears unremarkable at this time. There are no open sores, no preulcerative lesions, no rash or signs of infection present.  Vascular: Dorsalis Pedis artery and Posterior Tibial artery pedal pulses are 2/4 bilateral with immedate capillary fill time.  There is no pain with calf compression, swelling, warmth, erythema.   Neruologic: Grossly intact via light touch bilateral.   Musculoskeletal: Increased calcaneal pitch angle.  Discomfort present on the second interspace of the left foot there is palpable neuroma identified.  There is splaying of the second and third toes bilaterally left side worse than the right.  No segment tenderness palpation on the right side.  No area pinpoint tenderness.  There is minimal discomfort of the plantar medial tubercle of the calcaneus at the insertion of  plantar fascia.  Plantar pressure appears to be intact.  Hammertoe contractures present.  Achilles tendon appears to be intact.  Muscular strength 5/5 in all groups tested bilateral.  Gait: Unassisted, Nonantalgic.       Assessment:   59 year old female with concern for plantar plate injury left foot, neuroma; plantar fasciitis    Plan:  -Treatment options discussed including all alternatives, risks, and complications -Etiology of symptoms were discussed -X-rays were obtained and independently reviewed.  I reviewed them with her as well.  There is no evidence of acute fracture identified.  Splaying of the left second and third toes are noted with mild changes present the second MPJs.  Heel spur is evident.  Awaiting radiology report. -She wants to try to keep the treatment holistic and was open to medications.  Recommended orthotics and she was molded today for these.  We will do a heel cut out for the bone spur as well as a metatarsal pad bilaterally. -Referral to physical therapy.  We will do PT at the [facility].  -Ice -Discussed some activity modifications.  RTC 4 weeks or sooner if needed  Trula Slade DPM

## 2020-04-16 NOTE — Telephone Encounter (Signed)
Thanks. Can you remove the charges?   We can try the powerstep with a metatarsal pad

## 2020-04-16 NOTE — Telephone Encounter (Signed)
pts husband called and left message wanting to hold off on the orthotics.  I returned call and they were looking into medicare to see if they would cover them and I explained they have to meet criteria and they never do. I also explained that we do have a payment plan of 100.00 down and make payments. He still wanted to hold off. I also offered him powersteps from our office.He is going to think about it and let you know.

## 2020-04-19 ENCOUNTER — Telehealth: Payer: Self-pay | Admitting: *Deleted

## 2020-04-19 DIAGNOSIS — S99929S Unspecified injury of unspecified foot, sequela: Secondary | ICD-10-CM

## 2020-04-19 DIAGNOSIS — M6281 Muscle weakness (generalized): Secondary | ICD-10-CM | POA: Diagnosis not present

## 2020-04-19 DIAGNOSIS — R102 Pelvic and perineal pain: Secondary | ICD-10-CM | POA: Diagnosis not present

## 2020-04-19 DIAGNOSIS — D3610 Benign neoplasm of peripheral nerves and autonomic nervous system, unspecified: Secondary | ICD-10-CM

## 2020-04-19 DIAGNOSIS — M722 Plantar fascial fibromatosis: Secondary | ICD-10-CM

## 2020-04-19 DIAGNOSIS — N9411 Superficial (introital) dyspareunia: Secondary | ICD-10-CM | POA: Diagnosis not present

## 2020-04-19 DIAGNOSIS — M62838 Other muscle spasm: Secondary | ICD-10-CM | POA: Diagnosis not present

## 2020-04-19 DIAGNOSIS — M79671 Pain in right foot: Secondary | ICD-10-CM

## 2020-04-19 NOTE — Telephone Encounter (Signed)
-----   Message from Trula Slade, DPM sent at 04/16/2020  9:21 AM EDT ----- Can you please refer to [facility]? Thanks.

## 2020-04-19 NOTE — Telephone Encounter (Signed)
Faxed orders, and SnapShot with clinicals and demographics to [facility].

## 2020-04-21 ENCOUNTER — Ambulatory Visit (INDEPENDENT_AMBULATORY_CARE_PROVIDER_SITE_OTHER): Payer: Medicare Other

## 2020-04-21 ENCOUNTER — Other Ambulatory Visit: Payer: Self-pay

## 2020-04-21 DIAGNOSIS — Z1231 Encounter for screening mammogram for malignant neoplasm of breast: Secondary | ICD-10-CM

## 2020-04-21 IMAGING — MG DIGITAL SCREENING BILAT W/ TOMO W/ CAD
8 series · 8 of 24 positions shown · non-contrast
Comparison: Previous exams.

CLINICAL DATA: Screening.

EXAM:
DIGITAL SCREENING BILATERAL MAMMOGRAM WITH TOMO AND CAD

[L MLO synth-2D]
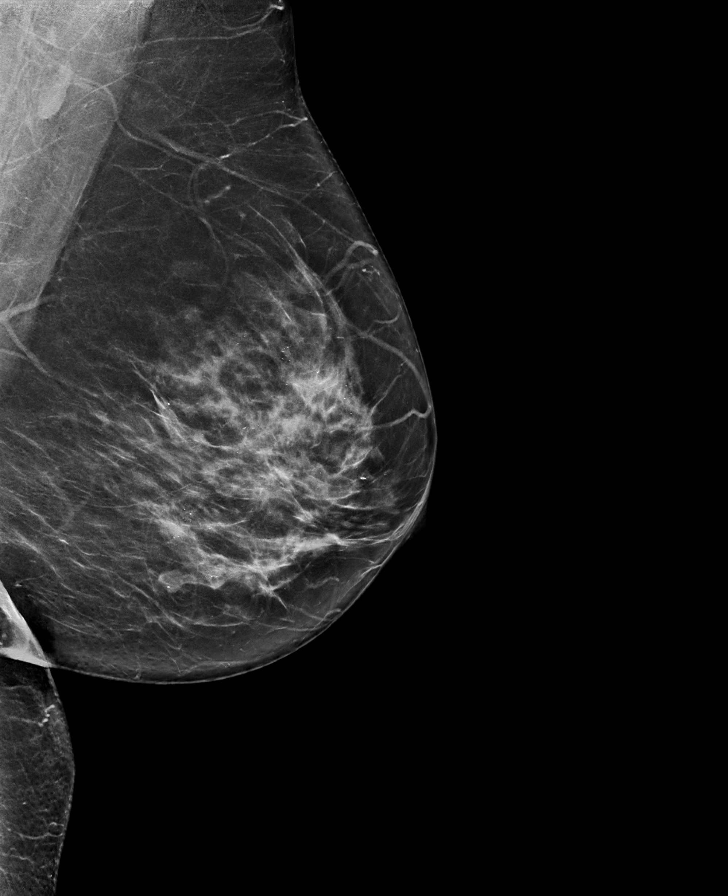

[L CC synth-2D]
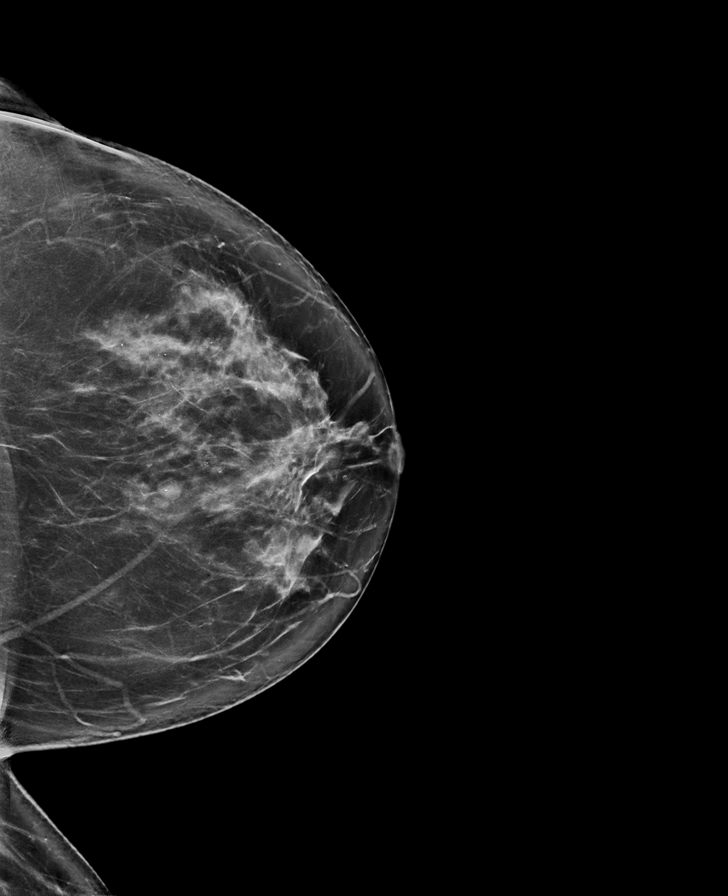

[R CC synth-2D]
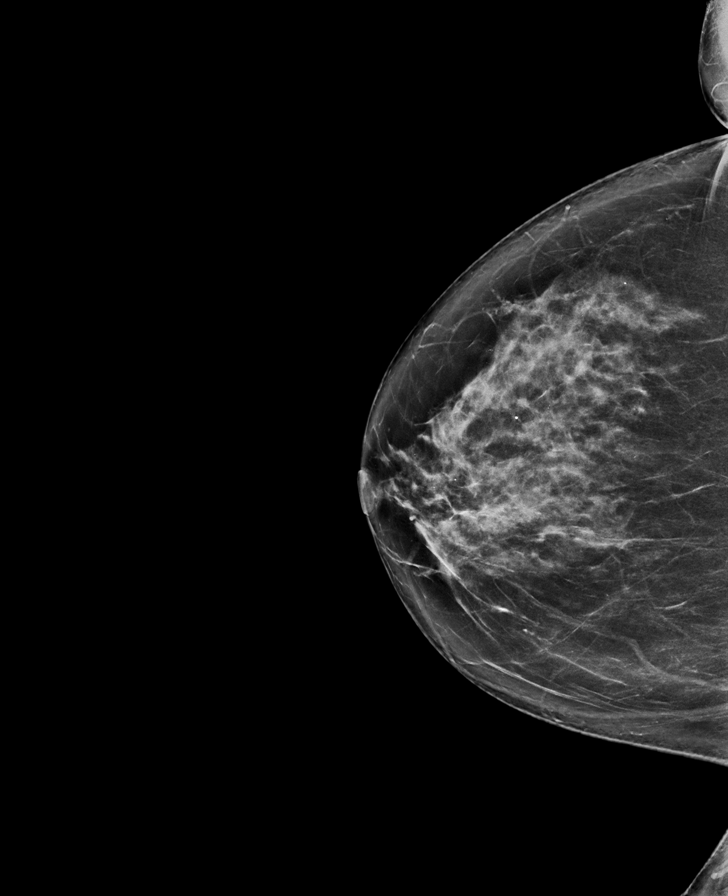

[R MLO synth-2D]
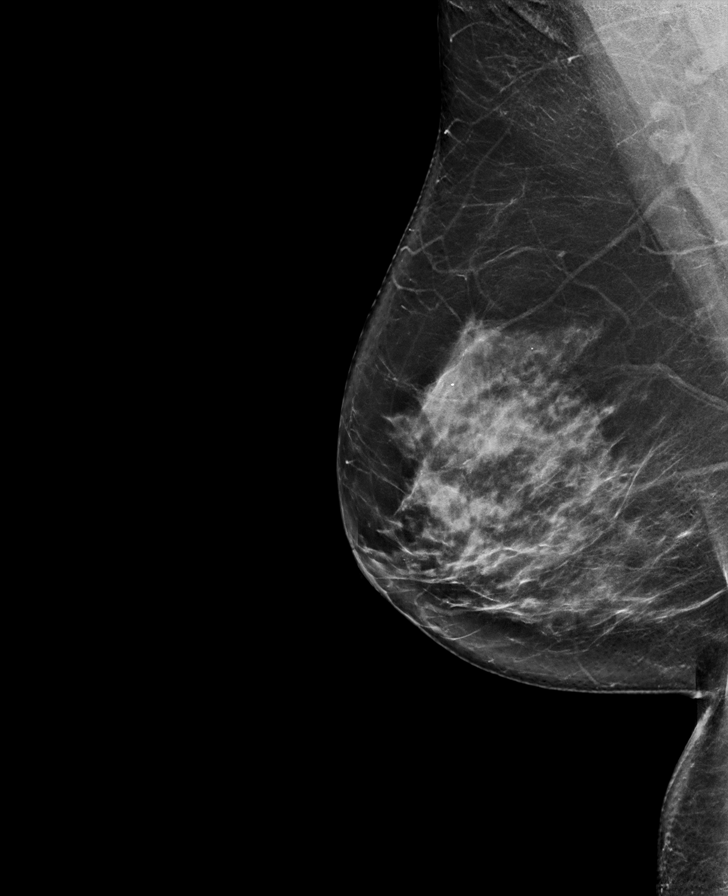

[R MLO tomo · tomo slice 43/86.0]
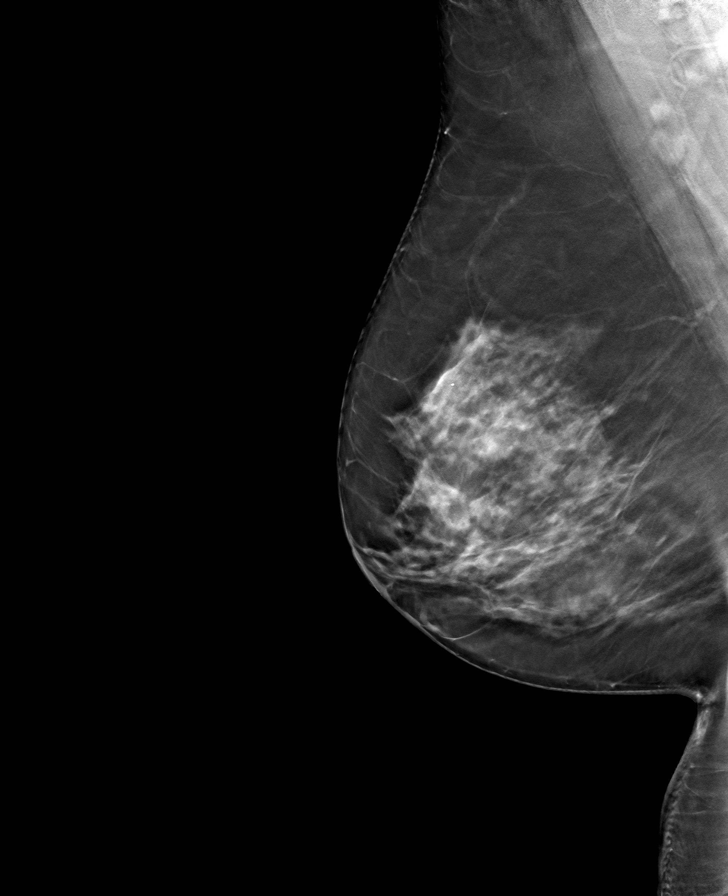

[L MLO tomo · tomo slice 40/79.0]
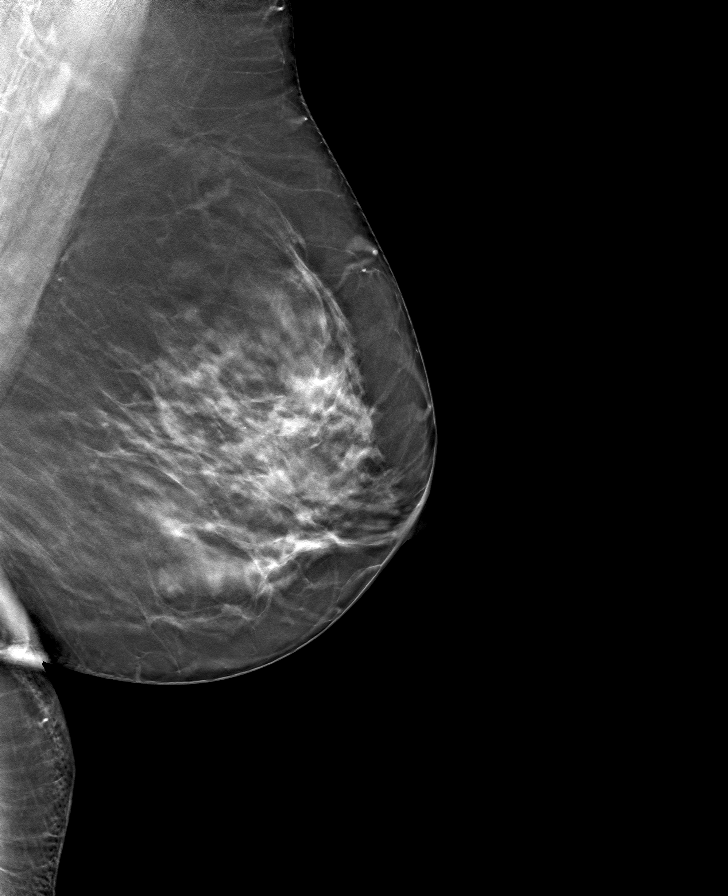

[L CC tomo · tomo slice 43/84.0]
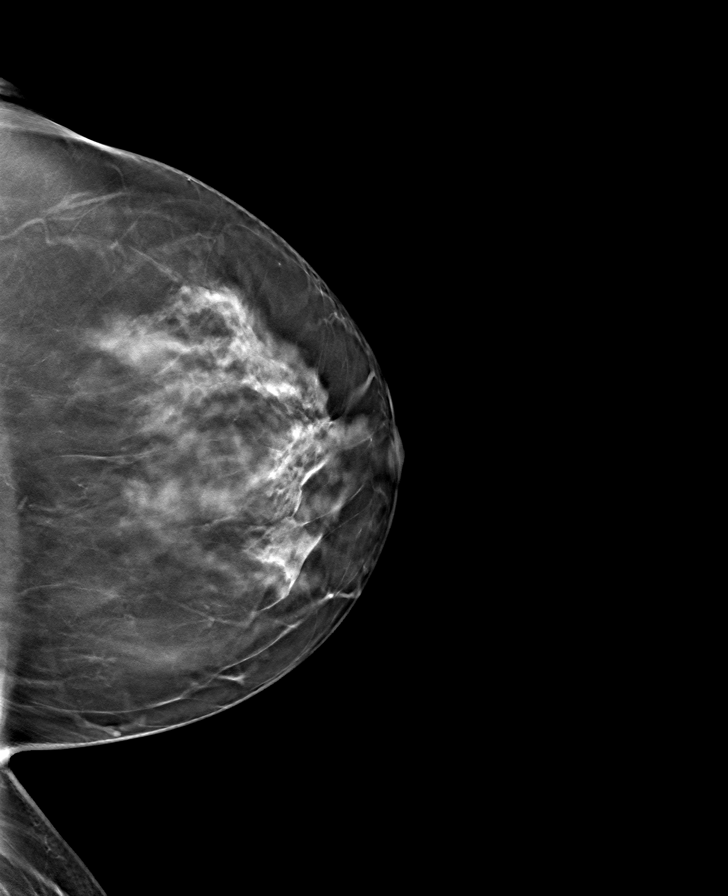

[R CC tomo · tomo slice 40/79.0]
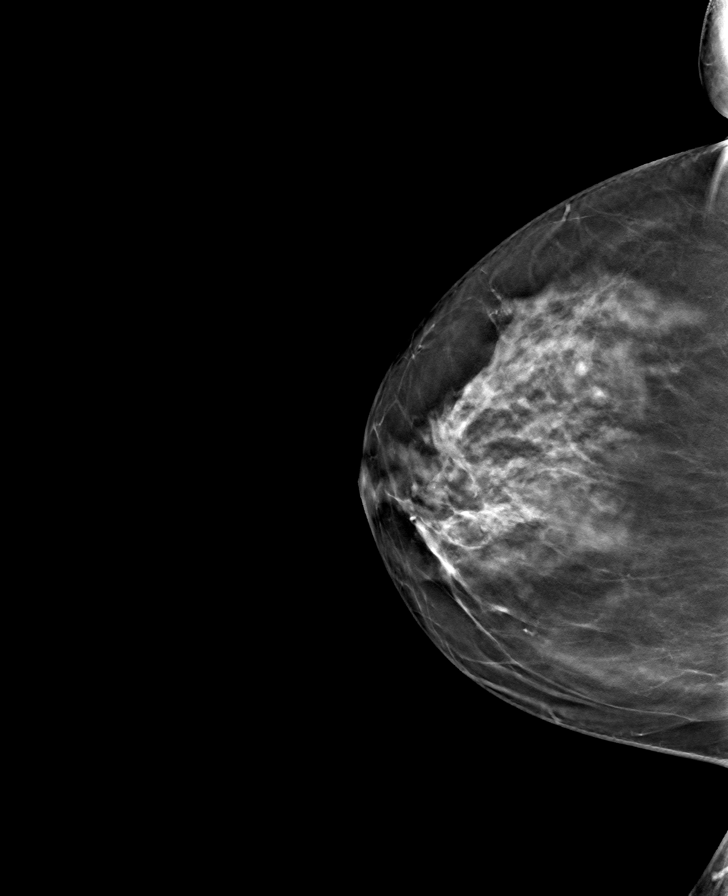

[8 of 24 positions shown; findings below may reference images not displayed]

ACR Breast Density Category c: The breast tissue is heterogeneously
dense, which may obscure small masses.
FINDINGS: In the left breast, a possible mass as well as calcifications
warrants further evaluation. In the right breast, no findings
suspicious for malignancy.

Images were processed with CAD.
IMPRESSION: Further evaluation is suggested for mass in the left breast and
calcifications.

RECOMMENDATION:
Ultrasound of the left breast mass and diagnostic mammogram with
magnification views for the left breast calcifications.
(Code:[IU])

The patient will be contacted regarding the findings, and additional
imaging will be scheduled.

BI-RADS CATEGORY  0: Incomplete. Need additional imaging evaluation
and/or prior mammograms for comparison.

## 2020-04-23 ENCOUNTER — Other Ambulatory Visit: Payer: Self-pay | Admitting: Family Medicine

## 2020-04-23 DIAGNOSIS — R928 Other abnormal and inconclusive findings on diagnostic imaging of breast: Secondary | ICD-10-CM

## 2020-05-03 ENCOUNTER — Other Ambulatory Visit: Payer: Self-pay

## 2020-05-03 ENCOUNTER — Ambulatory Visit
Admission: RE | Admit: 2020-05-03 | Discharge: 2020-05-03 | Disposition: A | Discharge status: Home / Self Care | Payer: Medicare Other | Source: Ambulatory Visit | Attending: Family Medicine | Admitting: Family Medicine

## 2020-05-03 DIAGNOSIS — R921 Mammographic calcification found on diagnostic imaging of breast: Secondary | ICD-10-CM | POA: Diagnosis not present

## 2020-05-03 DIAGNOSIS — R928 Other abnormal and inconclusive findings on diagnostic imaging of breast: Secondary | ICD-10-CM

## 2020-05-03 DIAGNOSIS — N6002 Solitary cyst of left breast: Secondary | ICD-10-CM | POA: Diagnosis not present

## 2020-05-03 IMAGING — MG MM DIGITAL DIAGNOSTIC UNILAT*L* W/ TOMO W/ CAD
7 series · 8 of 15 positions shown · non-contrast
Comparison: Previous exams.

CLINICAL DATA: Screening recall for left breast mass as well as
left breast calcifications.

EXAM:
DIGITAL DIAGNOSTIC UNILATERAL LEFT MAMMOGRAM WITH TOMO AND CAD;
ULTRASOUND LEFT BREAST LIMITED

[L ML (1 of 2)]
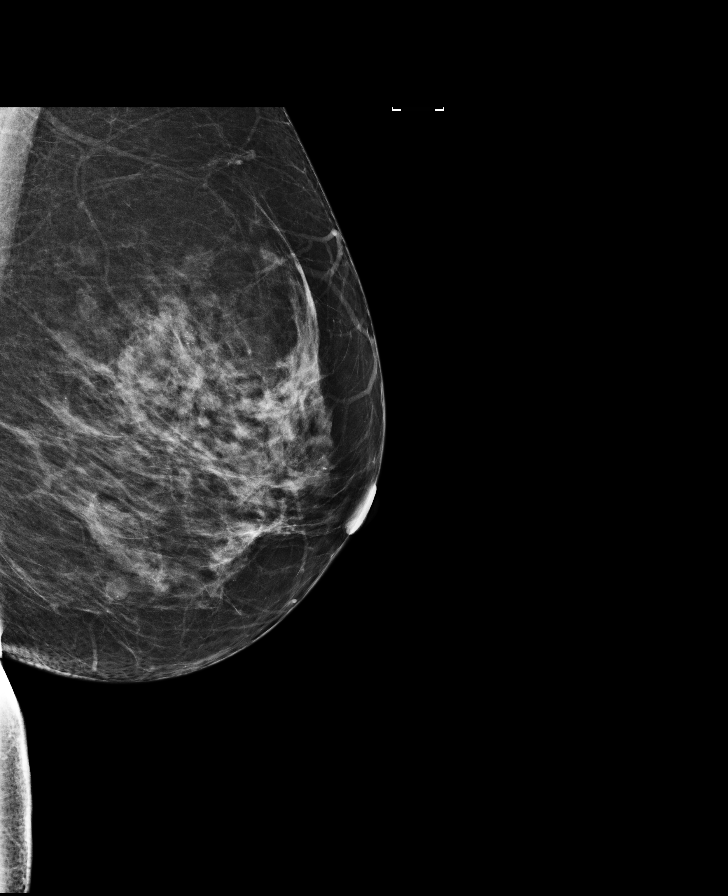

[L CC]
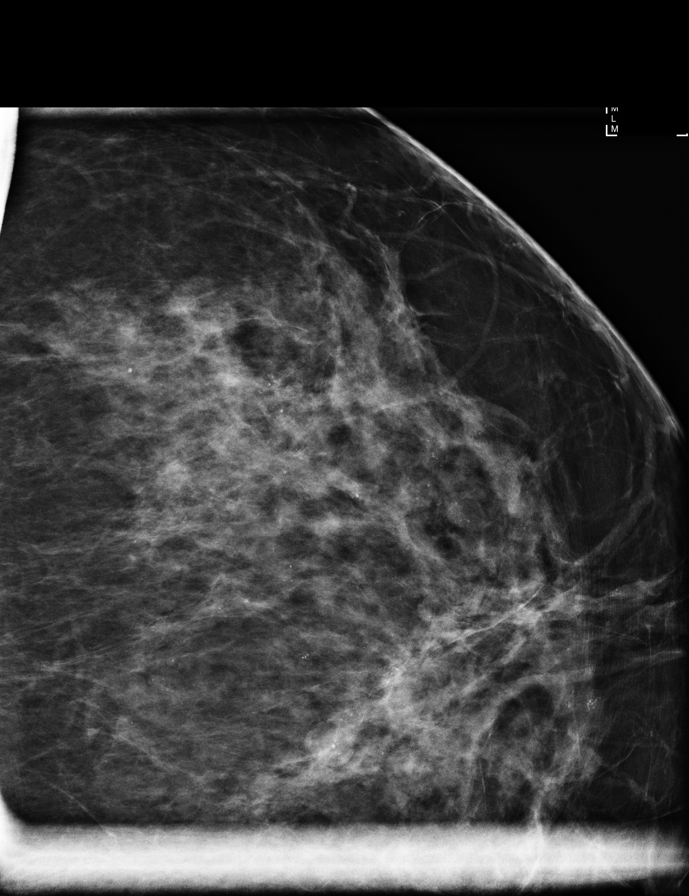

[L ML (2 of 2)]
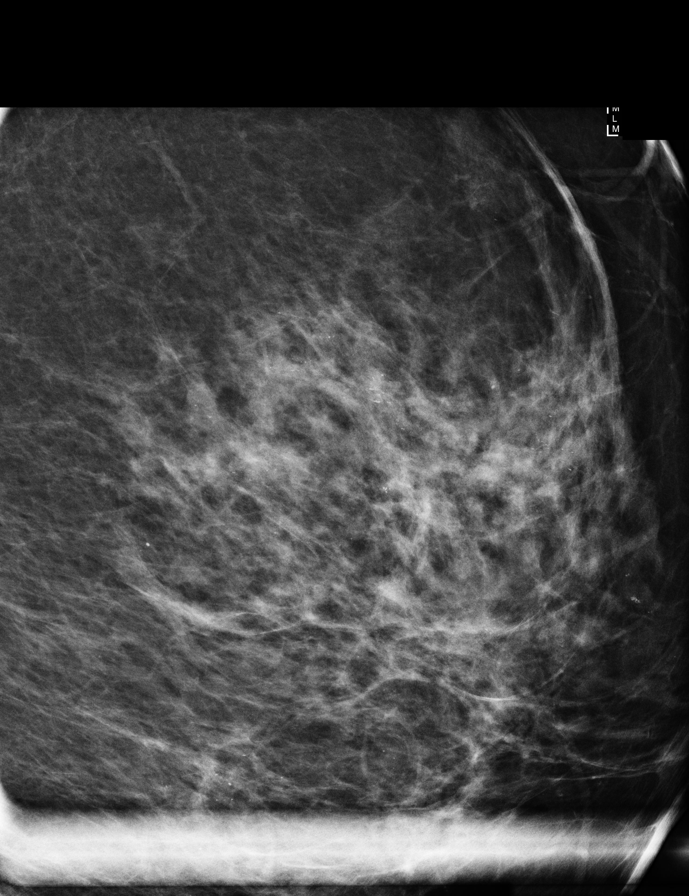

[L CC synth-2D]
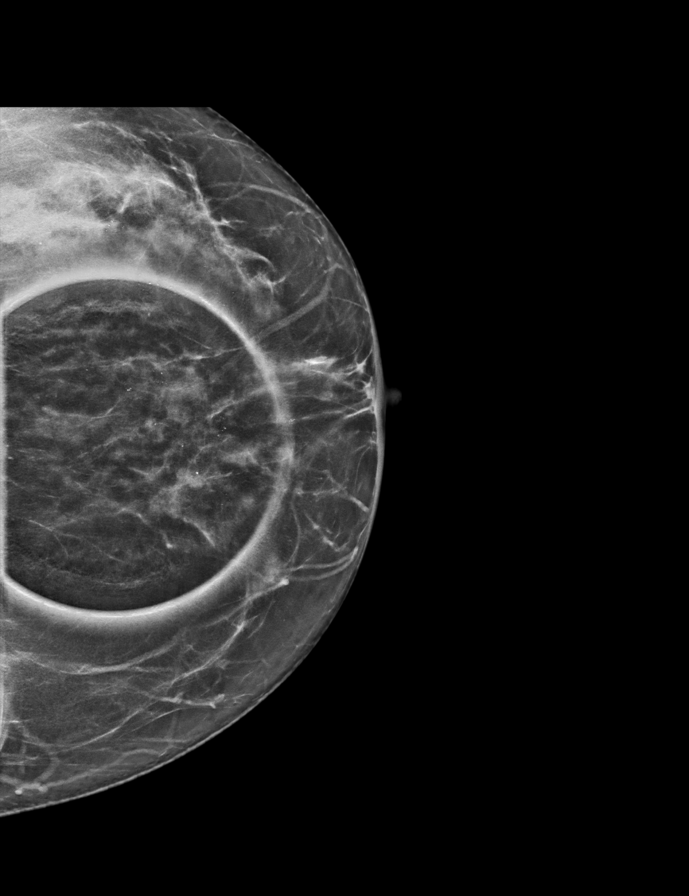

[L MLO synth-2D]
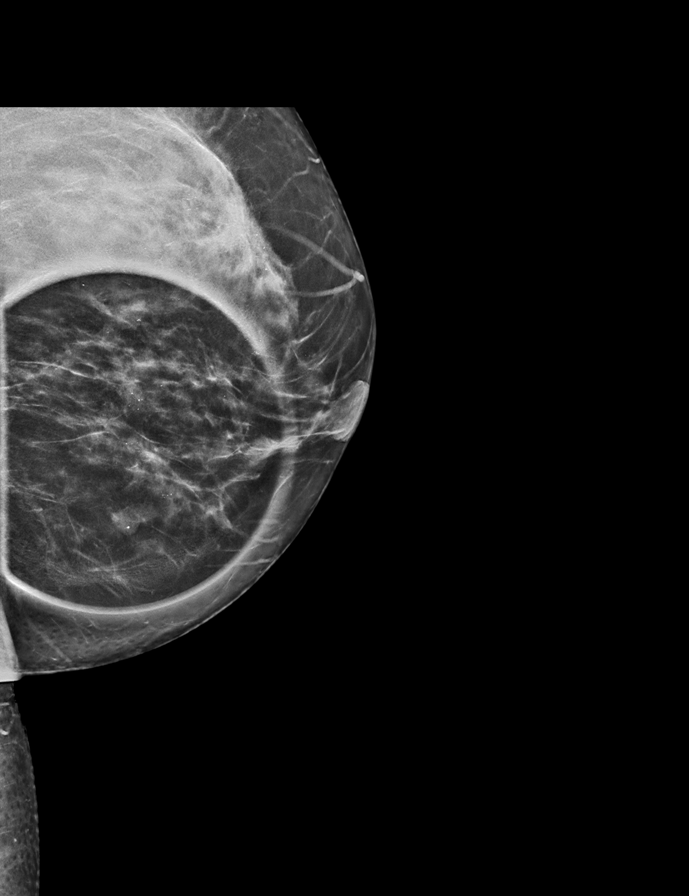

[L CC tomo · 2 of 63 frames shown]
[frame 21/63]
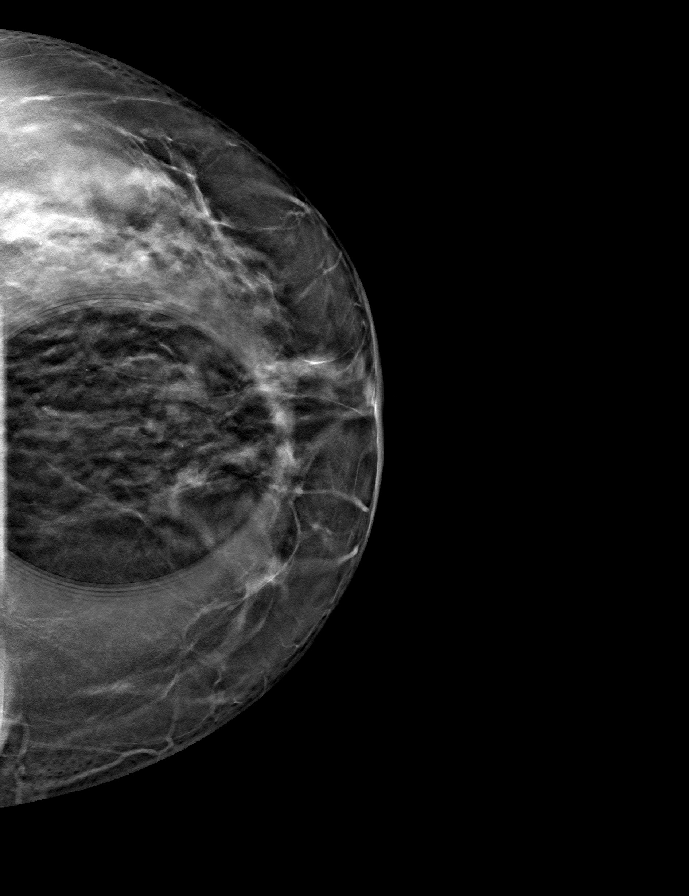
[frame 32/63]
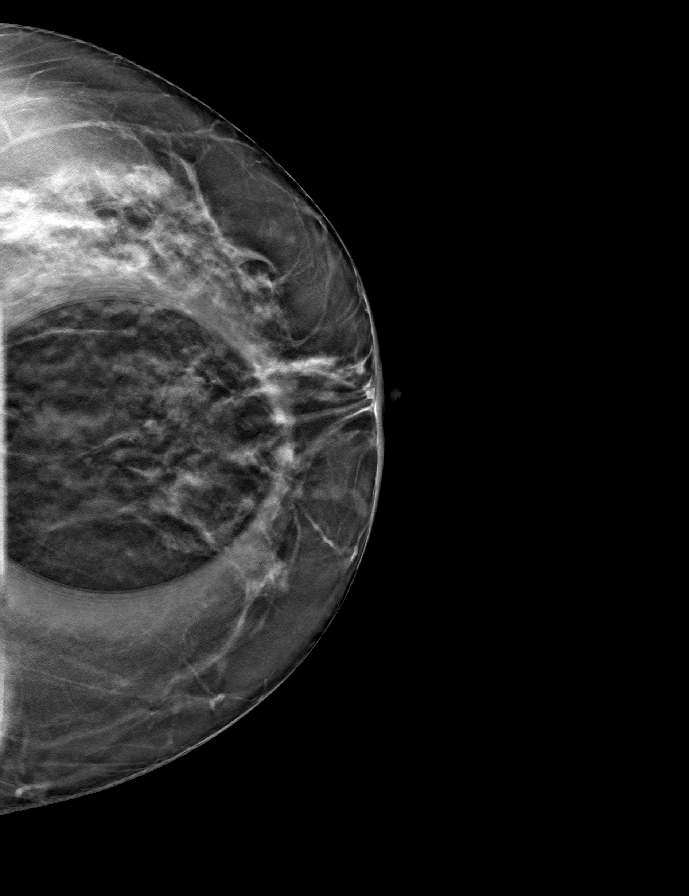

[L MLO tomo · tomo slice 35/68.0]
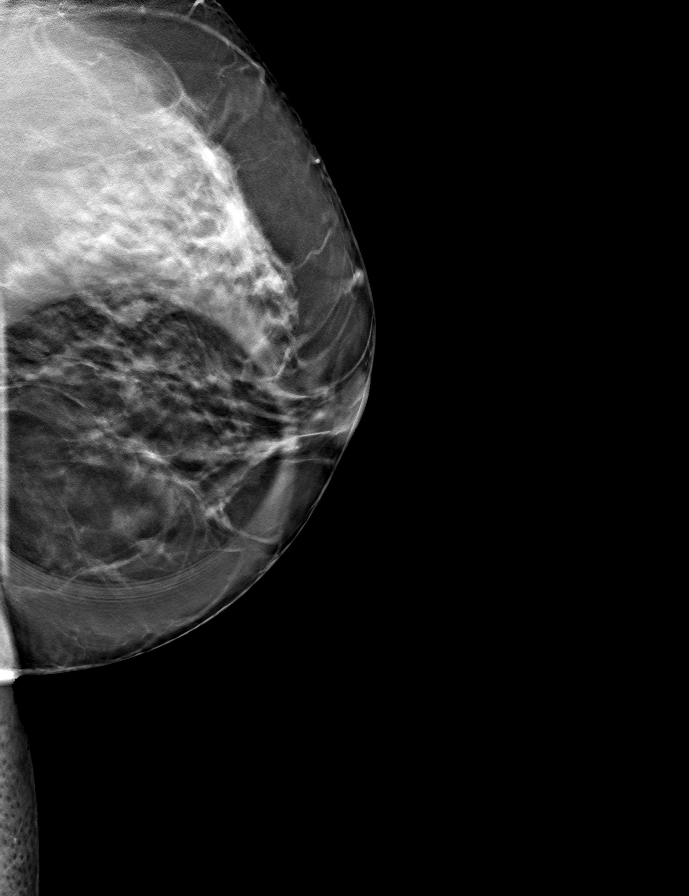

[8 of 15 positions shown; findings below may reference images not displayed]

ACR Breast Density Category c: The breast tissue is heterogeneously
dense, which may obscure small masses.
FINDINGS: Spot compression magnification views were performed over the
upper-outer left breast demonstrating diffuse predominantly punctate
and round calcifications, many of which demonstrate layering on the
spot compression magnification ML view with findings compatible with
benign milk of calcium.

Mammographic images were processed with CAD.

Targeted ultrasound of the lower left breast was performed. There is
a simple cyst containing a small calcification in the left breast at
6 o'clock 3 cm from nipple measuring 0.6 x 0.5 x 0.7 cm. This
corresponds well with the mass seen in the left breast at
mammography. No suspicious masses or abnormality seen in the lower
left breast.
IMPRESSION: No findings of malignancy in the left breast.

RECOMMENDATION:
Screening mammogram in one year.(Code:[FR])

I have discussed the findings and recommendations with the patient.
If applicable, a reminder letter will be sent to the patient
regarding the next appointment.

BI-RADS CATEGORY  2: Benign.

## 2020-05-07 ENCOUNTER — Other Ambulatory Visit: Payer: Medicare Other

## 2020-05-07 DIAGNOSIS — M62838 Other muscle spasm: Secondary | ICD-10-CM | POA: Diagnosis not present

## 2020-05-07 DIAGNOSIS — R102 Pelvic and perineal pain: Secondary | ICD-10-CM | POA: Diagnosis not present

## 2020-05-07 DIAGNOSIS — M6281 Muscle weakness (generalized): Secondary | ICD-10-CM | POA: Diagnosis not present

## 2020-05-07 DIAGNOSIS — N9411 Superficial (introital) dyspareunia: Secondary | ICD-10-CM | POA: Diagnosis not present

## 2020-05-12 ENCOUNTER — Ambulatory Visit (INDEPENDENT_AMBULATORY_CARE_PROVIDER_SITE_OTHER): Payer: Medicare Other | Admitting: Rehabilitative and Restorative Service Providers"

## 2020-05-12 ENCOUNTER — Encounter: Payer: Self-pay | Admitting: Rehabilitative and Restorative Service Providers"

## 2020-05-12 ENCOUNTER — Other Ambulatory Visit: Payer: Self-pay

## 2020-05-12 DIAGNOSIS — M6281 Muscle weakness (generalized): Secondary | ICD-10-CM | POA: Diagnosis not present

## 2020-05-12 DIAGNOSIS — M79671 Pain in right foot: Secondary | ICD-10-CM

## 2020-05-12 DIAGNOSIS — R29898 Other symptoms and signs involving the musculoskeletal system: Secondary | ICD-10-CM

## 2020-05-12 DIAGNOSIS — M79672 Pain in left foot: Secondary | ICD-10-CM | POA: Diagnosis not present

## 2020-05-12 NOTE — Patient Instructions (Signed)
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
## 2020-05-14 ENCOUNTER — Ambulatory Visit: Payer: Medicare Other | Admitting: Podiatry

## 2020-05-17 ENCOUNTER — Ambulatory Visit: Payer: Medicare Other | Admitting: Obstetrics & Gynecology

## 2020-05-17 ENCOUNTER — Encounter: Payer: Medicare Other | Admitting: Rehabilitative and Restorative Service Providers"

## 2020-05-19 DIAGNOSIS — M62838 Other muscle spasm: Secondary | ICD-10-CM | POA: Diagnosis not present

## 2020-05-19 DIAGNOSIS — M6281 Muscle weakness (generalized): Secondary | ICD-10-CM | POA: Diagnosis not present

## 2020-05-19 DIAGNOSIS — R102 Pelvic and perineal pain: Secondary | ICD-10-CM | POA: Diagnosis not present

## 2020-05-19 DIAGNOSIS — N9411 Superficial (introital) dyspareunia: Secondary | ICD-10-CM | POA: Diagnosis not present

## 2020-05-20 ENCOUNTER — Encounter: Payer: Medicare Other | Admitting: Rehabilitative and Restorative Service Providers"

## 2020-05-25 ENCOUNTER — Encounter: Payer: Medicare Other | Admitting: Physical Therapy

## 2020-05-25 DIAGNOSIS — G5782 Other specified mononeuropathies of left lower limb: Secondary | ICD-10-CM | POA: Diagnosis not present

## 2020-05-27 ENCOUNTER — Encounter: Payer: Medicare Other | Admitting: Physical Therapy

## 2020-05-27 ENCOUNTER — Ambulatory Visit: Payer: Medicare Other | Admitting: Physical Therapy

## 2020-06-01 ENCOUNTER — Encounter: Payer: Medicare Other | Admitting: Rehabilitative and Restorative Service Providers"

## 2020-06-03 ENCOUNTER — Encounter: Payer: Medicare Other | Admitting: Physical Therapy

## 2020-06-03 ENCOUNTER — Ambulatory Visit: Payer: Medicare Other | Admitting: Physical Therapy

## 2020-06-10 ENCOUNTER — Encounter: Payer: Medicare Other | Admitting: Physical Therapy

## 2020-06-14 ENCOUNTER — Ambulatory Visit: Payer: Medicare Other | Admitting: Obstetrics & Gynecology

## 2020-06-17 ENCOUNTER — Encounter: Payer: Medicare Other | Admitting: Physical Therapy

## 2020-06-18 ENCOUNTER — Ambulatory Visit: Payer: Medicare Other | Admitting: Podiatry

## 2020-07-21 DIAGNOSIS — N9411 Superficial (introital) dyspareunia: Secondary | ICD-10-CM | POA: Diagnosis not present

## 2020-07-21 DIAGNOSIS — R102 Pelvic and perineal pain: Secondary | ICD-10-CM | POA: Diagnosis not present

## 2020-07-21 DIAGNOSIS — M6281 Muscle weakness (generalized): Secondary | ICD-10-CM | POA: Diagnosis not present

## 2020-07-21 DIAGNOSIS — M62838 Other muscle spasm: Secondary | ICD-10-CM | POA: Diagnosis not present

## 2020-07-24 ENCOUNTER — Emergency Department (INDEPENDENT_AMBULATORY_CARE_PROVIDER_SITE_OTHER)
Admission: EM | Admit: 2020-07-24 | Discharge: 2020-07-24 | Disposition: A | Discharge status: Home / Self Care | Payer: Medicare Other | Source: Home / Self Care

## 2020-07-24 ENCOUNTER — Other Ambulatory Visit: Payer: Self-pay

## 2020-07-24 DIAGNOSIS — N3001 Acute cystitis with hematuria: Secondary | ICD-10-CM | POA: Diagnosis not present

## 2020-07-24 DIAGNOSIS — R300 Dysuria: Secondary | ICD-10-CM

## 2020-07-24 LAB — POCT URINALYSIS DIP (MANUAL ENTRY)
Bilirubin, UA: NEGATIVE
Glucose, UA: NEGATIVE mg/dL
Ketones, POC UA: NEGATIVE mg/dL
Nitrite, UA: NEGATIVE
Protein Ur, POC: NEGATIVE mg/dL
Spec Grav, UA: <=1.005 — AB (ref 1.010–1.025)
Urobilinogen, UA: 0.2 E.U./dL
pH, UA: 6.5 (ref 5.0–8.0)

## 2020-07-24 MED ORDER — CEPHALEXIN 500 MG PO CAPS
500.0000 mg | ORAL_CAPSULE | Freq: Two times a day (BID) | ORAL | 0 refills | Status: DC
Start: 2020-07-24 — End: 2020-07-26

## 2020-07-24 NOTE — ED Triage Notes (Signed)
Pt states that she is having some abdominal pressure and some urinary urgency. x1 day.

## 2020-07-24 NOTE — Discharge Instructions (Addendum)
Make sure you hydrate very well with plain water and a quantity of 64 ounces of water a day.  Please limit drinks that are considered urinary irritants such as soda, sweet tea, coffee, energy drinks, alcohol.  These can worsen your UTI symptoms and also be the source of them.  I will let you know about your urine culture results through MyChart to see if we need to change your antibiotics based off of those results.

## 2020-07-24 NOTE — ED Provider Notes (Signed)
[place]   MRN: [id number] DOB: 07-20-1961  Subjective:   Autumn Garcia is a 59 y.o. female presenting for acute onset today of recurrent dysuria, urinary pressure, urinary frequency, urinary urgency.  Patient has a history of persistent kidney stones.  She had a major procedure done for the right kidney due to a staghorn kidney stone.  Patient is managed by urology for this.  She has also been managed by gynecology for weakening of her pelvic floor.  Has been using instrumentation for this.  Tries to hydrate very well with water.  Denies fever, nausea, vomiting, flank pain, frank hematuria.  No current facility-administered medications for this encounter.  Current Outpatient Medications:  .  diazepam (VALIUM) 2 MG tablet, Take 1 tablet (2 mg total) by mouth every 8 (eight) hours as needed for anxiety., Disp: 90 tablet, Rfl: 0 .  EPINEPHrine 0.3 mg/0.3 mL IJ SOAJ injection, USE AS DIRECTED AND AS NEEDED FOR SEVERE ALLERGIC REACTION, Disp: 2 each, Rfl: prn .  L-Lysine 500 MG TABS, Take 500 mg by mouth., Disp: , Rfl:    Allergies  Allergen Reactions  . Demerol [Meperidine] Nausea And Vomiting, Nausea Only and Other (See Comments)    seizures  . Latex Rash  . Nickel     infections  . Ciprofloxacin     REACTION: sucidal  . Citrus     Geographic tongue  . Codeine     REACTION: Halluncination  . Erythromycin     REACTION: Vomiting  . Lactose Intolerance (Gi)     GI upset  . Lidocaine Other (See Comments)    Pt unsure if allergic. Last reaction at the dentist and had a seizure, but could have been injected into a vein  . Meperidine Hcl     REACTION: Muscle weakness, vomiting  . Peanut Oil     Other reaction(s): Other (See Comments) ORAL ULCERS  . Soy Allergy     Other reaction(s): Other (See Comments) MOUTH ULCER    Past Medical History:  Diagnosis Date  . Anxiety   . Chronic kidney disease   . Depression   . Eating disorder   . Epilepsy  ([place])   . HSV infection   . Kidney stones   . Murmur   . PTSD (post-traumatic stress disorder)   . Skin cancer of forehead    If could be a more serious cancer and will not know until after surgery  . TB (tuberculosis)    Tested positive and treated     Past Surgical History:  Procedure Laterality Date  . CERVICAL POLYPECTOMY    . CHOLECYSTECTOMY    . KIDNEY SURGERY    . LAPAROSCOPIC NEPHRECTOMY  2010.     Dr. Angeline Slim  at [facility] for large stone  . left knee surgery    . Removal of basil cell carcinoma      Family History  Problem Relation Age of Onset  . Bipolar disorder Mother   . Heart attack Mother        MI in her late 68s  . Bipolar disorder Sister   . Cancer Sister        skin  . Dementia Brother   . Cancer Cousin        breast  . Hypertension Brother   . Cancer Sister        breast,brain,cervical ovarian  . Cancer Maternal Aunt        breast  . Anemia Neg Hx   .  Arrhythmia Neg Hx   . Asthma Neg Hx   . Clotting disorder Neg Hx   . Fainting Neg Hx   . Heart disease Neg Hx   . Heart failure Neg Hx   . Hyperlipidemia Neg Hx     Social History   Tobacco Use  . Smoking status: Never Smoker  . Smokeless tobacco: Never Used  Substance Use Topics  . Alcohol use: Yes    Alcohol/week: 0.0 standard drinks    Comment: Occasional wine  . Drug use: No    ROS   Objective:   Vitals: BP (!) 154/88 (BP Location: Right Arm)   Pulse 75   Temp 99.2 F (37.3 C) (Tympanic)   Ht 5\' 4"  (1.626 m)   Wt 170 lb (77.1 kg)   SpO2 99%   BMI 29.18 kg/m   Physical Exam Constitutional:      General: She is not in acute distress.    Appearance: Normal appearance. She is well-developed. She is not ill-appearing, toxic-appearing or diaphoretic.  HENT:     Head: Normocephalic and atraumatic.     Nose: Nose normal.     Mouth/Throat:     Mouth: Mucous membranes are moist.     Pharynx: Oropharynx is clear.  Eyes:     General: No scleral icterus.       Right eye: No  discharge.        Left eye: No discharge.     Extraocular Movements: Extraocular movements intact.     Conjunctiva/sclera: Conjunctivae normal.     Pupils: Pupils are equal, round, and reactive to light.  Cardiovascular:     Rate and Rhythm: Normal rate.  Pulmonary:     Effort: Pulmonary effort is normal.  Abdominal:     Tenderness: There is no right CVA tenderness or left CVA tenderness.  Skin:    General: Skin is warm and dry.  Neurological:     General: No focal deficit present.     Mental Status: She is alert and oriented to person, place, and time.  Psychiatric:        Behavior: Behavior normal.        Thought Content: Thought content normal.        Judgment: Judgment normal.     Comments: Anxious demeanor.     Results for orders placed or performed during the hospital encounter of 07/24/20 (from the past 24 hour(s))  POCT urinalysis dipstick     Status: Abnormal   Collection Time: 07/24/20  3:26 PM  Result Value Ref Range   Color, UA light yellow (A) yellow   Clarity, UA cloudy (A) clear   Glucose, UA negative negative mg/dL   Bilirubin, UA negative negative   Ketones, POC UA negative negative mg/dL   Spec Grav, UA <=1.005 (A) 1.010 - 1.025   Blood, UA trace-lysed (A) negative   pH, UA 6.5 5.0 - 8.0   Protein Ur, POC negative negative mg/dL   Urobilinogen, UA 0.2 0.2 or 1.0 E.U./dL   Nitrite, UA Negative Negative   Leukocytes, UA Large (3+) (A) Negative    Assessment and Plan :   PDMP not reviewed this encounter.  1. Acute cystitis with hematuria   2. Dysuria     Start Keflex to cover for acute cystitis, urine culture pending.  Recommended aggressive hydration, limiting urinary irritants.  Follow-up closely with urologist.  Counseled patient on potential for adverse effects with medications prescribed/recommended today, ER and return-to-clinic precautions discussed, patient verbalized understanding.  Jaynee Eagles, [place] 07/24/20 1548

## 2020-07-25 LAB — URINE CULTURE
MICRO NUMBER:: 11111940
SPECIMEN QUALITY:: ADEQUATE

## 2020-07-26 ENCOUNTER — Ambulatory Visit (INDEPENDENT_AMBULATORY_CARE_PROVIDER_SITE_OTHER): Payer: Medicare Other | Admitting: Family Medicine

## 2020-07-26 ENCOUNTER — Telehealth: Payer: Self-pay | Admitting: Emergency Medicine

## 2020-07-26 ENCOUNTER — Encounter: Payer: Self-pay | Admitting: Family Medicine

## 2020-07-26 ENCOUNTER — Other Ambulatory Visit: Payer: Self-pay

## 2020-07-26 VITALS — BP 148/74 | HR 75 | Wt 160.0 lb

## 2020-07-26 DIAGNOSIS — R8290 Unspecified abnormal findings in urine: Secondary | ICD-10-CM

## 2020-07-26 DIAGNOSIS — R300 Dysuria: Secondary | ICD-10-CM | POA: Diagnosis not present

## 2020-07-26 DIAGNOSIS — R109 Unspecified abdominal pain: Secondary | ICD-10-CM

## 2020-07-26 LAB — POCT URINALYSIS DIP (CLINITEK)
Bilirubin, UA: NEGATIVE
Blood, UA: NEGATIVE
Glucose, UA: NEGATIVE mg/dL
Ketones, POC UA: NEGATIVE mg/dL
Leukocytes, UA: NEGATIVE
Nitrite, UA: NEGATIVE
POC PROTEIN,UA: NEGATIVE
Spec Grav, UA: 1.01 (ref 1.010–1.025)
Urobilinogen, UA: 0.2 E.U./dL
pH, UA: 6 (ref 5.0–8.0)

## 2020-07-26 MED ORDER — SULFAMETHOXAZOLE-TRIMETHOPRIM 800-160 MG PO TABS: 1.0000 | ORAL_TABLET | Freq: Two times a day (BID) | ORAL | 0 refills | Status: AC

## 2020-07-26 NOTE — Telephone Encounter (Signed)
Call back to patient - no change in medicine at this time - pt has an appointment w/ her PCP later on today & is also making an appointment w/ her urologist.

## 2020-07-26 NOTE — Assessment & Plan Note (Signed)
She doesn't feel like symptoms are improving with cephalexin.  Repeat culture and change to bactrim.  Renal US ordered to evaluate for possible stone/hydronephrosis.

## 2020-07-26 NOTE — Progress Notes (Signed)
[place] Autumn Garcia - 59 y.o. female MRN [id number]  Date of birth: 1961-01-12  Subjective Chief Complaint  Patient presents with  . UC Follow-up    HPI Autumn Garcia is a 59 y.o. female here today for follow up of recent urgent care visit.  She was seen on 10/23 with complaint of dysuria, frequency and urgency.  Diagnosed with cystitis and started on cephalexin.  She has history of kidney stones and has some symptoms similar to when she had last stone including feeling fatigued and a little nauseous.  She denies flank pain but didn't really have pain with previous stone. She denies fever, chills, hematuria.  She doesn't feel like she has had improvement with cephalexin.  Recent culture with <10000 Cfu of gram positive organism.   ROS:  A comprehensive ROS was completed and negative except as noted per HPI  Allergies  Allergen Reactions  . Demerol [Meperidine] Nausea And Vomiting, Nausea Only and Other (See Comments)    seizures  . Latex Rash  . Nickel     infections  . Ciprofloxacin     REACTION: sucidal  . Citrus     Geographic tongue  . Codeine     REACTION: Halluncination  . Erythromycin     REACTION: Vomiting  . Lactose Intolerance (Gi)     GI upset  . Lidocaine Other (See Comments)    Pt unsure if allergic. Last reaction at the dentist and had a seizure, but could have been injected into a vein  . Meperidine Hcl     REACTION: Muscle weakness, vomiting  . Peanut Oil     Other reaction(s): Other (See Comments) ORAL ULCERS  . Soy Allergy     Other reaction(s): Other (See Comments) MOUTH ULCER    Past Medical History:  Diagnosis Date  . Anxiety   . Chronic kidney disease   . Depression   . Eating disorder   . Epilepsy ([place])   . HSV infection   . Kidney stones   . Murmur   . PTSD (post-traumatic stress disorder)   . Skin cancer of forehead    If could be a more serious cancer and will not know until after surgery  . TB (tuberculosis)    Tested  positive and treated    Past Surgical History:  Procedure Laterality Date  . CERVICAL POLYPECTOMY    . CHOLECYSTECTOMY    . KIDNEY SURGERY    . LAPAROSCOPIC NEPHRECTOMY  2010.     Dr. Angeline Slim  at [facility] for large stone  . left knee surgery    . Removal of basil cell carcinoma      Social History   Socioeconomic History  . Marital status: Married    Spouse name: Autumn Garcia  . Number of children: 0  . Years of education: Not on file  . Highest education level: Not on file  Occupational History  . Occupation: disability    Employer: UNEMPLOYED  Tobacco Use  . Smoking status: Never Smoker  . Smokeless tobacco: Never Used  Substance and Sexual Activity  . Alcohol use: Yes    Alcohol/week: 0.0 standard drinks    Comment: Occasional wine  . Drug use: No  . Sexual activity: Yes    Partners: Male    Birth control/protection: Post-menopausal  Other Topics Concern  . Not on file  Social History Narrative   1 caffeinated drink per day. Does yoga for  60 minutes 3 days per week.   Social Determinants of  Health   Financial Resource Strain:   . Difficulty of Paying Living Expenses: Not on file  Food Insecurity:   . Worried About Charity fundraiser in the Last Year: Not on file  . Ran Out of Food in the Last Year: Not on file  Transportation Needs:   . Lack of Transportation (Medical): Not on file  . Lack of Transportation (Non-Medical): Not on file  Physical Activity:   . Days of Exercise per Week: Not on file  . Minutes of Exercise per Session: Not on file  Stress:   . Feeling of Stress : Not on file  Social Connections:   . Frequency of Communication with Friends and Family: Not on file  . Frequency of Social Gatherings with Friends and Family: Not on file  . Attends Religious Services: Not on file  . Active Member of Clubs or Organizations: Not on file  . Attends Archivist Meetings: Not on file  . Marital Status: Not on file    Family History  Problem  Relation Age of Onset  . Bipolar disorder Mother   . Heart attack Mother        MI in her late 47s  . Bipolar disorder Sister   . Cancer Sister        skin  . Dementia Brother   . Cancer Cousin        breast  . Hypertension Brother   . Cancer Sister        breast,brain,cervical ovarian  . Cancer Maternal Aunt        breast  . Anemia Neg Hx   . Arrhythmia Neg Hx   . Asthma Neg Hx   . Clotting disorder Neg Hx   . Fainting Neg Hx   . Heart disease Neg Hx   . Heart failure Neg Hx   . Hyperlipidemia Neg Hx     Health Maintenance  Topic Date Due  . TETANUS/TDAP  10/02/2018  . INFLUENZA VACCINE  Never done  . PAP SMEAR-Modifier  03/02/2021 (Originally 04/25/2018)  . Fecal DNA (Cologuard)  03/03/2021 (Originally 07/09/2010)  . COVID-19 Vaccine (1) 03/18/2021 (Originally 07/09/1972)  . MAMMOGRAM  04/21/2022  . Hepatitis C Screening  Completed  . HIV Screening  Completed     ----------------------------------------------------------------------------------------------------------------------------------------------------------------------------------------------------------------- Physical Exam BP (!) 148/74 (BP Location: Left Arm, Patient Position: Sitting, Cuff Size: Large)   Pulse 75   Wt 160 lb (72.6 kg)   SpO2 99%   BMI 27.46 kg/m   Physical Exam Constitutional:      Appearance: Normal appearance.  Cardiovascular:     Rate and Rhythm: Normal rate and regular rhythm.  Pulmonary:     Effort: Pulmonary effort is normal.     Breath sounds: Normal breath sounds.  Abdominal:     General: Abdomen is flat. There is no distension.     Palpations: Abdomen is soft.     Tenderness: There is no abdominal tenderness. There is no right CVA tenderness or left CVA tenderness.  Neurological:     General: No focal deficit present.     Mental Status: She is alert.  Psychiatric:        Mood and Affect: Mood normal.        Behavior: Behavior normal.      ------------------------------------------------------------------------------------------------------------------------------------------------------------------------------------------------------------------- Assessment and Plan  Dysuria She doesn't feel like symptoms are improving with cephalexin.  Repeat culture and change to bactrim.  Renal US ordered to evaluate for possible stone/hydronephrosis.  Meds ordered this encounter  Medications  . sulfamethoxazole-trimethoprim (BACTRIM DS) 800-160 MG tablet    Sig: Take 1 tablet by mouth 2 (two) times daily for 7 days.    Dispense:  14 tablet    Refill:  0    No follow-ups on file.    This visit occurred during the SARS-CoV-2 public health emergency.  Safety protocols were in place, including screening questions prior to the visit, additional usage of staff PPE, and extensive cleaning of exam room while observing appropriate contact time as indicated for disinfecting solutions.

## 2020-07-26 NOTE — Patient Instructions (Signed)
Start bactrim in place of cephalexin.  I have ordered a renal ultrasound.  Keep appointment with urology as well.

## 2020-07-27 ENCOUNTER — Ambulatory Visit (INDEPENDENT_AMBULATORY_CARE_PROVIDER_SITE_OTHER): Payer: Medicare Other

## 2020-07-27 DIAGNOSIS — R8290 Unspecified abnormal findings in urine: Secondary | ICD-10-CM | POA: Diagnosis not present

## 2020-07-27 DIAGNOSIS — R109 Unspecified abdominal pain: Secondary | ICD-10-CM

## 2020-07-27 IMAGING — US US RENAL
1 series · 14 of 21 positions shown · non-contrast
Comparison: CT [DATE]

CLINICAL DATA: Abnormal urinalysis history of right nephrectomy
left flank pain

EXAM:
RENAL / URINARY TRACT ULTRASOUND COMPLETE

[Series 1: us renal · 0.25mm/px · 21 acquisitions, 14 frames shown]
[im 1/21]
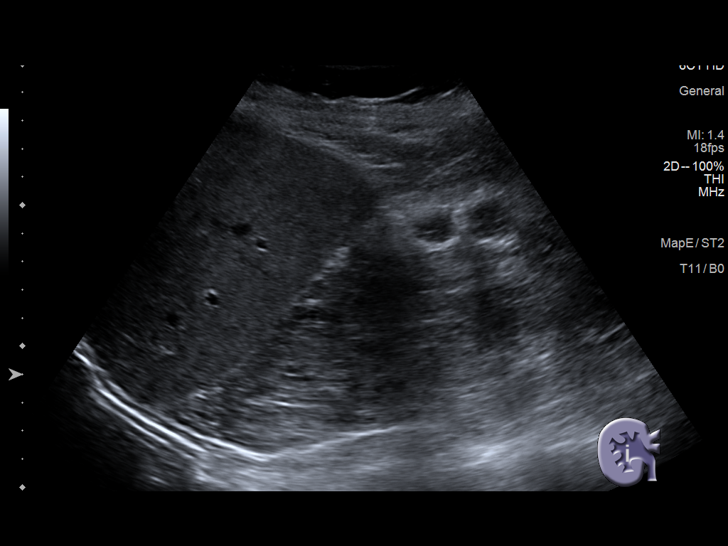
[im 3/21]
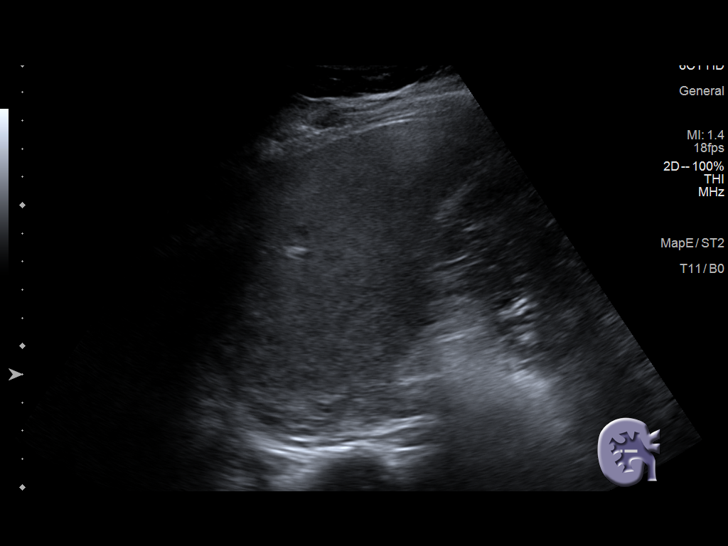
[im 4/21]
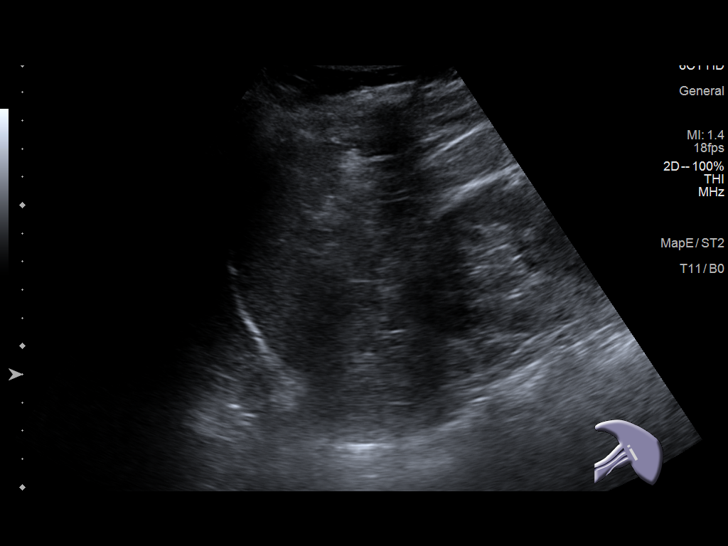
[im 6/21]
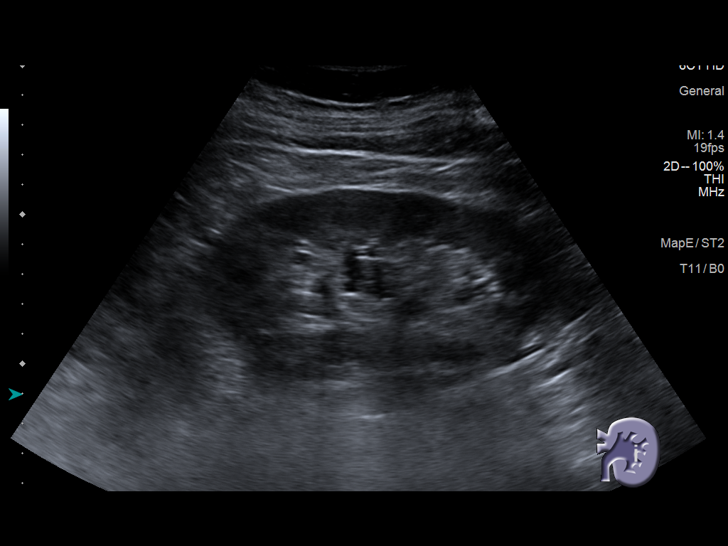
[im 7/21]
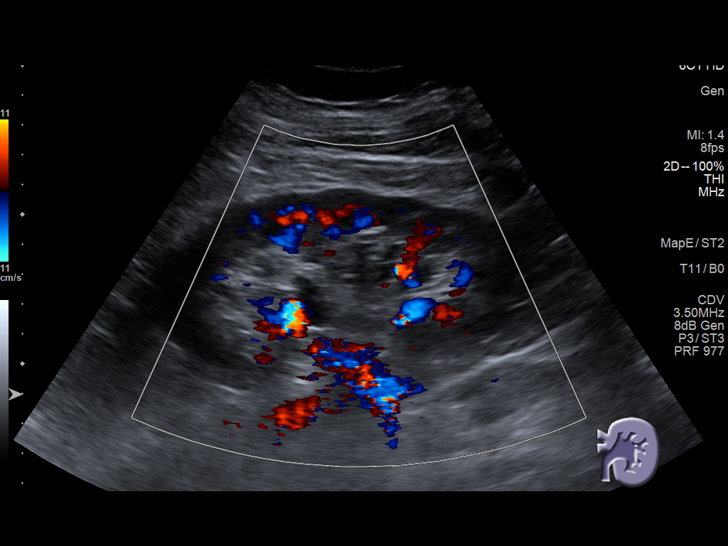
[im 9/21]
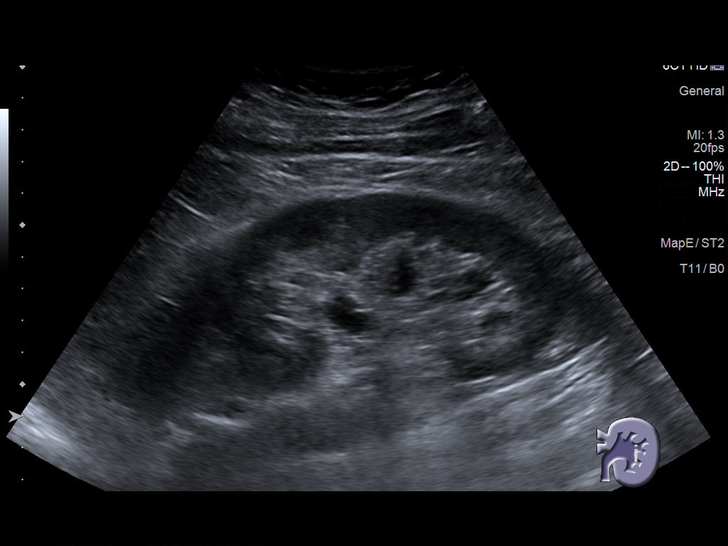
[im 10/21]
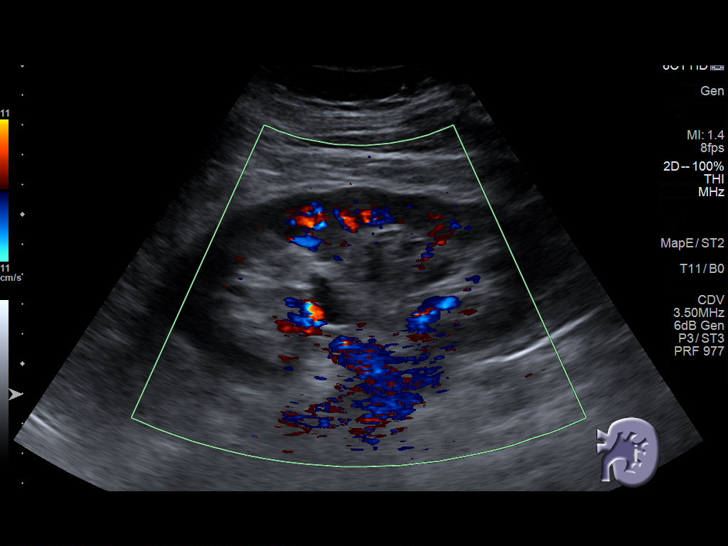
[im 12/21]
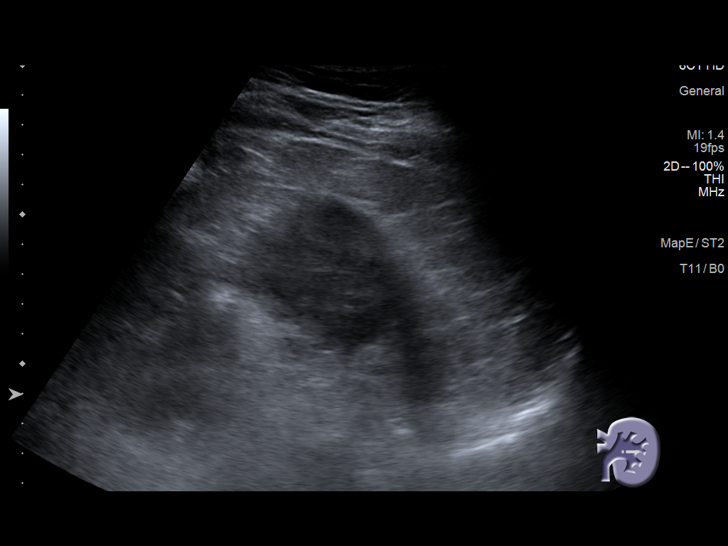
[im 13/21]
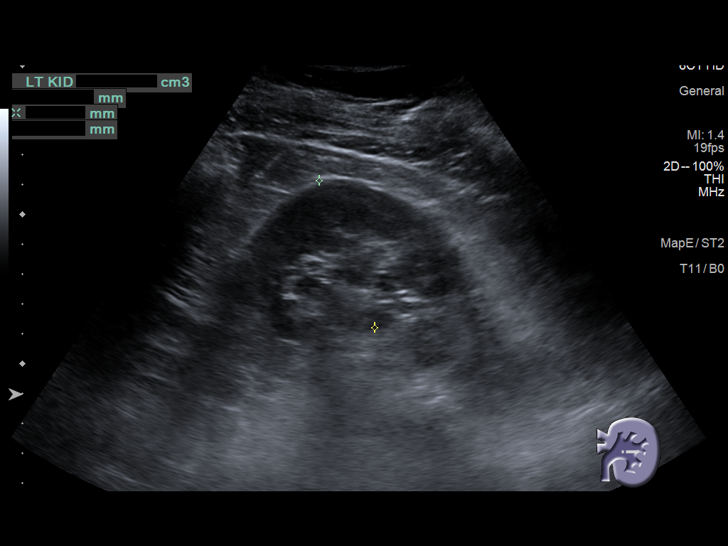
[im 15/21]
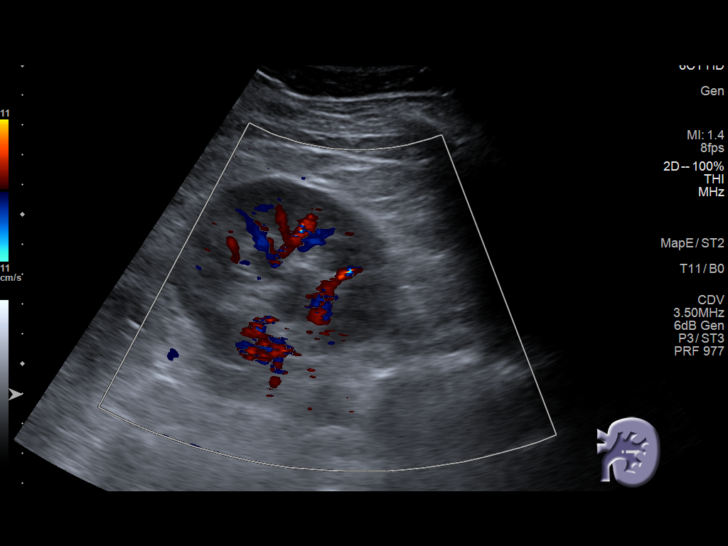
[im 16/21]
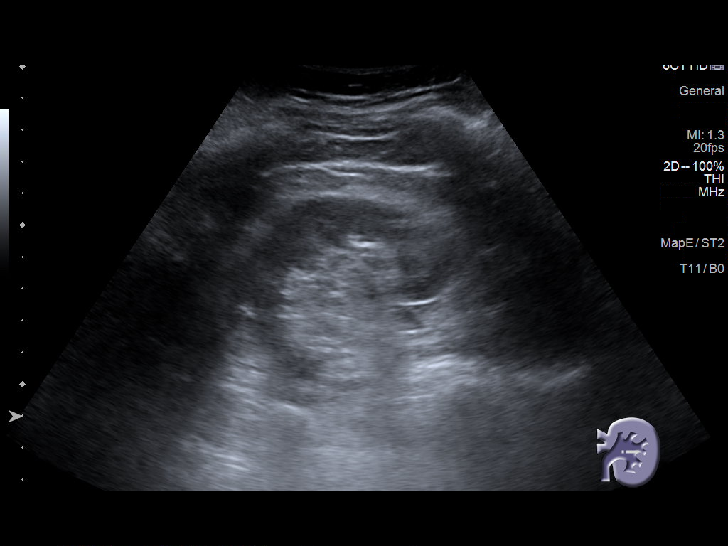
[im 18/21]
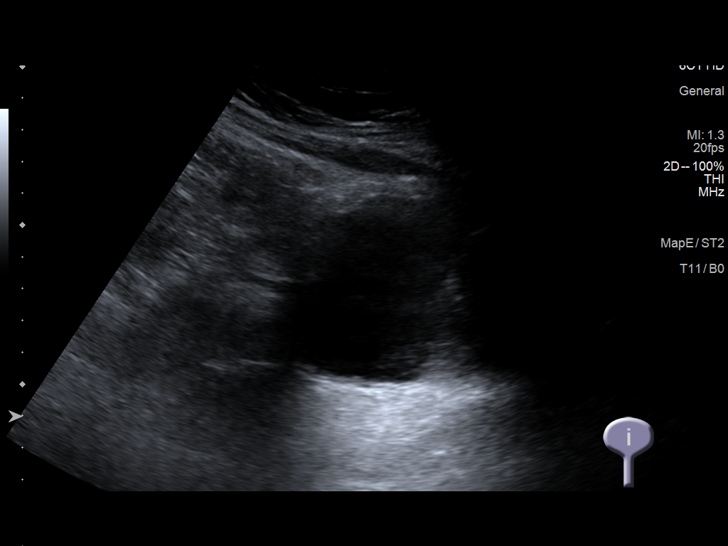
[im 19/21]
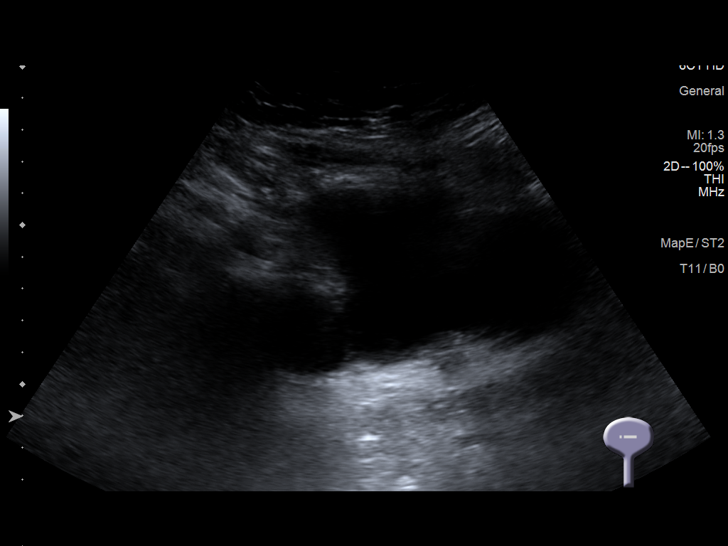
[im 21/21]
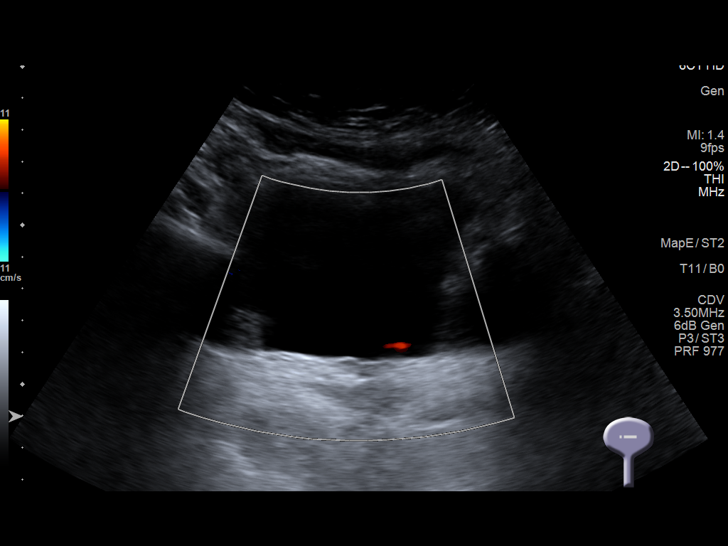

[14 of 21 positions shown; findings below may reference images not displayed]

FINDINGS: Right Kidney:

Status post nephrectomy

Left Kidney:

Renal measurements: 12 x 5.9 x 5.3 cm = volume: 194.2 mL. Cortical
echogenicity is normal. Negative for renal mass. Mild prominence of
the left renal pelvis without definitive hydronephrosis. Suspected
parapelvic cysts

Bladder:

Appears normal for degree of bladder distention.

Other:

None.
IMPRESSION: 1. Status post right nephrectomy.
2. No convincing hydronephrosis on the left. Suspect that there are
parapelvic cysts. CT KUB may be pursued if persistent concern for
obstruction.

## 2020-07-28 LAB — URINE CULTURE
MICRO NUMBER:: 11117033
Result:: NO GROWTH
SPECIMEN QUALITY:: ADEQUATE

## 2020-07-29 DIAGNOSIS — N200 Calculus of kidney: Secondary | ICD-10-CM | POA: Diagnosis not present

## 2020-07-29 DIAGNOSIS — N2889 Other specified disorders of kidney and ureter: Secondary | ICD-10-CM | POA: Diagnosis not present

## 2020-08-02 ENCOUNTER — Other Ambulatory Visit: Payer: Self-pay | Admitting: Family Medicine

## 2020-08-02 NOTE — Progress Notes (Signed)
Chart reviewed and she was seen by her urologist on 10/28 and had another [place] and KUB which were unremarkable.  I would only recommend that she have the CT if symptoms persist to avoid exposing her to unnecessary radiation.

## 2020-08-12 DIAGNOSIS — M6281 Muscle weakness (generalized): Secondary | ICD-10-CM | POA: Diagnosis not present

## 2020-08-12 DIAGNOSIS — R102 Pelvic and perineal pain: Secondary | ICD-10-CM | POA: Diagnosis not present

## 2020-08-12 DIAGNOSIS — N9411 Superficial (introital) dyspareunia: Secondary | ICD-10-CM | POA: Diagnosis not present

## 2020-08-12 DIAGNOSIS — M62838 Other muscle spasm: Secondary | ICD-10-CM | POA: Diagnosis not present

## 2020-08-25 ENCOUNTER — Encounter: Payer: Self-pay | Admitting: Medical-Surgical

## 2020-08-25 ENCOUNTER — Ambulatory Visit (INDEPENDENT_AMBULATORY_CARE_PROVIDER_SITE_OTHER): Payer: Medicare Other | Admitting: Medical-Surgical

## 2020-08-25 VITALS — BP 145/77 | HR 71 | Temp 98.5°F

## 2020-08-25 DIAGNOSIS — N3001 Acute cystitis with hematuria: Secondary | ICD-10-CM | POA: Diagnosis not present

## 2020-08-25 DIAGNOSIS — R300 Dysuria: Secondary | ICD-10-CM

## 2020-08-25 LAB — POCT URINALYSIS DIP (CLINITEK)
Bilirubin, UA: NEGATIVE
Glucose, UA: NEGATIVE mg/dL
Ketones, POC UA: NEGATIVE mg/dL
Nitrite, UA: NEGATIVE
POC PROTEIN,UA: NEGATIVE
Spec Grav, UA: 1.01 (ref 1.010–1.025)
Urobilinogen, UA: 0.2 E.U./dL
pH, UA: 6.5 (ref 5.0–8.0)

## 2020-08-25 MED ORDER — AMOXICILLIN 500 MG PO CAPS
500.0000 mg | ORAL_CAPSULE | Freq: Three times a day (TID) | ORAL | 0 refills | Status: DC
Start: 2020-08-25 — End: 2020-10-19

## 2020-08-25 NOTE — Progress Notes (Signed)
Subjective:    CC: Dysuria  HPI: Pleasant 59 year old female accompanied by her husband presenting today with complaints dysuria that started yesterday.  She has a history of intermittent UTIs and is exceptionally concerned due to having only one kidney.  She has been to see urology in the past and call them today but was unable to get an appointment.  She notes that she has not had any urinary odor or hematuria but she does have a constant feeling of urgency and is going to the restroom more frequently.  No fevers, chills, back pain, or suprapubic pressure.  Notes that she was nauseated last night but her husband reports he was as well and they suspect it was related to the takeout food that he ate.  I reviewed the past medical history, family history, social history, surgical history, and allergies today and no changes were needed.  Please see the problem list section below in epic for further details.  Past Medical History: Past Medical History:  Diagnosis Date  . Anxiety   . Chronic kidney disease   . Depression   . Eating disorder   . Epilepsy ([place])   . HSV infection   . Kidney stones   . Murmur   . PTSD (post-traumatic stress disorder)   . Skin cancer of forehead    If could be a more serious cancer and will not know until after surgery  . TB (tuberculosis)    Tested positive and treated   Past Surgical History: Past Surgical History:  Procedure Laterality Date  . CERVICAL POLYPECTOMY    . CHOLECYSTECTOMY    . KIDNEY SURGERY    . LAPAROSCOPIC NEPHRECTOMY  2010.     Dr. Angeline Slim  at [facility] for large stone  . left knee surgery    . Removal of basil cell carcinoma     Social History: Social History   Socioeconomic History  . Marital status: Married    Spouse name: [name]  . Number of children: 0  . Years of education: Not on file  . Highest education level: Not on file  Occupational History  . Occupation: disability    Employer: UNEMPLOYED  Tobacco Use  . Smoking  status: Never Smoker  . Smokeless tobacco: Never Used  Substance and Sexual Activity  . Alcohol use: Yes    Alcohol/week: 0.0 standard drinks    Comment: Occasional wine  . Drug use: No  . Sexual activity: Yes    Partners: Male    Birth control/protection: Post-menopausal  Other Topics Concern  . Not on file  Social History Narrative   1 caffeinated drink per day. Does yoga for  60 minutes 3 days per week.   Social Determinants of Health   Financial Resource Strain:   . Difficulty of Paying Living Expenses: Not on file  Food Insecurity:   . Worried About Charity fundraiser in the Last Year: Not on file  . Ran Out of Food in the Last Year: Not on file  Transportation Needs:   . Lack of Transportation (Medical): Not on file  . Lack of Transportation (Non-Medical): Not on file  Physical Activity:   . Days of Exercise per Week: Not on file  . Minutes of Exercise per Session: Not on file  Stress:   . Feeling of Stress : Not on file  Social Connections:   . Frequency of Communication with Friends and Family: Not on file  . Frequency of Social Gatherings with Friends and Family: Not  on file  . Attends Religious Services: Not on file  . Active Member of Clubs or Organizations: Not on file  . Attends Archivist Meetings: Not on file  . Marital Status: Not on file   Family History: Family History  Problem Relation Age of Onset  . Bipolar disorder Mother   . Heart attack Mother        MI in her late 29s  . Bipolar disorder Sister   . Cancer Sister        skin  . Dementia Brother   . Cancer Cousin        breast  . Hypertension Brother   . Cancer Sister        breast,brain,cervical ovarian  . Cancer Maternal Aunt        breast  . Anemia Neg Hx   . Arrhythmia Neg Hx   . Asthma Neg Hx   . Clotting disorder Neg Hx   . Fainting Neg Hx   . Heart disease Neg Hx   . Heart failure Neg Hx   . Hyperlipidemia Neg Hx    Allergies: Allergies  Allergen Reactions  .  Demerol [Meperidine] Nausea And Vomiting, Nausea Only and Other (See Comments)    seizures  . Latex Rash  . Nickel     infections  . Ciprofloxacin     REACTION: sucidal  . Citrus     Geographic tongue  . Codeine     REACTION: Halluncination  . Erythromycin     REACTION: Vomiting  . Lactose Intolerance (Gi)     GI upset  . Lidocaine Other (See Comments)    Pt unsure if allergic. Last reaction at the dentist and had a seizure, but could have been injected into a vein  . Meperidine Hcl     REACTION: Muscle weakness, vomiting  . Peanut Oil     Other reaction(s): Other (See Comments) ORAL ULCERS  . Soy Allergy     Other reaction(s): Other (See Comments) MOUTH ULCER   Medications: See med rec.  Review of Systems: See HPI for pertinent positives and negatives.   Objective:    General: Well Developed, well nourished, and in no acute distress.  Neuro: Alert and oriented x3. HEENT: Normocephalic, atraumatic.   Skin: Warm and dry. Cardiac: Regular rate and rhythm, no murmurs rubs or gallops, no lower extremity edema.  Respiratory: Clear to auscultation bilaterally. Not using accessory muscles, speaking in full sentences. Abdomen: Soft, nontender, nondistended. Bowel sounds + x 4 quadrants. No HSM appreciated.  Impression and Recommendations:    1. Dysuria POCT urinalysis positive for small amount of blood and moderate leukocytes.  Negative for nitrites and protein.  Sending sample for culture. - POCT URINALYSIS DIP (CLINITEK) - Urine Culture  2. Acute cystitis with hematuria Patient reports she has had good results with amoxicillin for previous urinary tract infections.  Sending in amoxicillin 500 mg every 8 hours x7 days.  When culture results, advised patient we may need to change this to a different medication.  She and her husband verbalized understanding and are agreeable to the plan.  Return if symptoms worsen or fail to improve.   ___________________________________________ Clearnce Sorrel, DNP, APRN, FNP-BC [facility] [place]

## 2020-08-27 LAB — URINE CULTURE
MICRO NUMBER:: 11246587
SPECIMEN QUALITY:: ADEQUATE

## 2020-08-30 ENCOUNTER — Encounter: Payer: Self-pay | Admitting: Medical-Surgical

## 2020-08-30 DIAGNOSIS — R102 Pelvic and perineal pain: Secondary | ICD-10-CM | POA: Diagnosis not present

## 2020-08-30 DIAGNOSIS — M62838 Other muscle spasm: Secondary | ICD-10-CM | POA: Diagnosis not present

## 2020-08-30 DIAGNOSIS — M6281 Muscle weakness (generalized): Secondary | ICD-10-CM | POA: Diagnosis not present

## 2020-08-30 DIAGNOSIS — N9411 Superficial (introital) dyspareunia: Secondary | ICD-10-CM | POA: Diagnosis not present

## 2020-09-02 DIAGNOSIS — R399 Unspecified symptoms and signs involving the genitourinary system: Secondary | ICD-10-CM | POA: Diagnosis not present

## 2020-10-07 ENCOUNTER — Telehealth: Payer: Self-pay | Admitting: Podiatry

## 2020-10-07 NOTE — Telephone Encounter (Signed)
Pt left message regarding charges for orthotics that she did not get.  upon looking the husband canceled the order and the charges were to be taken out. I am not sure what happened but I had our billing dept take them out and verified that they were. I apologized to pt and told her the charges were voided.

## 2020-10-12 ENCOUNTER — Emergency Department (INDEPENDENT_AMBULATORY_CARE_PROVIDER_SITE_OTHER)
Admission: EM | Admit: 2020-10-12 | Discharge: 2020-10-12 | Disposition: A | Discharge status: Home / Self Care | Payer: Medicare Other | Source: Home / Self Care

## 2020-10-12 ENCOUNTER — Other Ambulatory Visit: Payer: Self-pay

## 2020-10-12 DIAGNOSIS — K529 Noninfective gastroenteritis and colitis, unspecified: Secondary | ICD-10-CM | POA: Diagnosis not present

## 2020-10-12 DIAGNOSIS — R300 Dysuria: Secondary | ICD-10-CM | POA: Diagnosis not present

## 2020-10-12 DIAGNOSIS — R112 Nausea with vomiting, unspecified: Secondary | ICD-10-CM

## 2020-10-12 LAB — POCT URINALYSIS DIP (MANUAL ENTRY)
Bilirubin, UA: NEGATIVE
Glucose, UA: NEGATIVE mg/dL
Leukocytes, UA: NEGATIVE
Nitrite, UA: NEGATIVE
Protein Ur, POC: NEGATIVE mg/dL
Spec Grav, UA: 1.015 (ref 1.010–1.025)
Urobilinogen, UA: 0.2 E.U./dL
pH, UA: 6.5 (ref 5.0–8.0)

## 2020-10-12 MED ORDER — PROMETHAZINE HCL 25 MG PO TABS
25.0000 mg | ORAL_TABLET | Freq: Four times a day (QID) | ORAL | 0 refills | Status: DC | PRN
Start: 2020-10-12 — End: 2020-10-19

## 2020-10-12 NOTE — ED Provider Notes (Signed)
[facility]    CSN: [id number] Arrival date & time: 10/12/20  1402      History   Chief Complaint Chief Complaint  Patient presents with  . Nausea    HPI Autumn Garcia is a 60 y.o. female.   HPI Patient presents with poor appetite, nausea, with vomitus x 2 episodes, and fatigue. Patient unaware of fever. Low grade fever on arrival. No known sick contacts. Tolerating certain beverages. Has history of recurrent UTI due to one kidney initially though symptoms may be related to UTI. Denies dysuria. Denies abdominal pain, endorses upset stomach sensation. fatigue causing excessive sleeping. She has experienced dizziness. Trying to increase oral hydration. Past Medical History:  Diagnosis Date  . Anxiety   . Chronic kidney disease   . Depression   . Eating disorder   . Epilepsy (HCC)   . HSV infection   . Kidney stones   . Murmur   . PTSD (post-traumatic stress disorder)   . Skin cancer of forehead    If could be a more serious cancer and will not know until after surgery  . TB (tuberculosis)    Tested positive and treated    Patient Active Problem List   Diagnosis Date Noted  . Dysuria 07/26/2020  . Bee allergy status 09/27/2018  . Hyperlipidemia 04/26/2015  . Herpes simplex type II infection 04/26/2014  . Dizziness and giddiness 11/13/2013  . Vitamin D deficiency 10/29/2013  . Basal cell carcinoma of skin 10/29/2013  . Ventral hernia without obstruction or gangrene 01/27/2013  . Dissociative disorder 06/03/2012  . PTSD (post-traumatic stress disorder) 11/15/2011    Class: Chronic  . Dissociative identity disorder (HCC) 10/06/2011    Class: Chronic  . Borderline personality disorder (HCC) 10/03/2011  . HOT FLASHES 09/03/2009  . ANXIETY DISORDER, GENERALIZED 06/14/2009  . KNEE PAIN 01/26/2009  . Other convulsions 01/26/2009    Past Surgical History:  Procedure Laterality Date  . CERVICAL POLYPECTOMY    . CHOLECYSTECTOMY    . KIDNEY  SURGERY    . LAPAROSCOPIC NEPHRECTOMY  2010.     [facility]  . left knee surgery    . Removal of basil cell carcinoma      OB History    Gravida  3   Para  0   Term      Preterm      AB  3   Living  0     SAB  1   IAB  2   Ectopic      Multiple      Live Births               Home Medications    Prior to Admission medications   Medication Sig Start Date End Date Taking? Authorizing Provider  amoxicillin (AMOXIL) 500 MG capsule Take 1 capsule (500 mg total) by mouth 3 (three) times daily. 08/25/20   Christen ButterJessup, Joy, NP  diazepam (VALIUM) 2 MG tablet Take 1 tablet (2 mg total) by mouth every 8 (eight) hours as needed for anxiety. 03/03/20   Agapito GamesMetheney, Catherine D, MD  EPINEPHrine 0.3 mg/0.3 mL IJ SOAJ injection USE AS DIRECTED AND AS NEEDED FOR SEVERE ALLERGIC REACTION 03/02/20   Agapito GamesMetheney, Catherine D, MD  L-Lysine 500 MG TABS Take 500 mg by mouth.    [provider]    Family History Family History  Problem Relation Age of Onset  . Bipolar disorder Mother   . Heart  attack Mother        MI in her late 38s  . Bipolar disorder Sister   . Cancer Sister        skin  . Dementia Brother   . Cancer Cousin        breast  . Hypertension Brother   . Cancer Sister        breast,brain,cervical ovarian  . Cancer Maternal Aunt        breast  . Anemia Neg Hx   . Arrhythmia Neg Hx   . Asthma Neg Hx   . Clotting disorder Neg Hx   . Fainting Neg Hx   . Heart disease Neg Hx   . Heart failure Neg Hx   . Hyperlipidemia Neg Hx     Social History Social History   Tobacco Use  . Smoking status: Never Smoker  . Smokeless tobacco: Never Used  Substance Use Topics  . Alcohol use: Yes    Alcohol/week: 0.0 standard drinks    Comment: Occasional wine  . Drug use: No     Allergies   Demerol [meperidine], Latex, Nickel, Ciprofloxacin, Citrus, Codeine, Erythromycin, Lactose intolerance (gi), Lidocaine, Meperidine hcl, Peanut oil, and Soy  allergy   Review of Systems Review of Systems Pertinent negatives listed in HPI Physical Exam Triage Vital Signs ED Triage Vitals  Enc Vitals Group     BP 10/12/20 1612 (!) 166/83     Pulse Rate 10/12/20 1612 83     Resp 10/12/20 1612 18     Temp 10/12/20 1612 99.1 F (37.3 C)     Temp Source 10/12/20 1612 Oral     SpO2 10/12/20 1612 100 %     Weight --      Height --      Head Circumference --      Peak Flow --      Pain Score 10/12/20 1609 0     Pain Loc --      Pain Edu? --      Excl. in [place]? --    No data found.  Updated Vital Signs BP (!) 166/83 (BP Location: Right Arm)   Pulse 83   Temp 99.1 F (37.3 C) (Oral)   Resp 18   SpO2 100%   Visual Acuity Right Eye Distance:   Left Eye Distance:   Bilateral Distance:    Right Eye Near:   Left Eye Near:    Bilateral Near:     Physical Exam General appearance: alert, acutely ill appearing,  cooperative and in no distress Head: Normocephalic, without obvious abnormality, atraumatic Respiratory: Respirations even and unlabored, normal respiratory rate Heart: rate and rhythm normal. No gallop or murmurs noted on exam  Abdomen: BS +, no distention, no rebound tenderness, no CVA tenderness  Extremities: No gross deformities Skin: Skin color, texture, turgor normal. No rashes seen  Psych: Appropriate mood and affect. Neurologic: Mental status: Alert, oriented to person, place, and time, thought content appropriate.  UC Treatments / Results  Labs (all labs ordered are listed, but only abnormal results are displayed) Labs Reviewed  POCT URINALYSIS DIP (MANUAL ENTRY) - Abnormal; Notable for the following components:      Result Value   Ketones, POC UA small (15) (*)    Blood, UA trace-intact (*)    All other components within normal limits    EKG   Radiology No results found.  Procedures Procedures (including critical care time)  Medications Ordered in UC Medications - No data to  display  Initial  Impression / Assessment and Plan / UC Course  I have reviewed the triage vital signs and the nursing notes.  Pertinent labs & imaging results that were available during my care of the patient were reviewed by me and considered in my medical decision making (see chart for details).    UA unremarkable for UTI, urine culture pending. Will hold off on treatment for UTI until culture results. Treatment for gastroenteritis per discharge discharge medications. COVID test pending. Strict ER precaution given. Final Clinical Impressions(s) / UC Diagnoses   Final diagnoses:  Dysuria  Gastroenteritis  Nausea and vomiting, intractability of vomiting not specified, unspecified vomiting type     Discharge Instructions     Your COVID 19 results should result within 3-5 days. Negative results are immediately resulted to Mychart. Positive results will receive a follow-up call from our clinic. If symptoms are present, I recommend home quarantine until results are known.  Alternate Tylenol and ibuprofen as needed for body aches and fever.  Symptom management per recommendations discussed today. Promethazine for nausea. If any breathing difficulty or chest pain develops go immediately to the closest emergency department for evaluation.       ED Prescriptions    Medication Sig Dispense Auth. Provider   promethazine (PHENERGAN) 25 MG tablet Take 1 tablet (25 mg total) by mouth every 6 (six) hours as needed for nausea or vomiting. 12 tablet Scot Jun, FNP     PDMP not reviewed this encounter.   Scot Jun, FNP 10/15/20 [phone]

## 2020-10-12 NOTE — ED Notes (Signed)
Initial urine culture order requisition did not print - provider updated - reordered - pt should only be charged for one urine culture

## 2020-10-12 NOTE — Discharge Instructions (Signed)
Your COVID 19 results should result within 3-5 days. Negative results are immediately resulted to Mychart. Positive results will receive a follow-up call from our clinic. If symptoms are present, I recommend home quarantine until results are known.  Alternate Tylenol and ibuprofen as needed for body aches and fever.  Symptom management per recommendations discussed today. Promethazine for nausea. If any breathing difficulty or chest pain develops go immediately to the closest emergency department for evaluation.

## 2020-10-12 NOTE — ED Triage Notes (Signed)
Pt c/o nausea and vomiting (2 episodes, total) since night before last. Says she feels somewhat feverish with a headache. Diarrhea this am. No known covid exposure. Had one moderna vaccine last year. No OTC meds tried.  Also wants urine checked as she only has one kidney.

## 2020-10-13 LAB — URINE CULTURE
MICRO NUMBER:: 11407696
SPECIMEN QUALITY:: ADEQUATE

## 2020-10-14 ENCOUNTER — Telehealth: Payer: Self-pay | Admitting: Emergency Medicine

## 2020-10-14 LAB — COVID-19, FLU A+B NAA
Influenza A, NAA: NOT DETECTED
Influenza B, NAA: NOT DETECTED
SARS-CoV-2, NAA: NOT DETECTED

## 2020-10-14 MED ORDER — SULFAMETHOXAZOLE-TRIMETHOPRIM 800-160 MG PO TABS
1.0000 | ORAL_TABLET | Freq: Two times a day (BID) | ORAL | 0 refills | Status: DC
Start: 2020-10-14 — End: 2020-10-14

## 2020-10-14 MED ORDER — SULFAMETHOXAZOLE-TRIMETHOPRIM 800-160 MG PO TABS
1.0000 | ORAL_TABLET | Freq: Two times a day (BID) | ORAL | 0 refills | Status: DC
Start: 2020-10-14 — End: 2020-10-19

## 2020-10-14 NOTE — Telephone Encounter (Signed)
Call back after speaking to Dr Raeford Razor, confirmed pharmacy with patient ([facility] in [facility] on [address]). Bactrim script to be sent in via e-script. Pt stated she will follow up w/PCP in am, also given info for [facility] in [place]. RN thanked for her time

## 2020-10-14 NOTE — Telephone Encounter (Signed)
Bactrim DS resent to [facility] in [facility]

## 2020-10-14 NOTE — Telephone Encounter (Signed)
Call from patient regarding her urine culture results, positive for Streptococcus Agalactiae. Pt had same result in November 2021. Pt did call her urologist today and has an appointment on 11/03/20. Pt is calling today because she only has one kidney & currently has a temp of 101. Pt took amoxicillin in past but is unable to take keflex due to GI upset. Will talk to provider here tonight (Dr Tia Alert) and will forward note to Lavell Anchors, FNP.

## 2020-10-14 NOTE — Telephone Encounter (Signed)
Provided bactrim for patient symptomatic and urine culture similar to results from November.   Rosemarie Ax, MD [facility] 10/14/2020, 8:24 PM

## 2020-10-15 ENCOUNTER — Telehealth: Payer: Self-pay

## 2020-10-15 NOTE — Telephone Encounter (Signed)
Her culture came back positive for just a skin contaminant it did not grow out any true urinary bacteria did so it is difficult to say what will work for her I would recommend that she come back in for repeat urine culture we can place the order and she can do that downstairs at the lab.  Plus it looks like she did not start the antibiotic until yesterday which means she has not given it a full 3 days to see if it is helping or not.

## 2020-10-15 NOTE — Telephone Encounter (Signed)
Autumn Garcia states she was seen in Urgent Care and treated for a UTI. She states they treated her with Bactrim and it isn't helping. She is requesting a PCN antibiotic. Please advise.

## 2020-10-15 NOTE — Telephone Encounter (Signed)
Patient advised. She states she will try the antibiotic first.

## 2020-10-19 ENCOUNTER — Encounter: Payer: Self-pay | Admitting: Family Medicine

## 2020-10-19 ENCOUNTER — Ambulatory Visit (INDEPENDENT_AMBULATORY_CARE_PROVIDER_SITE_OTHER): Payer: Medicare Other | Admitting: Family Medicine

## 2020-10-19 ENCOUNTER — Other Ambulatory Visit: Payer: Self-pay

## 2020-10-19 VITALS — BP 124/84 | HR 74

## 2020-10-19 DIAGNOSIS — R509 Fever, unspecified: Secondary | ICD-10-CM | POA: Diagnosis not present

## 2020-10-19 DIAGNOSIS — N3001 Acute cystitis with hematuria: Secondary | ICD-10-CM | POA: Diagnosis not present

## 2020-10-19 DIAGNOSIS — Z87440 Personal history of urinary (tract) infections: Secondary | ICD-10-CM | POA: Diagnosis not present

## 2020-10-19 LAB — POCT URINALYSIS DIP (CLINITEK)
Bilirubin, UA: NEGATIVE
Blood, UA: NEGATIVE
Glucose, UA: NEGATIVE mg/dL
Ketones, POC UA: NEGATIVE mg/dL
Leukocytes, UA: NEGATIVE
Nitrite, UA: NEGATIVE
POC PROTEIN,UA: NEGATIVE
Spec Grav, UA: <=1.005 — AB (ref 1.010–1.025)
Urobilinogen, UA: 0.2 E.U./dL
pH, UA: 6 (ref 5.0–8.0)

## 2020-10-19 NOTE — Progress Notes (Signed)
Acute Office Visit  Subjective:    Patient ID: Autumn Garcia, female    DOB: 07/28/1961, 60 y.o.   MRN: [id number]  Chief Complaint  Patient presents with  . Urinary Tract Infection    HPI Patient is in today for follow-up UTI.  She was seen in urgent care on January 11 for nausea vomiting and fever.  She was tested for COVID and flu and was negative.  Urine culture was sent and it came back positive for Streptococcus achalasia.  She was treated with Bactrim.  She reports that within a few hours of starting the antibiotics she felt tremendously better she has not had a fever at home since then.  She never experienced any dysuria or pelvic pain or pressure.  She does follow with urology as she has had prior history of calculi, renal cysts, and recurrent UTIs.  She still has approximately 1 day of antibiotics left.  They did do a urine culture which grew out Streptococcus agalactiae.  She was also given a small amount of Phenergan.  She has had multiple UTIs.  She had similar symptoms with similar positive culture results in November.  In October she had a negative urine culture.  And then again another negative urine culture in October.  She does have a urology consultation scheduled for later this month.    Past Medical History:  Diagnosis Date  . Anxiety   . Chronic kidney disease   . Depression   . Eating disorder   . Epilepsy ([place])   . HSV infection   . Kidney stones   . Murmur   . PTSD (post-traumatic stress disorder)   . Skin cancer of forehead    If could be a more serious cancer and will not know until after surgery  . TB (tuberculosis)    Tested positive and treated    Past Surgical History:  Procedure Laterality Date  . CERVICAL POLYPECTOMY    . CHOLECYSTECTOMY    . KIDNEY SURGERY    . LAPAROSCOPIC NEPHRECTOMY  2010.     Dr. Angeline Slim  at [facility] for large stone  . left knee surgery    . Removal of basil cell carcinoma      Family History  Problem Relation  Age of Onset  . Bipolar disorder Mother   . Heart attack Mother        MI in her late 68s  . Bipolar disorder Sister   . Cancer Sister        skin  . Dementia Brother   . Cancer Cousin        breast  . Hypertension Brother   . Cancer Sister        breast,brain,cervical ovarian  . Cancer Maternal Aunt        breast  . Anemia Neg Hx   . Arrhythmia Neg Hx   . Asthma Neg Hx   . Clotting disorder Neg Hx   . Fainting Neg Hx   . Heart disease Neg Hx   . Heart failure Neg Hx   . Hyperlipidemia Neg Hx     Social History   Socioeconomic History  . Marital status: Married    Spouse name: [name]  . Number of children: 0  . Years of education: Not on file  . Highest education level: Not on file  Occupational History  . Occupation: disability    Employer: UNEMPLOYED  Tobacco Use  . Smoking status: Never Smoker  . Smokeless tobacco: Never  Used  Substance and Sexual Activity  . Alcohol use: Yes    Alcohol/week: 0.0 standard drinks    Comment: Occasional wine  . Drug use: No  . Sexual activity: Yes    Partners: Male    Birth control/protection: Post-menopausal  Other Topics Concern  . Not on file  Social History Narrative   1 caffeinated drink per day. Does yoga for  60 minutes 3 days per week.   Social Determinants of Health   Financial Resource Strain: Not on file  Food Insecurity: Not on file  Transportation Needs: Not on file  Physical Activity: Not on file  Stress: Not on file  Social Connections: Not on file  Intimate Partner Violence: Not on file    Outpatient Medications Prior to Visit  Medication Sig Dispense Refill  . diazepam (VALIUM) 2 MG tablet Take 1 tablet (2 mg total) by mouth every 8 (eight) hours as needed for anxiety. 90 tablet 0  . EPINEPHrine 0.3 mg/0.3 mL IJ SOAJ injection USE AS DIRECTED AND AS NEEDED FOR SEVERE ALLERGIC REACTION 2 each prn  . L-Lysine 500 MG TABS Take 500 mg by mouth.    Marland Kitchen amoxicillin (AMOXIL) 500 MG capsule Take 1 capsule  (500 mg total) by mouth 3 (three) times daily. 15 capsule 0  . promethazine (PHENERGAN) 25 MG tablet Take 1 tablet (25 mg total) by mouth every 6 (six) hours as needed for nausea or vomiting. 12 tablet 0  . sulfamethoxazole-trimethoprim (BACTRIM DS) 800-160 MG tablet Take 1 tablet by mouth 2 (two) times daily for 10 days. 10 tablet 0   No facility-administered medications prior to visit.    Allergies  Allergen Reactions  . Demerol [Meperidine] Nausea And Vomiting, Nausea Only and Other (See Comments)    seizures  . Latex Rash  . Nickel     infections  . Ciprofloxacin     REACTION: sucidal  . Citrus     Geographic tongue  . Codeine     REACTION: Halluncination  . Erythromycin     REACTION: Vomiting  . Lactose Intolerance (Gi)     GI upset  . Lidocaine Other (See Comments)    Pt unsure if allergic. Last reaction at the dentist and had a seizure, but could have been injected into a vein  . Meperidine Hcl     REACTION: Muscle weakness, vomiting  . Peanut Oil     Other reaction(s): Other (See Comments) ORAL ULCERS  . Soy Allergy     Other reaction(s): Other (See Comments) MOUTH ULCER    Review of Systems     Objective:    Physical Exam  BP 124/84   Pulse 74   SpO2 99%  Wt Readings from Last 3 Encounters:  07/26/20 160 lb (72.6 kg)  07/24/20 170 lb (77.1 kg)  03/04/18 160 lb (72.6 kg)    There are no preventive care reminders to display for this patient.  There are no preventive care reminders to display for this patient.   Lab Results  Component Value Date   TSH 1.576 05/17/2015   Lab Results  Component Value Date   WBC 6.0 05/17/2015   HGB 14.0 05/17/2015   HCT 42.5 05/17/2015   MCV 90.4 05/17/2015   PLT 260 05/17/2015   Lab Results  Component Value Date   NA 138 05/17/2015   K 3.7 05/17/2015   CO2 24 05/17/2015   GLUCOSE 95 05/17/2015   BUN 11 05/17/2015   CREATININE 0.88 05/17/2015  BILITOT 0.5 05/17/2015   ALKPHOS 89 05/17/2015   AST 26  05/17/2015   ALT 26 05/17/2015   PROT 7.5 05/17/2015   ALBUMIN 4.5 05/17/2015   CALCIUM 9.5 05/17/2015   Lab Results  Component Value Date   CHOL 220 (H) 05/17/2015   Lab Results  Component Value Date   HDL 73 05/17/2015   Lab Results  Component Value Date   LDLCALC 132 (H) 05/17/2015   Lab Results  Component Value Date   TRIG 77 05/17/2015   Lab Results  Component Value Date   CHOLHDL 3.0 05/17/2015   No results found for: HGBA1C     Assessment & Plan:   Problem List Items Addressed This Visit   None   Visit Diagnoses    Fever, unspecified fever cause    -  Primary   Relevant Orders   Culture, blood (single) w Reflex to ID Panel   Culture, blood (single) w Reflex to ID Panel   Acute cystitis with hematuria       Relevant Orders   Urine Culture   Culture, blood (single) w Reflex to ID Panel   Culture, blood (single) w Reflex to ID Panel   History of UTI       Relevant Orders   POCT URINALYSIS DIP (CLINITEK) (Completed)   Urine Culture     Acute cystitis-she is feeling much better overall we did do a repeat urinalysis and will send for culture today.  I does discussed with her that the strep Adeleke is often a skin bacteria and typically we do not treated as a true UTI it sounds like she has had these in the past and really had urology has opted to treat them.  She did feel better after starting recent antibiotics so certainly she could have had some type of underlying infection that she could have also had viral gastroenteritis as well.  She does have a follow-up with urology on February 2.  She will be consulting with a new neurologist who is the partner of her previous urologist.  If the urine culture comes back positive then we will continue to treat additional days if needed.  She also asked about the possibility of whether or not the bacteria could actually be causing some pans or pandas.  She says ever since she was hospitalized with severe sepsis from bacteria  from the calculi she has had some weird and odd symptoms and some neurologic symptoms.  She wonders if this could be a possibility and wonders if her blood could be checked for the bacteria as well.  We did discuss the possibility of getting blood cultures if symptoms recur, or if urine is still positive.     No orders of the defined types were placed in this encounter.    Beatrice Lecher, MD

## 2020-10-20 DIAGNOSIS — F5000 Anorexia nervosa, unspecified: Secondary | ICD-10-CM | POA: Insufficient documentation

## 2020-10-20 DIAGNOSIS — F428 Other obsessive-compulsive disorder: Secondary | ICD-10-CM | POA: Diagnosis not present

## 2020-10-20 DIAGNOSIS — F4481 Dissociative identity disorder: Secondary | ICD-10-CM | POA: Diagnosis not present

## 2020-10-20 DIAGNOSIS — F419 Anxiety disorder, unspecified: Secondary | ICD-10-CM | POA: Diagnosis not present

## 2020-10-20 DIAGNOSIS — F32A Depression, unspecified: Secondary | ICD-10-CM | POA: Insufficient documentation

## 2020-10-20 DIAGNOSIS — F4312 Post-traumatic stress disorder, chronic: Secondary | ICD-10-CM | POA: Diagnosis not present

## 2020-10-20 LAB — URINE CULTURE
MICRO NUMBER:: 11430850
SPECIMEN QUALITY:: ADEQUATE

## 2020-10-25 DIAGNOSIS — M6281 Muscle weakness (generalized): Secondary | ICD-10-CM | POA: Diagnosis not present

## 2020-10-25 DIAGNOSIS — M62838 Other muscle spasm: Secondary | ICD-10-CM | POA: Diagnosis not present

## 2020-10-25 DIAGNOSIS — N9411 Superficial (introital) dyspareunia: Secondary | ICD-10-CM | POA: Diagnosis not present

## 2020-10-25 DIAGNOSIS — R102 Pelvic and perineal pain: Secondary | ICD-10-CM | POA: Diagnosis not present

## 2020-10-30 ENCOUNTER — Encounter: Payer: Self-pay | Admitting: Family Medicine

## 2020-11-01 DIAGNOSIS — F4312 Post-traumatic stress disorder, chronic: Secondary | ICD-10-CM | POA: Diagnosis not present

## 2020-11-01 DIAGNOSIS — F440 Dissociative amnesia: Secondary | ICD-10-CM | POA: Diagnosis not present

## 2020-11-03 DIAGNOSIS — R399 Unspecified symptoms and signs involving the genitourinary system: Secondary | ICD-10-CM | POA: Diagnosis not present

## 2020-11-03 DIAGNOSIS — N200 Calculus of kidney: Secondary | ICD-10-CM | POA: Diagnosis not present

## 2020-11-03 DIAGNOSIS — N132 Hydronephrosis with renal and ureteral calculous obstruction: Secondary | ICD-10-CM | POA: Diagnosis not present

## 2020-11-03 DIAGNOSIS — N1330 Unspecified hydronephrosis: Secondary | ICD-10-CM | POA: Diagnosis not present

## 2020-11-03 DIAGNOSIS — R7303 Prediabetes: Secondary | ICD-10-CM | POA: Diagnosis not present

## 2020-11-03 DIAGNOSIS — Z905 Acquired absence of kidney: Secondary | ICD-10-CM | POA: Diagnosis not present

## 2020-11-03 DIAGNOSIS — N281 Cyst of kidney, acquired: Secondary | ICD-10-CM | POA: Diagnosis not present

## 2020-11-09 DIAGNOSIS — R509 Fever, unspecified: Secondary | ICD-10-CM | POA: Diagnosis not present

## 2020-11-09 DIAGNOSIS — N3001 Acute cystitis with hematuria: Secondary | ICD-10-CM | POA: Diagnosis not present

## 2020-11-10 ENCOUNTER — Telehealth: Payer: Medicare Other | Admitting: Family Medicine

## 2020-11-15 ENCOUNTER — Other Ambulatory Visit (HOSPITAL_COMMUNITY)
Admission: RE | Admit: 2020-11-15 | Discharge: 2020-11-15 | Disposition: A | Discharge status: Home / Self Care | Payer: Medicare Other | Source: Ambulatory Visit | Attending: Obstetrics & Gynecology | Admitting: Obstetrics & Gynecology

## 2020-11-15 ENCOUNTER — Encounter: Payer: Self-pay | Admitting: Obstetrics & Gynecology

## 2020-11-15 ENCOUNTER — Ambulatory Visit (INDEPENDENT_AMBULATORY_CARE_PROVIDER_SITE_OTHER): Payer: Medicare Other | Admitting: Obstetrics & Gynecology

## 2020-11-15 ENCOUNTER — Other Ambulatory Visit: Payer: Self-pay

## 2020-11-15 VITALS — BP 142/74 | HR 70 | Resp 16 | Ht 64.0 in | Wt 165.0 lb

## 2020-11-15 DIAGNOSIS — Z01419 Encounter for gynecological examination (general) (routine) without abnormal findings: Secondary | ICD-10-CM

## 2020-11-15 DIAGNOSIS — Z01411 Encounter for gynecological examination (general) (routine) with abnormal findings: Secondary | ICD-10-CM

## 2020-11-15 DIAGNOSIS — Z124 Encounter for screening for malignant neoplasm of cervix: Secondary | ICD-10-CM

## 2020-11-15 DIAGNOSIS — N390 Urinary tract infection, site not specified: Secondary | ICD-10-CM

## 2020-11-15 LAB — CULTURE, BLOOD (SINGLE): MICRO NUMBER:: 11513689

## 2020-11-15 NOTE — Progress Notes (Signed)
Pt given info about [facility] and she will call insurance company to see if she qualifies

## 2020-11-16 NOTE — Progress Notes (Signed)
Subjective:     Autumn Garcia is a 60 y.o. female here for a routine exam.  Current complaints: recurrent UTIs and vulvodynia.  Pt is doing much better after pelvic PT.  She is using dilators.  She began having frequent UTIs and developed pyelo recently.  She has one kidney due to a staghorn calculus leading to a nephrectomy.  She saw a urologist who suggested topical estrogen to introitus and periurethral area.  Last year I prescribe intrarosa which she did not take.  After PT she feels that she is good vaginally and would only like to focus on the external genitalia.     Gynecologic History No LMP recorded. Patient is postmenopausal. Contraception: post menopausal status Last Pap: 2016. Results were: normal Last mammogram: 05/2020 needing diagnostic-->Birads 2.   Obstetric History OB History  Gravida Para Term Preterm AB Living  3 0     3 0  SAB IAB Ectopic Multiple Live Births  1 2          # Outcome Date GA Lbr Len/2nd Weight Sex Delivery Anes PTL Lv  3 SAB           2 IAB           1 IAB              The following portions of the patient's history were reviewed and updated as appropriate: allergies, current medications, past family history, past medical history, past social history, past surgical history and problem list.  Review of Systems Pertinent items noted in HPI and remainder of comprehensive ROS otherwise negative.    Objective:      Vitals:   11/15/20 1416  BP: (!) 142/74  Pulse: 70  Resp: 16  Weight: 165 lb (74.8 kg)  Height: 5\' 4"  (1.626 m)   Vitals:  WNL General appearance: alert, cooperative and no distress  HEENT: Normocephalic, without obvious abnormality, atraumatic Eyes: negative Throat: not evaluated (masked)  Respiratory: Clear to auscultation bilaterally  CV: Regular rate and rhythm  Breasts:  Normal appearance, no masses or tenderness, no nipple retraction or dimpling  GI: Soft, non-tender; bowel sounds normal; no masses,  no  organomegaly  GU: External Genitalia:  Tanner V, no lesion, tender on perineum and at introitus at level of Bartholin's glands (improved from last exam) Urethra:  No prolapse,    Vagina: Pale pink, normal rugae, no blood or discharge  Cervix: No CMT, no lesion  Uterus:  Normal size and contour, non tender  Adnexa: Normal, no masses, non tender  Musculoskeletal: No edema, redness or tenderness in the calves or thighs  Skin: No lesions or rash  Lymphatic: Axillary adenopathy: none     Psychiatric: Normal mood and behavior        Assessment:    Healthy female exam.    Plan:    1.  Pap with cotesting 2.  Yearly mammograms 3.  Start estrace externally nightly for 2 weeks then twice a week. 4.  If still having UTIs (pt voids b/f and after intercourse) can try Keflex prior to intercourse.  5.  F/U with urology as indicated.

## 2020-11-17 LAB — CYTOLOGY - PAP
Comment: NEGATIVE
Diagnosis: NEGATIVE
High risk HPV: NEGATIVE

## 2020-11-30 ENCOUNTER — Telehealth (INDEPENDENT_AMBULATORY_CARE_PROVIDER_SITE_OTHER): Payer: Medicare Other | Admitting: Family Medicine

## 2020-11-30 ENCOUNTER — Encounter: Payer: Self-pay | Admitting: Family Medicine

## 2020-11-30 DIAGNOSIS — R5383 Other fatigue: Secondary | ICD-10-CM

## 2020-11-30 DIAGNOSIS — N390 Urinary tract infection, site not specified: Secondary | ICD-10-CM | POA: Diagnosis not present

## 2020-11-30 DIAGNOSIS — R7301 Impaired fasting glucose: Secondary | ICD-10-CM

## 2020-11-30 DIAGNOSIS — R718 Other abnormality of red blood cells: Secondary | ICD-10-CM | POA: Diagnosis not present

## 2020-11-30 MED ORDER — SULFAMETHOXAZOLE-TRIMETHOPRIM 800-160 MG PO TABS: 1.0000 | ORAL_TABLET | Freq: Two times a day (BID) | ORAL | 0 refills | Status: DC

## 2020-11-30 NOTE — Progress Notes (Signed)
Virtual Visit via Video Note  I connected with Autumn Garcia on 11/30/20 at  1:00 PM EST by a video enabled telemedicine application and verified that I am speaking with the correct person using two identifiers.   I discussed the limitations of evaluation and management by telemedicine and the availability of in person appointments. The patient expressed understanding and agreed to proceed.  Patient location: at home  Provider location: in office  Subjective:    CC: F/U after urology HPI: She did see urology recently at [facility] on February 2.  Prior history of right nephrectomy in September 2010 for staghorn calculi and abscess in her right kidney.  She did have an ultrasound performed that day showing some mild hydronephrosis but it looks like it has been similar to what was previously seen in 2015.  He has been experiencing more frequent UTIs recently  Recurrent UTIs. She has started the estrace cream daily x 1 weeks. Wanted to know if group b strep in the vaginal area could be causing her symptoms. Would like to be tested if possible.    For her hypoglycemia she has made some dietary changes. She has been avoiding carbs and fried foods dn has been eating small 6 meals a day. She has been feeling better and has lost some weight.   Past medical history, Surgical history, Family history not pertinant except as noted below, Social history, Allergies, and medications have been entered into the medical record, reviewed, and corrections made.   Review of Systems: No fevers, chills, night sweats, weight loss, chest pain, or shortness of breath.   Objective:    General: Speaking clearly in complete sentences without any shortness of breath.  Alert and oriented x3.  Normal judgment. No apparent acute distress.    Impression and Recommendations:    Recurrent UTI He did see urology they recommended*trial of Estrace cream.  She also wants to discuss possibly doing a low-dose  antibiotic.  I really encouraged her to pick one of the other so we can really see what is working and effective and wants not.  She says for now she will stick with the Estrace cream I encouraged her to try to use it for at least 4 to 6 months that we can see for reducing her overall frequency of infection and try to keep a calendar.  I did send over prescription for Bactrim for acute UTI if symptoms occur over the weekend and she is unable to get care but otherwise stressed the importance of her coming in so that we can actually check a urine sample as well as a culture to verify that she really is having active infections and whether or not they are being adequately treated or not.  Not recommend group B strep testing at this point suspect that she may be colonized.  IFG (impaired fasting glucose) He also had some blood work done with the urologist office they did a hemoglobin A1c about a month ago and it was 6.0.  We discussed that this is in the prediabetes range and could also be contributing to some of her recent hypoglycemic episodes where she starts to feel shaky if she has not eaten.  The changes that she made including cutting out sugars and carbs and eating 6 small meals a day has been effective in controlling her symptoms.  Recommend repeat her A1c in 3 to 4 months to make sure that things are stabilizing and improving.   Low MCHC on CBC.  This can sometimes indicate anemia though her hemoglobin was actually normal which is great but will check for iron deficiency.Plan to check iron, ferritin, and A1C.     Time spent in encounter 25 minutes  I discussed the assessment and treatment plan with the patient. The patient was provided an opportunity to ask questions and all were answered. The patient agreed with the plan and demonstrated an understanding of the instructions.   The patient was advised to call back or seek an in-person evaluation if the symptoms worsen or if the condition fails to  improve as anticipated.   Beatrice Lecher, MD

## 2020-11-30 NOTE — Assessment & Plan Note (Signed)
He did see urology they recommended*trial of Estrace cream.  She also wants to discuss possibly doing a low-dose antibiotic.  I really encouraged her to pick one of the other so we can really see what is working and effective and wants not.  She says for now she will stick with the Estrace cream I encouraged her to try to use it for at least 4 to 6 months that we can see for reducing her overall frequency of infection and try to keep a calendar.  I did send over prescription for Bactrim for acute UTI if symptoms occur over the weekend and she is unable to get care but otherwise stressed the importance of her coming in so that we can actually check a urine sample as well as a culture to verify that she really is having active infections and whether or not they are being adequately treated or not.  Not recommend group B strep testing at this point suspect that she may be colonized.

## 2020-11-30 NOTE — Progress Notes (Signed)
LVM advising pt that I was calling to do her prescreening prior to the my chart visit

## 2020-11-30 NOTE — Assessment & Plan Note (Signed)
He also had some blood work done with the urologist office they did a hemoglobin A1c about a month ago and it was 6.0.  We discussed that this is in the prediabetes range and could also be contributing to some of her recent hypoglycemic episodes where she starts to feel shaky if she has not eaten.  The changes that she made including cutting out sugars and carbs and eating 6 small meals a day has been effective in controlling her symptoms.  Recommend repeat her A1c in 3 to 4 months to make sure that things are stabilizing and improving.

## 2020-12-03 DIAGNOSIS — R5383 Other fatigue: Secondary | ICD-10-CM | POA: Diagnosis not present

## 2020-12-03 DIAGNOSIS — D649 Anemia, unspecified: Secondary | ICD-10-CM | POA: Diagnosis not present

## 2020-12-04 LAB — IRON,TIBC AND FERRITIN PANEL
%SAT: 22 % (calc) (ref 16–45)
Ferritin: 108 ng/mL (ref 16–232)
Iron: 75 ug/dL (ref 45–160)
TIBC: 337 mcg/dL (calc) (ref 250–450)

## 2021-01-10 ENCOUNTER — Other Ambulatory Visit (INDEPENDENT_AMBULATORY_CARE_PROVIDER_SITE_OTHER): Payer: Medicare Other | Admitting: *Deleted

## 2021-01-10 ENCOUNTER — Telehealth: Payer: Self-pay | Admitting: *Deleted

## 2021-01-10 DIAGNOSIS — R399 Unspecified symptoms and signs involving the genitourinary system: Secondary | ICD-10-CM | POA: Diagnosis not present

## 2021-01-10 DIAGNOSIS — R7303 Prediabetes: Secondary | ICD-10-CM | POA: Diagnosis not present

## 2021-01-10 DIAGNOSIS — N390 Urinary tract infection, site not specified: Secondary | ICD-10-CM

## 2021-01-10 LAB — POCT URINALYSIS DIP (CLINITEK)
Bilirubin, UA: NEGATIVE
Blood, UA: NEGATIVE
Glucose, UA: NEGATIVE mg/dL
Ketones, POC UA: NEGATIVE mg/dL
Leukocytes, UA: NEGATIVE
Nitrite, UA: NEGATIVE
POC PROTEIN,UA: NEGATIVE
Spec Grav, UA: 1.01 (ref 1.010–1.025)
Urobilinogen, UA: 0.2 E.U./dL
pH, UA: 6 (ref 5.0–8.0)

## 2021-01-10 NOTE — Telephone Encounter (Signed)
Pt stated that she was told by Dr. Madilyn Fireman that if she began to experience UTI sxs to come by to have her urine tested and sent for ucx.   Pt denies any abdominal,back or flank pain. No fevers,chills,nausea,vomiting or diarrhea.

## 2021-01-10 NOTE — Telephone Encounter (Signed)
Urine was completely normal.

## 2021-01-11 ENCOUNTER — Encounter: Payer: Self-pay | Admitting: Physician Assistant

## 2021-01-11 ENCOUNTER — Other Ambulatory Visit: Payer: Self-pay

## 2021-01-11 ENCOUNTER — Ambulatory Visit (INDEPENDENT_AMBULATORY_CARE_PROVIDER_SITE_OTHER): Payer: Medicare Other | Admitting: Physician Assistant

## 2021-01-11 DIAGNOSIS — I951 Orthostatic hypotension: Secondary | ICD-10-CM

## 2021-01-11 DIAGNOSIS — R42 Dizziness and giddiness: Secondary | ICD-10-CM | POA: Diagnosis not present

## 2021-01-11 DIAGNOSIS — H532 Diplopia: Secondary | ICD-10-CM

## 2021-01-11 DIAGNOSIS — R030 Elevated blood-pressure reading, without diagnosis of hypertension: Secondary | ICD-10-CM | POA: Diagnosis not present

## 2021-01-11 LAB — URINE CULTURE
MICRO NUMBER:: 11756723
Result:: NO GROWTH
SPECIMEN QUALITY:: ADEQUATE

## 2021-01-11 MED ORDER — FLUTICASONE PROPIONATE 50 MCG/ACT NA SUSP: 2.0000 | Freq: Every day | NASAL | 0 refills | Status: DC

## 2021-01-11 NOTE — Progress Notes (Signed)
Subjective:    Patient ID: Autumn Garcia, female    DOB: 1960/12/28, 60 y.o.   MRN: [id number]  HPI  Pt is a 60 yo female with history of vertigo who presents to the clinic with dizziness.   She has ultimately had some vertigo for the last year off on and on. She notices more when she goes to lay down at night and feels like the room is spinning. She often feels like she has some double vision. For the last 4 days she has noticed more dizziness when standing that last for seconds and then resolves. No passing out. She had to have vestibular rehab at one point.   Pt denies any fever, chills, sinus pressure, CP, palpitations, headaches, cough, SOB.   Marland Kitchen. Active Ambulatory Problems    Diagnosis Date Noted  . ANXIETY DISORDER, GENERALIZED 06/14/2009  . HOT FLASHES 09/03/2009  . KNEE PAIN 01/26/2009  . Other convulsions 01/26/2009  . Dissociative identity disorder ([place]) 10/06/2011  . PTSD (post-traumatic stress disorder) 11/15/2011  . Dissociative disorder 06/03/2012  . Ventral hernia without obstruction or gangrene 01/27/2013  . Vitamin D deficiency 10/29/2013  . Basal cell carcinoma of skin 10/29/2013  . Vertigo 11/13/2013  . Herpes simplex type II infection 04/26/2014  . Hyperlipidemia 04/26/2015  . Bee allergy status 09/27/2018  . Borderline personality disorder ([place]) 10/03/2011  . Recurrent UTI 11/30/2020  . IFG (impaired fasting glucose) 11/30/2020  . Orthostatic hypotension 01/12/2021  . Elevated blood pressure reading 01/12/2021   Resolved Ambulatory Problems    Diagnosis Date Noted  . CANDIDIASIS, ORAL 10/15/2009  . Acute pharyngitis 05/15/2010  . URI 10/15/2009  . Acute cystitis 01/26/2010  . CELLULITIS, RIGHT LEG 03/18/2010  . SKIN RASH 01/26/2009  . Skin cancer of forehead 11/15/2011  . Chest pain 09/08/2015  . Dyspnea 09/08/2015  . Dysuria 07/26/2020   Past Medical History:  Diagnosis Date  . Anxiety   . Chronic kidney disease   . Depression   .  Eating disorder   . Epilepsy ([place])   . HSV infection   . Kidney stones   . Murmur   . TB (tuberculosis)      Review of Systems See HPI.     Objective:   Physical Exam Vitals reviewed.  Constitutional:      Appearance: Normal appearance.  HENT:     Head: Normocephalic.     Right Ear: Tympanic membrane normal.     Left Ear: Tympanic membrane normal.     Nose: Nose normal.     Mouth/Throat:     Mouth: Mucous membranes are moist.  Eyes:     Extraocular Movements: Extraocular movements intact.     Conjunctiva/sclera: Conjunctivae normal.     Pupils: Pupils are equal, round, and reactive to light.  Neck:     Vascular: No carotid bruit.  Cardiovascular:     Rate and Rhythm: Normal rate and regular rhythm.     Pulses: Normal pulses.  Pulmonary:     Effort: Pulmonary effort is normal.     Breath sounds: Normal breath sounds.  Musculoskeletal:     Right lower leg: No edema.     Left lower leg: No edema.  Neurological:     General: No focal deficit present.     Mental Status: She is alert and oriented to person, place, and time.     Comments: No nystagmus with DixHallpike.  Some dizziness with moving from laying to sitting position.   Psychiatric:  Mood and Affect: Mood normal.           Assessment & Plan:  Marland KitchenMarland KitchenPandalene was seen today for dizziness.  Diagnoses and all orders for this visit:  Orthostatic hypotension  Vertigo -     fluticasone (FLONASE) 50 MCG/ACT nasal spray; Place 2 sprays into both nostrils daily.  Elevated blood pressure reading  Double vision -     Ambulatory referral to Ophthalmology   Pt does have some evidence of orthostatic hypotension due to systolic drop of right at 20points when going from a sitting position to standing position.  She also reports room spinning when she is laying down at night.  Negative dix hallpike today in office.  I think 2 separate things are going on.  Start flonase daily.  Start epley manuevers.   Keep checking BP and keep log.  Stay hydrated.  Start wearing compression stockings.  HO given today. Get eyes checked. Referral made.   Follow up with [facility] in 2 weeks.

## 2021-01-11 NOTE — Telephone Encounter (Signed)
Pt advised of results.  She stated that she gets dizzy upon waking and throughout the day.

## 2021-01-11 NOTE — Telephone Encounter (Signed)
Please call pt and schedule her for same day,or acute for dizziness/bp issues.

## 2021-01-11 NOTE — Patient Instructions (Addendum)
Get eye exam. Start flonase.  Wear compression stockings. Make sure hydrated.    Orthostatic Hypotension Blood pressure is a measurement of how strongly, or weakly, your blood is pressing against the walls of your arteries. Orthostatic hypotension is a sudden drop in blood pressure that happens when you quickly change positions, such as when you get up from sitting or lying down. Arteries are blood vessels that carry blood from your heart throughout your body. When blood pressure is too low, you may not get enough blood to your brain or to the rest of your organs. This can cause weakness, light-headedness, rapid heartbeat, and fainting. This can last for just a few seconds or for up to a few minutes. Orthostatic hypotension is usually not a serious problem. However, if it happens frequently or gets worse, it may be a sign of something more serious. What are the causes? This condition may be caused by:  Sudden changes in posture, such as standing up quickly after you have been sitting or lying down.  Blood loss.  Loss of body fluids (dehydration).  Heart problems.  Hormone (endocrine) problems.  Pregnancy.  Severe infection.  Lack of certain nutrients.  Severe allergic reactions (anaphylaxis).  Certain medicines, such as blood pressure medicine or medicines that make the body lose excess fluids (diuretics). Sometimes, this condition can be caused by not taking medicine as directed, such as taking too much of a certain medicine. What increases the risk? The following factors may make you more likely to develop this condition:  Age. Risk increases as you get older.  Conditions that affect the heart or the central nervous system.  Taking certain medicines, such as blood pressure medicine or diuretics.  Being pregnant. What are the signs or symptoms? Symptoms of this condition may include:  Weakness.  Light-headedness.  Dizziness.  Blurred vision.  Fatigue.  Rapid  heartbeat.  Fainting, in severe cases. How is this diagnosed? This condition is diagnosed based on:  Your medical history.  Your symptoms.  Your blood pressure measurement. Your health care provider will check your blood pressure when you are: ? Lying down. ? Sitting. ? Standing. A blood pressure reading is recorded as two numbers, such as "120 over 80" (or 120/80). The first ("top") number is called the systolic pressure. It is a measure of the pressure in your arteries as your heart beats. The second ("bottom") number is called the diastolic pressure. It is a measure of the pressure in your arteries when your heart relaxes between beats. Blood pressure is measured in a unit called mm Hg. Healthy blood pressure for most adults is 120/80. If your blood pressure is below 90/60, you may be diagnosed with hypotension. Other information or tests that may be used to diagnose orthostatic hypotension include:  Your other vital signs, such as your heart rate and temperature.  Blood tests.  Tilt table test. For this test, you will be safely secured to a table that moves you from a lying position to an upright position. Your heart rhythm and blood pressure will be monitored during the test. How is this treated? This condition may be treated by:  Changing your diet. This may involve eating more salt (sodium) or drinking more water.  Taking medicines to raise your blood pressure.  Changing the dosage of certain medicines you are taking that might be lowering your blood pressure.  Wearing compression stockings. These stockings help to prevent blood clots and reduce swelling in your legs. In some cases, you  may need to go to the hospital for:  Fluid replacement. This means you will receive fluids through an IV.  Blood replacement. This means you will receive donated blood through an IV (transfusion).  Treating an infection or heart problems, if this applies.  Monitoring. You may need to be  monitored while medicines that you are taking wear off. Follow these instructions at home: Eating and drinking  Drink enough fluid to keep your urine pale yellow.  Eat a healthy diet, and follow instructions from your health care provider about eating or drinking restrictions. A healthy diet includes: ? Fresh fruits and vegetables. ? Whole grains. ? Lean meats. ? Low-fat dairy products.  Eat extra salt only as directed. Do not add extra salt to your diet unless your health care provider told you to do that.  Eat frequent, small meals.  Avoid standing up suddenly after eating.   Medicines  Take over-the-counter and prescription medicines only as told by your health care provider. ? Follow instructions from your health care provider about changing the dosage of your current medicines, if this applies. ? Do not stop or adjust any of your medicines on your own. General instructions  Wear compression stockings as told by your health care provider.  Get up slowly from lying down or sitting positions. This gives your blood pressure a chance to adjust.  Avoid hot showers and excessive heat as directed by your health care provider.  Return to your normal activities as told by your health care provider. Ask your health care provider what activities are safe for you.  Do not use any products that contain nicotine or tobacco, such as cigarettes, e-cigarettes, and chewing tobacco. If you need help quitting, ask your health care provider.  Keep all follow-up visits as told by your health care provider. This is important.   Contact a health care provider if you:  Vomit.  Have diarrhea.  Have a fever for more than 2-3 days.  Feel more thirsty than usual.  Feel weak and tired. Get help right away if you:  Have chest pain.  Have a fast or irregular heartbeat.  Develop numbness in any part of your body.  Cannot move your arms or your legs.  Have trouble speaking.  Become sweaty  or feel light-headed.  Faint.  Feel short of breath.  Have trouble staying awake.  Feel confused. Summary  Orthostatic hypotension is a sudden drop in blood pressure that happens when you quickly change positions.  Orthostatic hypotension is usually not a serious problem.  It is diagnosed by having your blood pressure taken lying down, sitting, and then standing.  It may be treated by changing your diet or adjusting your medicines. This information is not intended to replace advice given to you by your health care provider. Make sure you discuss any questions you have with your health care provider. Document Revised: 03/14/2018 Document Reviewed: 03/14/2018 Elsevier Patient Education  2021 [place]. Benign Positional Vertigo Vertigo is the feeling that you or your surroundings are moving when they are not. Benign positional vertigo is the most common form of vertigo. This is usually a harmless condition (benign). This condition is positional. This means that symptoms are triggered by certain movements and positions. This condition can be dangerous if it occurs while you are doing something that could cause harm to you or others. This includes activities such as driving or operating machinery. What are the causes? The inner ear has fluid-filled canals that help your  brain sense movement and balance. When the fluid moves, the brain receives messages about your body's position. With benign positional vertigo, crystals in the inner ear break free and disturb the inner ear area. This causes your brain to receive confusing messages about your body's position. What increases the risk? You are more likely to develop this condition if:  You are a woman.  You are 64 years of age or older.  You have recently had a head injury.  You have an inner ear disease. What are the signs or symptoms? Symptoms of this condition usually happen when you move your head or your eyes in different  directions. Symptoms may start suddenly, and usually last for less than a minute. They include:  Loss of balance and falling.  Feeling like you are spinning or moving.  Feeling like your surroundings are spinning or moving.  Nausea and vomiting.  Blurred vision.  Dizziness.  Involuntary eye movement (nystagmus). Symptoms can be mild and cause only minor problems, or they can be severe and interfere with daily life. Episodes of benign positional vertigo may return (recur) over time. Symptoms may improve over time. How is this diagnosed? This condition may be diagnosed based on:  Your medical history.  Physical exam of the head, neck, and ears.  Positional tests to check for or stimulate vertigo. You may be asked to turn your head and change positions, such as going from sitting to lying down. A health care provider will watch for symptoms of vertigo. You may be referred to a health care provider who specializes in ear, nose, and throat problems (ENT, or otolaryngologist) or a provider who specializes in disorders of the nervous system (neurologist). How is this treated? This condition may be treated in a session in which your health care provider moves your head in specific positions to help the displaced crystals in your inner ear move. Treatment for this condition may take several sessions. Surgery may be needed in severe cases, but this is rare. In some cases, benign positional vertigo may resolve on its own in 2-4 weeks.   Follow these instructions at home: Safety  Move slowly. Avoid sudden body or head movements or certain positions, as told by your health care provider.  Avoid driving until your health care provider says it is safe for you to do so.  Avoid operating heavy machinery until your health care provider says it is safe for you to do so.  Avoid doing any tasks that would be dangerous to you or others if vertigo occurs.  If you have trouble walking or keeping your  balance, try using a cane for stability. If you feel dizzy or unstable, sit down right away.  Return to your normal activities as told by your health care provider. Ask your health care provider what activities are safe for you. General instructions  Take over-the-counter and prescription medicines only as told by your health care provider.  Drink enough fluid to keep your urine pale yellow.  Keep all follow-up visits as told by your health care provider. This is important. Contact a health care provider if:  You have a fever.  Your condition gets worse or you develop new symptoms.  Your family or friends notice any behavioral changes.  You have nausea or vomiting that gets worse.  You have numbness or a prickling and tingling sensation. Get help right away if you:  Have difficulty speaking or moving.  Are always dizzy.  Faint.  Develop severe headaches.  Have weakness in your legs or arms.  Have changes in your hearing or vision.  Develop a stiff neck.  Develop sensitivity to light. Summary  Vertigo is the feeling that you or your surroundings are moving when they are not. Benign positional vertigo is the most common form of vertigo.  This condition is caused by crystals in the inner ear that become displaced. This causes a disturbance in an area of the inner ear that helps your brain sense movement and balance.  Symptoms include loss of balance and falling, feeling that you or your surroundings are moving, nausea and vomiting, and blurred vision.  This condition can be diagnosed based on symptoms, a physical exam, and positional tests.  Follow safety instructions as told by your health care provider. You will also be told when to contact your health care provider in case of problems. This information is not intended to replace advice given to you by your health care provider. Make sure you discuss any questions you have with your health care provider. Document  Revised: 08/12/2019 Document Reviewed: 02/27/2018 Elsevier Patient Education  2021 [employer].

## 2021-01-11 NOTE — Telephone Encounter (Signed)
Appointment has been made. No further questions at this time.  

## 2021-01-12 ENCOUNTER — Encounter: Payer: Self-pay | Admitting: Physician Assistant

## 2021-01-12 DIAGNOSIS — R030 Elevated blood-pressure reading, without diagnosis of hypertension: Secondary | ICD-10-CM | POA: Insufficient documentation

## 2021-01-12 DIAGNOSIS — I951 Orthostatic hypotension: Secondary | ICD-10-CM | POA: Insufficient documentation

## 2021-01-19 ENCOUNTER — Encounter: Payer: Medicare Other | Admitting: Sports Medicine

## 2021-02-01 ENCOUNTER — Ambulatory Visit: Payer: Medicare Other | Admitting: Family Medicine

## 2021-02-03 ENCOUNTER — Other Ambulatory Visit: Payer: Self-pay | Admitting: Physician Assistant

## 2021-02-03 DIAGNOSIS — R42 Dizziness and giddiness: Secondary | ICD-10-CM

## 2021-02-14 ENCOUNTER — Ambulatory Visit (INDEPENDENT_AMBULATORY_CARE_PROVIDER_SITE_OTHER): Payer: Medicare Other | Admitting: Family Medicine

## 2021-02-14 DIAGNOSIS — Z1211 Encounter for screening for malignant neoplasm of colon: Secondary | ICD-10-CM

## 2021-02-14 DIAGNOSIS — Z0000 Encounter for general adult medical examination without abnormal findings: Secondary | ICD-10-CM

## 2021-02-14 DIAGNOSIS — Z1231 Encounter for screening mammogram for malignant neoplasm of breast: Secondary | ICD-10-CM

## 2021-02-14 DIAGNOSIS — Z1212 Encounter for screening for malignant neoplasm of rectum: Secondary | ICD-10-CM

## 2021-02-14 NOTE — Progress Notes (Signed)
MEDICARE ANNUAL WELLNESS VISIT  02/14/2021  Telephone Visit Disclaimer This Medicare AWV was conducted by telephone due to national recommendations for restrictions regarding the COVID-19 Pandemic (e.g. social distancing).  I verified, using two identifiers, that I am speaking with Autumn Garcia or their authorized healthcare agent. I discussed the limitations, risks, security, and privacy concerns of performing an evaluation and management service by telephone and the potential availability of an in-person appointment in the future. The patient expressed understanding and agreed to proceed.  Location of Patient: Home Location of Provider (nurse):  In the office.  Subjective:    Autumn Garcia is a 60 y.o. female patient of Metheney, Rene Kocher, MD who had a Medicare Annual Wellness Visit today via telephone. Autumn Garcia is Disabled and lives with their spouse. she has 0 children. she reports that she is socially active and does interact with friends/family regularly. she is moderately physically active and enjoys gardening, painting and cooking.  Patient Care Team: Hali Marry, MD as PCP - General (Family Medicine)  Advanced Directives 02/14/2021 05/12/2020 04/26/2015  Does Patient Have a Medical Advance Directive? No No No  Would patient like information on creating a medical advance directive? No - Patient declined No - Patient declined No - patient declined information    Hospital Utilization Over the Past 12 Months: # of hospitalizations or ER visits: 0 # of surgeries: 0  Review of Systems    Patient reports that her overall health is worse compared to last year.  History obtained from chart review and the patient  Patient Reported Readings (BP, Pulse, CBG, Weight, etc) none  Pain Assessment Pain : No/denies pain Pain Score: 0-No pain     Current Medications & Allergies (verified) Allergies as of 02/14/2021      Reactions   Demerol  [meperidine] Nausea And Vomiting, Nausea Only, Other (See Comments)   seizures   Latex Rash   Nickel    infections   Ciprofloxacin    REACTION: sucidal   Citrus    Geographic tongue   Codeine    REACTION: Halluncination   Erythromycin    REACTION: Vomiting   Lidocaine Other (See Comments)   Pt unsure if allergic. Last reaction at the dentist and had a seizure, but could have been injected into a vein   Meperidine Hcl    REACTION: Muscle weakness, vomiting   Peanut Oil    Other reaction(s): Other (See Comments) ORAL ULCERS   Peanut-containing Drug Products Nausea Only   Soy Allergy    Other reaction(s): Other (See Comments) MOUTH ULCER      Medication List       Accurate as of Feb 14, 2021  3:02 PM. If you have any questions, ask your nurse or doctor.        diazepam 2 MG tablet Commonly known as: VALIUM Take 1 tablet (2 mg total) by mouth every 8 (eight) hours as needed for anxiety.   EPINEPHrine 0.3 mg/0.3 mL Soaj injection Commonly known as: EPI-PEN USE AS DIRECTED AND AS NEEDED FOR SEVERE ALLERGIC REACTION   estradiol 0.1 MG/GM vaginal cream Commonly known as: ESTRACE Place vaginally.   fluticasone 50 MCG/ACT nasal spray Commonly known as: FLONASE SPRAY 2 SPRAYS INTO EACH NOSTRIL EVERY DAY   L-Lysine 500 MG Tabs Take 500 mg by mouth. 2 times a week       History (reviewed): Past Medical History:  Diagnosis Date  . Anxiety   . Chronic kidney disease   .  Depression   . Eating disorder   . Epilepsy ([place])   . HSV infection   . Kidney stones   . Murmur   . PTSD (post-traumatic stress disorder)   . Skin cancer of forehead    If could be a more serious cancer and will not know until after surgery  . TB (tuberculosis)    Tested positive and treated   Past Surgical History:  Procedure Laterality Date  . CERVICAL POLYPECTOMY    . CHOLECYSTECTOMY    . KIDNEY SURGERY    . LAPAROSCOPIC NEPHRECTOMY  2010.     Dr. Angeline Slim  at [facility] for large stone  .  left knee surgery    . Removal of basil cell carcinoma     Family History  Problem Relation Age of Onset  . Bipolar disorder Mother   . Heart attack Mother        MI in her late 37s  . Skin cancer Mother   . Bipolar disorder Sister   . Dementia Brother   . Cancer Cousin        breast  . Hypertension Brother   . Cancer Sister        breast,brain,cervical ovarian  . Cancer Maternal Aunt        breast  . Breast cancer Maternal Grandmother   . Anemia Neg Hx   . Arrhythmia Neg Hx   . Asthma Neg Hx   . Clotting disorder Neg Hx   . Fainting Neg Hx   . Heart disease Neg Hx   . Heart failure Neg Hx   . Hyperlipidemia Neg Hx    Social History   Socioeconomic History  . Marital status: Married    Spouse name: [name]  . Number of children: 0  . Years of education: 70  . Highest education level: Some college, no degree  Occupational History  . Occupation: disability    Employer: UNEMPLOYED  Tobacco Use  . Smoking status: Never Smoker  . Smokeless tobacco: Never Used  Vaping Use  . Vaping Use: Never used  Substance and Sexual Activity  . Alcohol use: Not Currently    Alcohol/week: 0.0 standard drinks    Comment: Occasional wine  . Drug use: No  . Sexual activity: Yes    Partners: Male    Birth control/protection: Post-menopausal  Other Topics Concern  . Not on file  Social History Narrative   Lives with her husband. Enjoys gardening and painting.    Social Determinants of Health   Financial Resource Strain: Low Risk   . Difficulty of Paying Living Expenses: Not hard at all  Food Insecurity: No Food Insecurity  . Worried About Charity fundraiser in the Last Year: Never true  . Ran Out of Food in the Last Year: Never true  Transportation Needs: No Transportation Needs  . Lack of Transportation (Medical): No  . Lack of Transportation (Non-Medical): No  Physical Activity: Sufficiently Active  . Days of Exercise per Week: 7 days  . Minutes of Exercise per Session:  30 min  Stress: No Stress Concern Present  . Feeling of Stress : Not at all  Social Connections: Socially Isolated  . Frequency of Communication with Friends and Family: Never  . Frequency of Social Gatherings with Friends and Family: Never  . Attends Religious Services: Never  . Active Member of Clubs or Organizations: No  . Attends Archivist Meetings: Never  . Marital Status: Married    Activities of Daily  Living In your present state of health, do you have any difficulty performing the following activities: 02/14/2021  Hearing? N  Vision? Y  Comment hasn't had an eye exam in 4-5 years.  Difficulty concentrating or making decisions? Y  Comment has trouble concentrating.  Walking or climbing stairs? N  Dressing or bathing? N  Doing errands, shopping? Y  Comment patient doesn't drive; her husband is her support system.  Preparing Food and eating ? N  Using the Toilet? N  In the past six months, have you accidently leaked urine? N  Do you have problems with loss of bowel control? N  Managing your Medications? N  Managing your Finances? N  Housekeeping or managing your Housekeeping? N  Some recent data might be hidden    Patient Education/ Literacy How often do you need to have someone help you when you read instructions, pamphlets, or other written materials from your doctor or pharmacy?: 1 - Never What is the last grade level you completed in school?: 12th grade  Exercise Current Exercise Habits: Home exercise routine, Type of exercise: Other - see comments (dancing), Time (Minutes): 30, Frequency (Times/Week): >7, Weekly Exercise (Minutes/Week): 0, Intensity: Moderate, Exercise limited by: None identified  Diet Patient reports consuming 6 small  meals a day and 0 snack(s) a day Patient reports that her primary diet is: Regular Patient reports that she does have regular access to food.   Depression Screen PHQ 2/9 Scores 02/14/2021 09/27/2018  PHQ - 2 Score 6 3   PHQ- 9 Score 15 13  Some encounter information is confidential and restricted. Go to Review Flowsheets activity to see all data.    Patient has an appointment with Dr. Madilyn Fireman to discuss her depression screening.  Fall Risk Fall Risk  02/14/2021  Falls in the past year? 1  Number falls in past yr: 1  Injury with Fall? 0  Risk for fall due to : Other (Comment)  Risk for fall due to: Comment patient has numerous falls due to her seizures.  Follow up Education provided;Falls evaluation completed;Falls prevention discussed     Objective:  Autumn Garcia seemed alert and oriented and she participated appropriately during our telephone visit.  Blood Pressure Weight BMI  BP Readings from Last 3 Encounters:  11/15/20 (!) 142/74  10/19/20 124/84  10/12/20 (!) 166/83   Wt Readings from Last 3 Encounters:  11/15/20 165 lb (74.8 kg)  07/26/20 160 lb (72.6 kg)  07/24/20 170 lb (77.1 kg)   BMI Readings from Last 1 Encounters:  11/15/20 28.32 kg/m    *Unable to obtain current vital signs, weight, and BMI due to telephone visit type  Hearing/Vision  . Autumn Garcia did not seem to have difficulty with hearing/understanding during the telephone conversation . Reports that she has not had a formal eye exam by an eye care professional within the past year . Reports that she has not had a formal hearing evaluation within the past year *Unable to fully assess hearing and vision during telephone visit type  Cognitive Function: 6CIT Screen 02/14/2021  What Year? 0 points  What month? 0 points  What time? 0 points  Count back from 20 0 points  Months in reverse 0 points  Repeat phrase 0 points  Total Score 0   (Normal:0-7, Significant for Dysfunction: >8)  Normal Cognitive Function Screening: Yes   Immunization & Health Maintenance Record Immunization History  Administered Date(s) Administered  . PFIZER(Purple Top)SARS-COV-2 Vaccination 01/16/2020, 01/29/2020  . Tdap  10/02/2008  Health Maintenance  Topic Date Due  . Fecal DNA (Cologuard)  03/03/2021 (Originally 07/09/2010)  . COVID-19 Vaccine (3 - Inadvertent risk 4-dose series) 03/18/2021 (Originally 02/26/2020)  . TETANUS/TDAP  08/25/2021 (Originally 10/02/2018)  . INFLUENZA VACCINE  05/02/2021  . MAMMOGRAM  04/21/2022  . PAP SMEAR-Modifier  11/16/2023  . Hepatitis C Screening  Completed  . HIV Screening  Completed  . HPV VACCINES  Aged Out       Assessment  This is a routine wellness examination for Autumn Garcia.  Health Maintenance: Due or Overdue There are no preventive care reminders to display for this patient.  Autumn Garcia does not need a referral for Community Assistance: Care Management:   no Social Work:    no Prescription Assistance:  no Nutrition/Diabetes Education:  no   Plan:  Personalized Goals Goals Addressed              This Visit's Progress   .  Patient Stated (pt-stated)        02/14/2021 AWV Goal: Diabetes Management  . Patient will maintain an A1C level below 8.0 . Patient will not develop any diabetic foot complications . Patient will not experience any hypoglycemic episodes over the next 3 months . Patient will notify our office of any CBG readings outside of the provider recommended range by calling [phone] . Patient will adhere to provider recommendations for diabetes management  Patient Self Management Activities . take all medications as prescribed and report any negative side effects . monitor and record blood sugar readings as directed . adhere to a low carbohydrate diet that incorporates lean proteins, vegetables, whole grains, low glycemic fruits . check feet daily noting any sores, cracks, injuries, or callous formations . see PCP or podiatrist if she notices any changes in her legs, feet, or toenails . Patient will visit PCP and have an A1C level checked every 3 to 6 months as directed  . have a yearly eye exam to  monitor for vascular changes associated with diabetes and will request that the report be sent to her pcp.  . consult with her PCP regarding any changes in her health or new or worsening symptoms       Personalized Health Maintenance & Screening Recommendations  Td vaccine Screening mammography Colorectal cancer screening Eye exam.  Lung Cancer Screening Recommended: no (Low Dose CT Chest recommended if Age 62-80 years, 30 pack-year currently smoking OR have quit w/in past 15 years) Hepatitis C Screening recommended: no HIV Screening recommended: no  Advanced Directives: Written information was not prepared per patient's request.  Referrals & Orders No orders of the defined types were placed in this encounter.   Follow-up Plan . Follow-up with Hali Marry, MD as planned, to discuss the depression screening score. . Schedule your eye exam.  . Please bring the date Tetanus shot.  . Cologuard and mammogram referral has been sent. . Medicare wellness visit in one year.   I have personally reviewed and noted the following in the patient's chart:   . Medical and social history . Use of alcohol, tobacco or illicit drugs  . Current medications and supplements . Functional ability and status . Nutritional status . Physical activity . Advanced directives . List of other physicians . Hospitalizations, surgeries, and ER visits in previous 12 months . Vitals . Screenings to include cognitive, depression, and falls . Referrals and appointments  In addition, I have reviewed and discussed with Autumn Garcia certain preventive protocols, quality  metrics, and best practice recommendations. A written personalized care plan for preventive services as well as general preventive health recommendations is available and can be mailed to the patient at her request.      Tinnie Gens , RN  02/14/2021

## 2021-02-14 NOTE — Patient Instructions (Addendum)
[place] Maintenance Summary and Written Plan of Care  Autumn Garcia ,  Thank you for allowing me to perform your Medicare Annual Wellness Visit and for your ongoing commitment to your health.   Health Maintenance & Immunization History Health Maintenance  Topic Date Due  . Fecal DNA (Cologuard)  03/03/2021 (Originally 07/09/2010)  . COVID-19 Vaccine (3 - Inadvertent risk 4-dose series) 03/18/2021 (Originally 02/26/2020)  . TETANUS/TDAP  08/25/2021 (Originally 10/02/2018)  . INFLUENZA VACCINE  05/02/2021  . MAMMOGRAM  04/21/2022  . PAP SMEAR-Modifier  11/16/2023  . Hepatitis C Screening  Completed  . HIV Screening  Completed  . HPV VACCINES  Aged Out   Immunization History  Administered Date(s) Administered  . PFIZER(Purple Top)SARS-COV-2 Vaccination 01/16/2020, 01/29/2020  . Tdap 10/02/2008    These are the patient goals that we discussed: Goals Addressed              This Visit's Progress   .  Patient Stated (pt-stated)        02/14/2021 AWV Goal: Diabetes Management  . Patient will maintain an A1C level below 8.0 . Patient will not develop any diabetic foot complications . Patient will not experience any hypoglycemic episodes over the next 3 months . Patient will notify our office of any CBG readings outside of the provider recommended range by calling [phone] . Patient will adhere to provider recommendations for diabetes management  Patient Self Management Activities . take all medications as prescribed and report any negative side effects . monitor and record blood sugar readings as directed . adhere to a low carbohydrate diet that incorporates lean proteins, vegetables, whole grains, low glycemic fruits . check feet daily noting any sores, cracks, injuries, or callous formations . see PCP or podiatrist if she notices any changes in her legs, feet, or toenails . Patient will visit PCP and have an A1C level checked every 3 to 6  months as directed  . have a yearly eye exam to monitor for vascular changes associated with diabetes and will request that the report be sent to her pcp.  . consult with her PCP regarding any changes in her health or new or worsening symptoms         This is a list of Health Maintenance Items that are overdue or due now: Td vaccine Screening mammography Colorectal cancer screening Eye exam.  Orders/Referrals Placed Today: No orders of the defined types were placed in this encounter.  (Contact our referral department at [phone] if you have not spoken with someone about your referral appointment within the next 5 days)    Follow-up Plan . Follow-up with Hali Marry, MD as planned, to discuss the depression screening score. . Schedule your eye exam.  . Please bring the date Tetanus shot.  . Cologuard and mammogram referral has been sent. . Medicare wellness visit in one year.       Diabetes Mellitus and Foot Care Foot care is an important part of your health, especially when you have diabetes. Diabetes may cause you to have problems because of poor blood flow (circulation) to your feet and legs, which can cause your skin to:  Become thinner and drier.  Break more easily.  Heal more slowly.  Peel and crack. You may also have nerve damage (neuropathy) in your legs and feet, causing decreased feeling in them. This means that you may not notice minor injuries to your feet that could lead to more serious problems. Noticing and addressing  any potential problems early is the best way to prevent future foot problems. How to care for your feet Foot hygiene  Wash your feet daily with warm water and mild soap. Do not use hot water. Then, pat your feet and the areas between your toes until they are completely dry. Do not soak your feet as this can dry your skin.  Trim your toenails straight across. Do not dig under them or around the cuticle. File the edges of your  nails with an emery board or nail file.  Apply a moisturizing lotion or petroleum jelly to the skin on your feet and to dry, brittle toenails. Use lotion that does not contain alcohol and is unscented. Do not apply lotion between your toes.   Shoes and socks  Wear clean socks or stockings every day. Make sure they are not too tight. Do not wear knee-high stockings since they may decrease blood flow to your legs.  Wear shoes that fit properly and have enough cushioning. Always look in your shoes before you put them on to be sure there are no objects inside.  To break in new shoes, wear them for just a few hours a day. This prevents injuries on your feet. Wounds, scrapes, corns, and calluses  Check your feet daily for blisters, cuts, bruises, sores, and redness. If you cannot see the bottom of your feet, use a mirror or ask someone for help.  Do not cut corns or calluses or try to remove them with medicine.  If you find a minor scrape, cut, or break in the skin on your feet, keep it and the skin around it clean and dry. You may clean these areas with mild soap and water. Do not clean the area with peroxide, alcohol, or iodine.  If you have a wound, scrape, corn, or callus on your foot, look at it several times a day to make sure it is healing and not infected. Check for: ? Redness, swelling, or pain. ? Fluid or blood. ? Warmth. ? Pus or a bad smell.   General tips  Do not cross your legs. This may decrease blood flow to your feet.  Do not use heating pads or hot water bottles on your feet. They may burn your skin. If you have lost feeling in your feet or legs, you may not know this is happening until it is too late.  Protect your feet from hot and cold by wearing shoes, such as at the beach or on hot pavement.  Schedule a complete foot exam at least once a year (annually) or more often if you have foot problems. Report any cuts, sores, or bruises to your health care provider  immediately. Where to find more information  American Diabetes Association: www.diabetes.org  Association of Diabetes Care & Education Specialists: www.diabeteseducator.org Contact a health care provider if:  You have a medical condition that increases your risk of infection and you have any cuts, sores, or bruises on your feet.  You have an injury that is not healing.  You have redness on your legs or feet.  You feel burning or tingling in your legs or feet.  You have pain or cramps in your legs and feet.  Your legs or feet are numb.  Your feet always feel cold.  You have pain around any toenails. Get help right away if:  You have a wound, scrape, corn, or callus on your foot and: ? You have pain, swelling, or redness that gets worse. ?  You have fluid or blood coming from the wound, scrape, corn, or callus. ? Your wound, scrape, corn, or callus feels warm to the touch. ? You have pus or a bad smell coming from the wound, scrape, corn, or callus. ? You have a fever. ? You have a red line going up your leg. Summary  Check your feet every day for blisters, cuts, bruises, sores, and redness.  Apply a moisturizing lotion or petroleum jelly to the skin on your feet and to dry, brittle toenails.  Wear shoes that fit properly and have enough cushioning.  If you have foot problems, report any cuts, sores, or bruises to your health care provider immediately.  Schedule a complete foot exam at least once a year (annually) or more often if you have foot problems. This information is not intended to replace advice given to you by your health care provider. Make sure you discuss any questions you have with your health care provider. Document Revised: 04/08/2020 Document Reviewed: 04/08/2020 Elsevier Patient Education  [place] Maintenance, Female Adopting a healthy lifestyle and getting preventive care are important in promoting health and wellness. Ask your  health care provider about:  The right schedule for you to have regular tests and exams.  Things you can do on your own to prevent diseases and keep yourself healthy. What should I know about diet, weight, and exercise? Eat a healthy diet  Eat a diet that includes plenty of vegetables, fruits, low-fat dairy products, and lean protein.  Do not eat a lot of foods that are high in solid fats, added sugars, or sodium.   Maintain a healthy weight Body mass index (BMI) is used to identify weight problems. It estimates body fat based on height and weight. Your health care provider can help determine your BMI and help you achieve or maintain a healthy weight. Get regular exercise Get regular exercise. This is one of the most important things you can do for your health. Most adults should:  Exercise for at least 150 minutes each week. The exercise should increase your heart rate and make you sweat (moderate-intensity exercise).  Do strengthening exercises at least twice a week. This is in addition to the moderate-intensity exercise.  Spend less time sitting. Even light physical activity can be beneficial. Watch cholesterol and blood lipids Have your blood tested for lipids and cholesterol at 61 years of age, then have this test every 5 years. Have your cholesterol levels checked more often if:  Your lipid or cholesterol levels are high.  You are older than 61 years of age.  You are at high risk for heart disease. What should I know about cancer screening? Depending on your health history and family history, you may need to have cancer screening at various ages. This may include screening for:  Breast cancer.  Cervical cancer.  Colorectal cancer.  Skin cancer.  Lung cancer. What should I know about heart disease, diabetes, and high blood pressure? Blood pressure and heart disease  High blood pressure causes heart disease and increases the risk of stroke. This is more likely to  develop in people who have high blood pressure readings, are of African descent, or are overweight.  Have your blood pressure checked: ? Every 3-5 years if you are 67-67 years of age. ? Every year if you are 2 years old or older. Diabetes Have regular diabetes screenings. This checks your fasting blood sugar level. Have the screening done:  Once every  three years after age 27 if you are at a normal weight and have a low risk for diabetes.  More often and at a younger age if you are overweight or have a high risk for diabetes. What should I know about preventing infection? Hepatitis B If you have a higher risk for hepatitis B, you should be screened for this virus. Talk with your health care provider to find out if you are at risk for hepatitis B infection. Hepatitis C Testing is recommended for:  Everyone born from [birth year] through 1966.  Anyone with known risk factors for hepatitis C. Sexually transmitted infections (STIs)  Get screened for STIs, including gonorrhea and chlamydia, if: ? You are sexually active and are younger than 61 years of age. ? You are older than 61 years of age and your health care provider tells you that you are at risk for this type of infection. ? Your sexual activity has changed since you were last screened, and you are at increased risk for chlamydia or gonorrhea. Ask your health care provider if you are at risk.  Ask your health care provider about whether you are at high risk for HIV. Your health care provider may recommend a prescription medicine to help prevent HIV infection. If you choose to take medicine to prevent HIV, you should first get tested for HIV. You should then be tested every 3 months for as long as you are taking the medicine. Pregnancy  If you are about to stop having your period (premenopausal) and you may become pregnant, seek counseling before you get pregnant.  Take 400 to 800 micrograms (mcg) of folic acid every day if you become  pregnant.  Ask for birth control (contraception) if you want to prevent pregnancy. Osteoporosis and menopause Osteoporosis is a disease in which the bones lose minerals and strength with aging. This can result in bone fractures. If you are 49 years old or older, or if you are at risk for osteoporosis and fractures, ask your health care provider if you should:  Be screened for bone loss.  Take a calcium or vitamin D supplement to lower your risk of fractures.  Be given hormone replacement therapy (HRT) to treat symptoms of menopause. Follow these instructions at home: Lifestyle  Do not use any products that contain nicotine or tobacco, such as cigarettes, e-cigarettes, and chewing tobacco. If you need help quitting, ask your health care provider.  Do not use street drugs.  Do not share needles.  Ask your health care provider for help if you need support or information about quitting drugs. Alcohol use  Do not drink alcohol if: ? Your health care provider tells you not to drink. ? You are pregnant, may be pregnant, or are planning to become pregnant.  If you drink alcohol: ? Limit how much you use to 0-1 drink a day. ? Limit intake if you are breastfeeding.  Be aware of how much alcohol is in your drink. In the [place] one drink equals one 12 oz bottle of beer (355 mL), one 5 oz glass of wine (148 mL), or one 1 oz glass of hard liquor (44 mL). General instructions  Schedule regular health, dental, and eye exams.  Stay current with your vaccines.  Tell your health care provider if: ? You often feel depressed. ? You have ever been abused or do not feel safe at home. Summary  Adopting a healthy lifestyle and getting preventive care are important in promoting health and wellness.  Follow your health care provider's instructions about healthy diet, exercising, and getting tested or screened for diseases.  Follow your health care provider's instructions on monitoring your  cholesterol and blood pressure. This information is not intended to replace advice given to you by your health care provider. Make sure you discuss any questions you have with your health care provider. Document Revised: 09/11/2018 Document Reviewed: 09/11/2018 Elsevier Patient Education  2021 [employer].

## 2021-02-22 ENCOUNTER — Encounter: Payer: Self-pay | Admitting: Family Medicine

## 2021-02-22 ENCOUNTER — Other Ambulatory Visit: Payer: Self-pay

## 2021-02-22 ENCOUNTER — Ambulatory Visit (INDEPENDENT_AMBULATORY_CARE_PROVIDER_SITE_OTHER): Payer: Medicare Other | Admitting: Family Medicine

## 2021-02-22 ENCOUNTER — Telehealth: Payer: Self-pay

## 2021-02-22 VITALS — BP 125/69 | HR 70 | Ht 64.0 in

## 2021-02-22 DIAGNOSIS — E785 Hyperlipidemia, unspecified: Secondary | ICD-10-CM | POA: Diagnosis not present

## 2021-02-22 DIAGNOSIS — R7301 Impaired fasting glucose: Secondary | ICD-10-CM

## 2021-02-22 DIAGNOSIS — M21962 Unspecified acquired deformity of left lower leg: Secondary | ICD-10-CM

## 2021-02-22 DIAGNOSIS — Z13220 Encounter for screening for lipoid disorders: Secondary | ICD-10-CM | POA: Diagnosis not present

## 2021-02-22 DIAGNOSIS — R42 Dizziness and giddiness: Secondary | ICD-10-CM

## 2021-02-22 LAB — POCT GLYCOSYLATED HEMOGLOBIN (HGB A1C): HbA1c, POC (prediabetic range): 5.8 % (ref 5.7–6.4)

## 2021-02-22 NOTE — Assessment & Plan Note (Signed)
It has been years since we have checked her lipids she is fasting today so we will go ahead and try to get that updated today.  They were mildly elevated in the past.  Hopefully with recent dietary changes they look great.

## 2021-02-22 NOTE — Patient Instructions (Signed)
[employer] is the foot pad company.

## 2021-02-22 NOTE — Progress Notes (Signed)
Vertigo is better. She has been more active, exercising more,changed her diet.

## 2021-02-22 NOTE — Progress Notes (Signed)
Established Patient Office Visit  Subjective:  Patient ID: Autumn Garcia, female    DOB: 1960/11/10  Age: 60 y.o. MRN: [id number]  CC:  Chief Complaint  Patient presents with  . Follow-up    HPI Autumn Garcia presents for   Her vertigo is much better.  Says she finally figured out that it was the [employer] generic antacid.  She said she would take it for about a week and then start to get symptoms and then stop it and feel better.  She even restarted it a couple times just to make sure that it was the medication causing the side effect.  Impaired fasting glucose- Labs in [place] showed a1c of 6.0.. no increased thirst or urination. No symptoms consistent with hypoglycemia.  She says she is really been working on eating a low-carb diet and eating small meals throughout the day.  She is also been exercising for 30 minutes a day following exercise videos.  She is noted that her blood pressure has improved as well.  Left foot deformity -she says she also just has not been able to get great relief with her left foot.  She is starting to get a hammertoe on her second toe.  She has some tears in the plantar fascia.  She has been wearing special inserts in her shoes which has helped some and she is actually been taping her toes.  Past Medical History:  Diagnosis Date  . Anxiety   . Chronic kidney disease   . Depression   . Eating disorder   . Epilepsy ([place])   . HSV infection   . Kidney stones   . Murmur   . PTSD (post-traumatic stress disorder)   . Skin cancer of forehead    If could be a more serious cancer and will not know until after surgery  . TB (tuberculosis)    Tested positive and treated    Past Surgical History:  Procedure Laterality Date  . CERVICAL POLYPECTOMY    . CHOLECYSTECTOMY    . KIDNEY SURGERY    . LAPAROSCOPIC NEPHRECTOMY  2010.     Dr. Angeline Slim  at [facility] for large stone  . left knee surgery    . Removal of basil cell carcinoma      Family  History  Problem Relation Age of Onset  . Bipolar disorder Mother   . Heart attack Mother        MI in her late 78s  . Skin cancer Mother   . Bipolar disorder Sister   . Dementia Brother   . Cancer Cousin        breast  . Hypertension Brother   . Cancer Sister        breast,brain,cervical ovarian  . Cancer Maternal Aunt        breast  . Breast cancer Maternal Grandmother   . Anemia Neg Hx   . Arrhythmia Neg Hx   . Asthma Neg Hx   . Clotting disorder Neg Hx   . Fainting Neg Hx   . Heart disease Neg Hx   . Heart failure Neg Hx   . Hyperlipidemia Neg Hx     Social History   Socioeconomic History  . Marital status: Married    Spouse name: [name]  . Number of children: 0  . Years of education: 47  . Highest education level: Some college, no degree  Occupational History  . Occupation: disability    Employer: UNEMPLOYED  Tobacco Use  .  Smoking status: Never Smoker  . Smokeless tobacco: Never Used  Vaping Use  . Vaping Use: Never used  Substance and Sexual Activity  . Alcohol use: Not Currently    Alcohol/week: 0.0 standard drinks    Comment: Occasional wine  . Drug use: No  . Sexual activity: Yes    Partners: Male    Birth control/protection: Post-menopausal  Other Topics Concern  . Not on file  Social History Narrative   Lives with her husband. Enjoys gardening and painting.    Social Determinants of Health   Financial Resource Strain: Low Risk   . Difficulty of Paying Living Expenses: Not hard at all  Food Insecurity: No Food Insecurity  . Worried About Charity fundraiser in the Last Year: Never true  . Ran Out of Food in the Last Year: Never true  Transportation Needs: No Transportation Needs  . Lack of Transportation (Medical): No  . Lack of Transportation (Non-Medical): No  Physical Activity: Sufficiently Active  . Days of Exercise per Week: 7 days  . Minutes of Exercise per Session: 30 min  Stress: No Stress Concern Present  . Feeling of Stress :  Not at all  Social Connections: Socially Isolated  . Frequency of Communication with Friends and Family: Never  . Frequency of Social Gatherings with Friends and Family: Never  . Attends Religious Services: Never  . Active Member of Clubs or Organizations: No  . Attends Archivist Meetings: Never  . Marital Status: Married  Human resources officer Violence: Not At Risk  . Fear of Current or Ex-Partner: No  . Emotionally Abused: No  . Physically Abused: No  . Sexually Abused: No    Outpatient Medications Prior to Visit  Medication Sig Dispense Refill  . diazepam (VALIUM) 2 MG tablet Take 1 tablet (2 mg total) by mouth every 8 (eight) hours as needed for anxiety. 90 tablet 0  . EPINEPHrine 0.3 mg/0.3 mL IJ SOAJ injection USE AS DIRECTED AND AS NEEDED FOR SEVERE ALLERGIC REACTION 2 each prn  . estradiol (ESTRACE) 0.1 MG/GM vaginal cream Place vaginally.    Marland Kitchen L-Lysine 500 MG TABS Take 500 mg by mouth. 2 times a week    . fluticasone (FLONASE) 50 MCG/ACT nasal spray SPRAY 2 SPRAYS INTO EACH NOSTRIL EVERY DAY (Patient not taking: Reported on 02/14/2021) 16 mL 0   No facility-administered medications prior to visit.    Allergies  Allergen Reactions  . Demerol [Meperidine] Nausea And Vomiting, Nausea Only and Other (See Comments)    seizures  . Latex Rash  . Nickel     infections  . Ciprofloxacin     REACTION: sucidal  . Citrus     Geographic tongue  . Codeine     REACTION: Halluncination  . Erythromycin     REACTION: Vomiting  . Lidocaine Other (See Comments)    Pt unsure if allergic. Last reaction at the dentist and had a seizure, but could have been injected into a vein  . Meperidine Hcl     REACTION: Muscle weakness, vomiting  . Peanut Oil     Other reaction(s): Other (See Comments) ORAL ULCERS  . Peanut-Containing Drug Products Nausea Only  . Soy Allergy     Other reaction(s): Other (See Comments) MOUTH ULCER    ROS Review of Systems    Objective:     Physical Exam Constitutional:      Appearance: She is well-developed.  HENT:     Head: Normocephalic and atraumatic.  Cardiovascular:     Rate and Rhythm: Normal rate and regular rhythm.     Heart sounds: Normal heart sounds.  Pulmonary:     Effort: Pulmonary effort is normal.     Breath sounds: Normal breath sounds.  Skin:    General: Skin is warm and dry.  Neurological:     Mental Status: She is alert and oriented to person, place, and time.  Psychiatric:        Behavior: Behavior normal.     BP 125/69   Pulse 70   Ht 5\' 4"  (1.626 m)   SpO2 100%   BMI 28.32 kg/m  Wt Readings from Last 3 Encounters:  11/15/20 165 lb (74.8 kg)  07/26/20 160 lb (72.6 kg)  07/24/20 170 lb (77.1 kg)     There are no preventive care reminders to display for this patient.  There are no preventive care reminders to display for this patient.  Lab Results  Component Value Date   TSH 1.576 05/17/2015   Lab Results  Component Value Date   WBC 6.0 05/17/2015   HGB 14.0 05/17/2015   HCT 42.5 05/17/2015   MCV 90.4 05/17/2015   PLT 260 05/17/2015   Lab Results  Component Value Date   NA 138 05/17/2015   K 3.7 05/17/2015   CO2 24 05/17/2015   GLUCOSE 95 05/17/2015   BUN 11 05/17/2015   CREATININE 0.88 05/17/2015   BILITOT 0.5 05/17/2015   ALKPHOS 89 05/17/2015   AST 26 05/17/2015   ALT 26 05/17/2015   PROT 7.5 05/17/2015   ALBUMIN 4.5 05/17/2015   CALCIUM 9.5 05/17/2015   Lab Results  Component Value Date   CHOL 220 (H) 05/17/2015   Lab Results  Component Value Date   HDL 73 05/17/2015   Lab Results  Component Value Date   LDLCALC 132 (H) 05/17/2015   Lab Results  Component Value Date   TRIG 77 05/17/2015   Lab Results  Component Value Date   CHOLHDL 3.0 05/17/2015   Lab Results  Component Value Date   HGBA1C 5.8 02/22/2021      Assessment & Plan:   Problem List Items Addressed This Visit      Endocrine   IFG (impaired fasting glucose) - Primary     A1c looks great today it is down from 6.0-5.8 which is great progress just continue to work on this healthy dietary changes and weight loss.      Relevant Orders   POCT glycosylated hemoglobin (Hb A1C) (Completed)   COMPLETE METABOLIC PANEL WITH GFR   Lipid panel     Other   Vertigo    Seems to be a medication side effect.  She is doing much better now that she is off the antacid.      Hyperlipidemia    It has been years since we have checked her lipids she is fasting today so we will go ahead and try to get that updated today.  They were mildly elevated in the past.  Hopefully with recent dietary changes they look great.      Relevant Orders   COMPLETE METABOLIC PANEL WITH GFR   Lipid panel    Other Visit Diagnoses    Screening, lipid       Acquired foot deformity, left         Left foot deformity-I am happy to refer her to podiatry at any point that she would like.  I think wearing good support and providing support  to the distal arch as well as the proximal arch is really important.  Unfortunately I do not think anything will change the current deformity but hopefully can keep it from progressing.  No orders of the defined types were placed in this encounter.   Follow-up: Return in about 6 months (around 08/25/2021) for Prediabetes check up  and BP check .    Beatrice Lecher, MD

## 2021-02-22 NOTE — Assessment & Plan Note (Signed)
Seems to be a medication side effect.  She is doing much better now that she is off the antacid.

## 2021-02-22 NOTE — Assessment & Plan Note (Signed)
A1c looks great today it is down from 6.0-5.8 which is great progress just continue to work on this healthy dietary changes and weight loss.

## 2021-02-22 NOTE — Telephone Encounter (Signed)
Patient left paperwork containing information to her Tdap Vaccine and stated that Dr. Madilyn Fireman has to have this to go in her file- paperwork placed in the message box- tvt

## 2021-02-23 LAB — COMPLETE METABOLIC PANEL WITH GFR
AG Ratio: 2.3 (calc) (ref 1.0–2.5)
ALT: 38 U/L — ABNORMAL HIGH (ref 6–29)
AST: 31 U/L (ref 10–35)
Albumin: 5 g/dL (ref 3.6–5.1)
Alkaline phosphatase (APISO): 103 U/L (ref 37–153)
BUN: 11 mg/dL (ref 7–25)
CO2: 28 mmol/L (ref 20–32)
Calcium: 9.8 mg/dL (ref 8.6–10.4)
Chloride: 104 mmol/L (ref 98–110)
Creat: 0.83 mg/dL (ref 0.50–0.99)
GFR, Est African American: 89 mL/min/{1.73_m2} (ref >=60)
GFR, Est Non African American: 77 mL/min/{1.73_m2} (ref >=60)
Globulin: 2.2 g/dL (calc) (ref 1.9–3.7)
Glucose, Bld: 106 mg/dL — ABNORMAL HIGH (ref 65–99)
Potassium: 4.6 mmol/L (ref 3.5–5.3)
Sodium: 140 mmol/L (ref 135–146)
Total Bilirubin: 0.4 mg/dL (ref 0.2–1.2)
Total Protein: 7.2 g/dL (ref 6.1–8.1)

## 2021-02-23 LAB — LIPID PANEL
Cholesterol: 236 mg/dL — ABNORMAL HIGH (ref <200)
HDL: 65 mg/dL (ref >=50)
LDL Cholesterol (Calc): 155 mg/dL (calc) — ABNORMAL HIGH
Non-HDL Cholesterol (Calc): 171 mg/dL (calc) — ABNORMAL HIGH (ref <130)
Total CHOL/HDL Ratio: 3.6 (calc) (ref <5.0)
Triglycerides: 66 mg/dL (ref <150)

## 2021-02-24 ENCOUNTER — Other Ambulatory Visit: Payer: Self-pay | Admitting: *Deleted

## 2021-02-24 DIAGNOSIS — R7401 Elevation of levels of liver transaminase levels: Secondary | ICD-10-CM

## 2021-03-03 NOTE — Telephone Encounter (Signed)
Pts chart updated

## 2021-05-06 ENCOUNTER — Ambulatory Visit (INDEPENDENT_AMBULATORY_CARE_PROVIDER_SITE_OTHER): Payer: Medicare Other | Admitting: Medical-Surgical

## 2021-05-06 ENCOUNTER — Encounter: Payer: Self-pay | Admitting: Medical-Surgical

## 2021-05-06 VITALS — BP 152/87 | HR 81 | Resp 20

## 2021-05-06 DIAGNOSIS — N61 Mastitis without abscess: Secondary | ICD-10-CM | POA: Diagnosis not present

## 2021-05-06 MED ORDER — TRIAMCINOLONE ACETONIDE 0.5 % EX CREA: 1.0000 "application " | TOPICAL_CREAM | Freq: Two times a day (BID) | CUTANEOUS | 0 refills | Status: DC

## 2021-05-06 MED ORDER — AMOXICILLIN 500 MG PO CAPS: 500.0000 mg | ORAL_CAPSULE | Freq: Three times a day (TID) | ORAL | 0 refills | Status: AC

## 2021-05-06 MED ORDER — TRIAMCINOLONE ACETONIDE 0.1 % EX CREA: 1.0000 "application " | TOPICAL_CREAM | Freq: Every day | CUTANEOUS | 1 refills | Status: DC | PRN

## 2021-05-06 NOTE — Progress Notes (Signed)
  HPI with pertinent ROS:   CC: Bug bite?  HPI: Pleasant 60 year old female presenting today for evaluation of a bug bite.  She noticed it about 5 to 7 days ago after being outside for a while.  The bite is located on her right breast just medial to the nipple.  It originally look like a small red dot that was not itchy or painful.  After a couple of days, the spot was slightly larger.  She did apply mupirocin ointment which seemed to worsen the redness.  The area of redness has continued to get bigger so she got worried.  She first thought it was a bug from outside that bit her but then considered a possible bedbug.  Denies fever, chills, chest pain, shortness of breath, and myalgias.  I reviewed the past medical history, family history, social history, surgical history, and allergies today and no changes were needed.  Please see the problem list section below in epic for further details.   Physical exam:   General: Well Developed, well nourished, and in no acute distress.  Neuro: Alert and oriented x3.  HEENT: Normocephalic, atraumatic.  Skin: Warm and dry.  Approximately 2 cm x 3 cm macular area of erythema medial to the right nipple, no edema, drainage, or excessive warmth. Cardiac: Regular rate and rhythm, no murmurs rubs or gallops, no lower extremity edema.  Respiratory: Clear to auscultation bilaterally. Not using accessory muscles, speaking in full sentences.  Impression and Recommendations:    1. Cellulitis of breast Does resemble a bug bite but unable to determine specific insect.  Treating with amoxicillin 500 mg 3 times daily x7 days per patient request since Keflex does not sit well in her stomach.  Advised patient to monitor for worsening symptoms or the development of new symptoms.  Per her request, refilling triamcinolone creams that she uses  as needed for bug bites (has them on hand but they have expired).  Return if symptoms worsen or fail to  improve. ___________________________________________ Clearnce Sorrel, DNP, APRN, FNP-BC Primary Care and [place]

## 2021-05-13 ENCOUNTER — Encounter: Payer: Self-pay | Admitting: Medical-Surgical

## 2021-06-03 ENCOUNTER — Other Ambulatory Visit: Payer: Self-pay

## 2021-06-03 ENCOUNTER — Other Ambulatory Visit: Payer: Medicare Other

## 2021-06-03 DIAGNOSIS — R7401 Elevation of levels of liver transaminase levels: Secondary | ICD-10-CM | POA: Diagnosis not present

## 2021-06-04 LAB — ALT: ALT: 32 U/L — ABNORMAL HIGH (ref 6–29)

## 2021-06-07 NOTE — Progress Notes (Signed)
ALT enzyme is still mildly elevated but better and almost back to normal. Plan to recheck again in 6 months.

## 2021-06-10 ENCOUNTER — Ambulatory Visit (INDEPENDENT_AMBULATORY_CARE_PROVIDER_SITE_OTHER): Payer: Medicare Other

## 2021-06-10 ENCOUNTER — Ambulatory Visit (INDEPENDENT_AMBULATORY_CARE_PROVIDER_SITE_OTHER): Payer: Medicare Other | Admitting: Sports Medicine

## 2021-06-10 ENCOUNTER — Other Ambulatory Visit: Payer: Self-pay

## 2021-06-10 DIAGNOSIS — M79672 Pain in left foot: Secondary | ICD-10-CM

## 2021-06-10 DIAGNOSIS — M7542 Impingement syndrome of left shoulder: Secondary | ICD-10-CM | POA: Diagnosis not present

## 2021-06-10 DIAGNOSIS — M25512 Pain in left shoulder: Secondary | ICD-10-CM

## 2021-06-10 DIAGNOSIS — M7741 Metatarsalgia, right foot: Secondary | ICD-10-CM | POA: Diagnosis not present

## 2021-06-10 DIAGNOSIS — M7742 Metatarsalgia, left foot: Secondary | ICD-10-CM | POA: Diagnosis not present

## 2021-06-10 DIAGNOSIS — M7740 Metatarsalgia, unspecified foot: Secondary | ICD-10-CM | POA: Insufficient documentation

## 2021-06-10 IMAGING — DX DG FOOT COMPLETE 3+V*L*
3 series · 3 of 3 positions shown · non-contrast
Comparison: None.

CLINICAL DATA: Left foot pain and deformity

EXAM:
LEFT FOOT - COMPLETE 3+ VIEW

[foot ap]
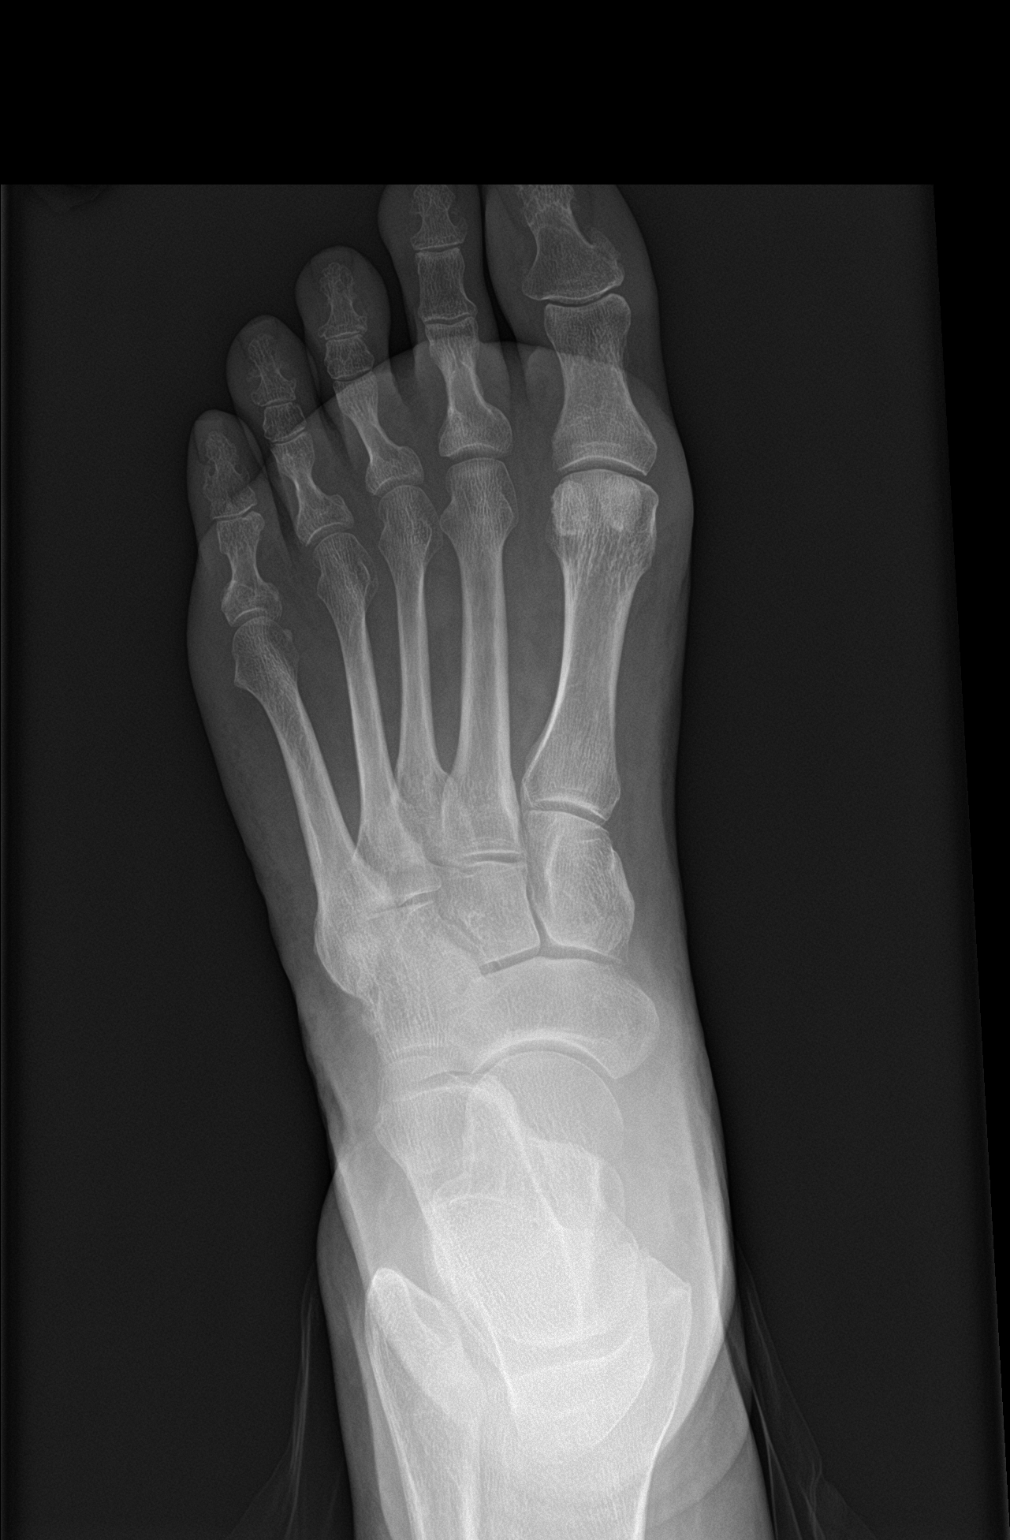

[foot obl]
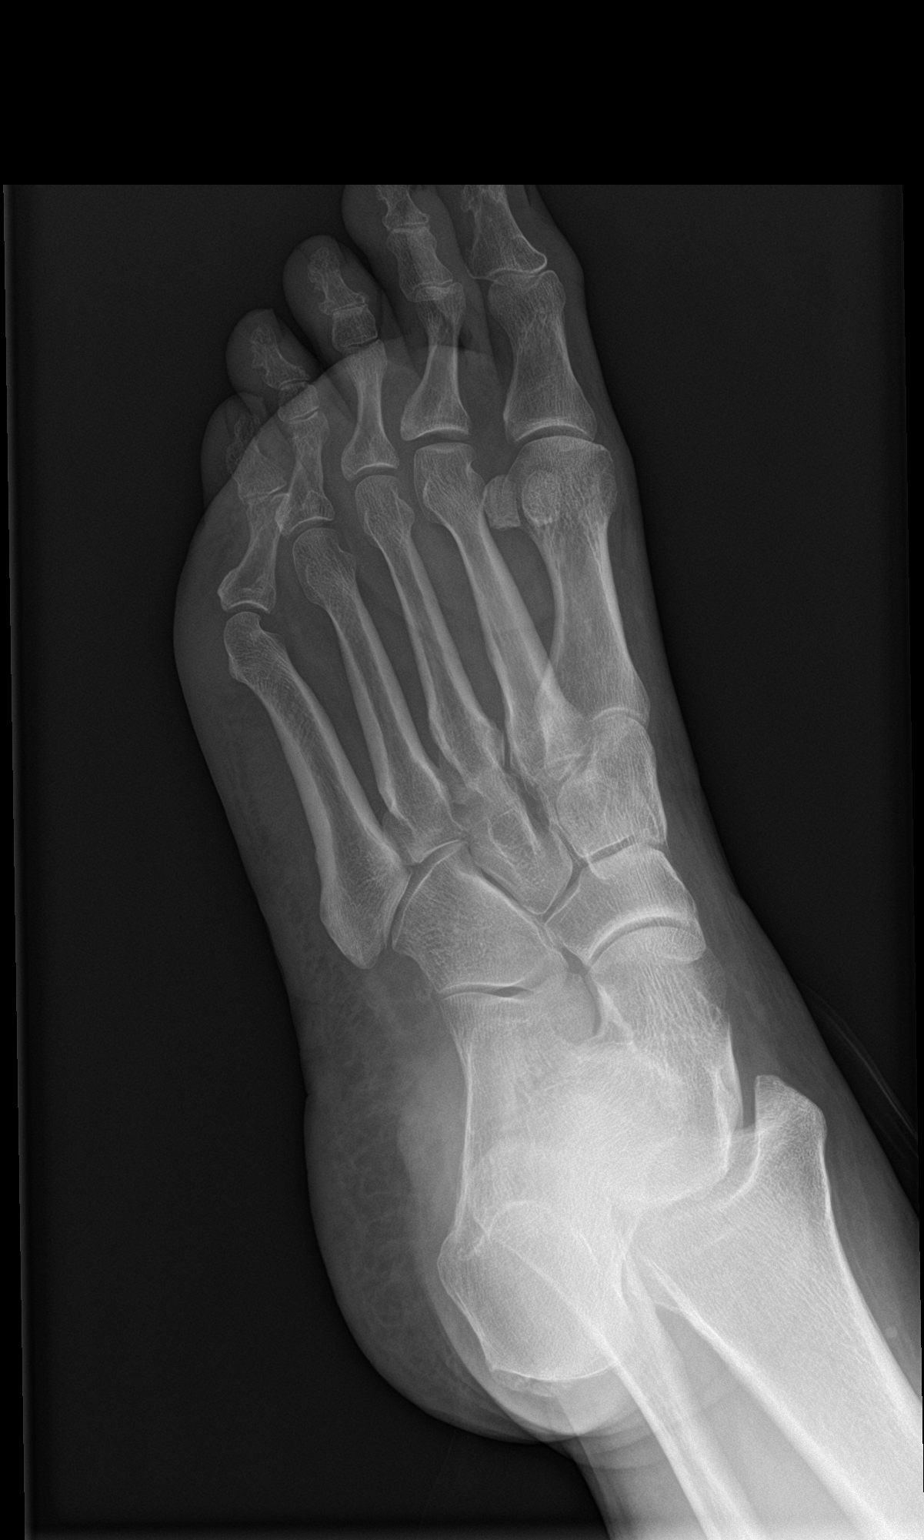

[foot lat]
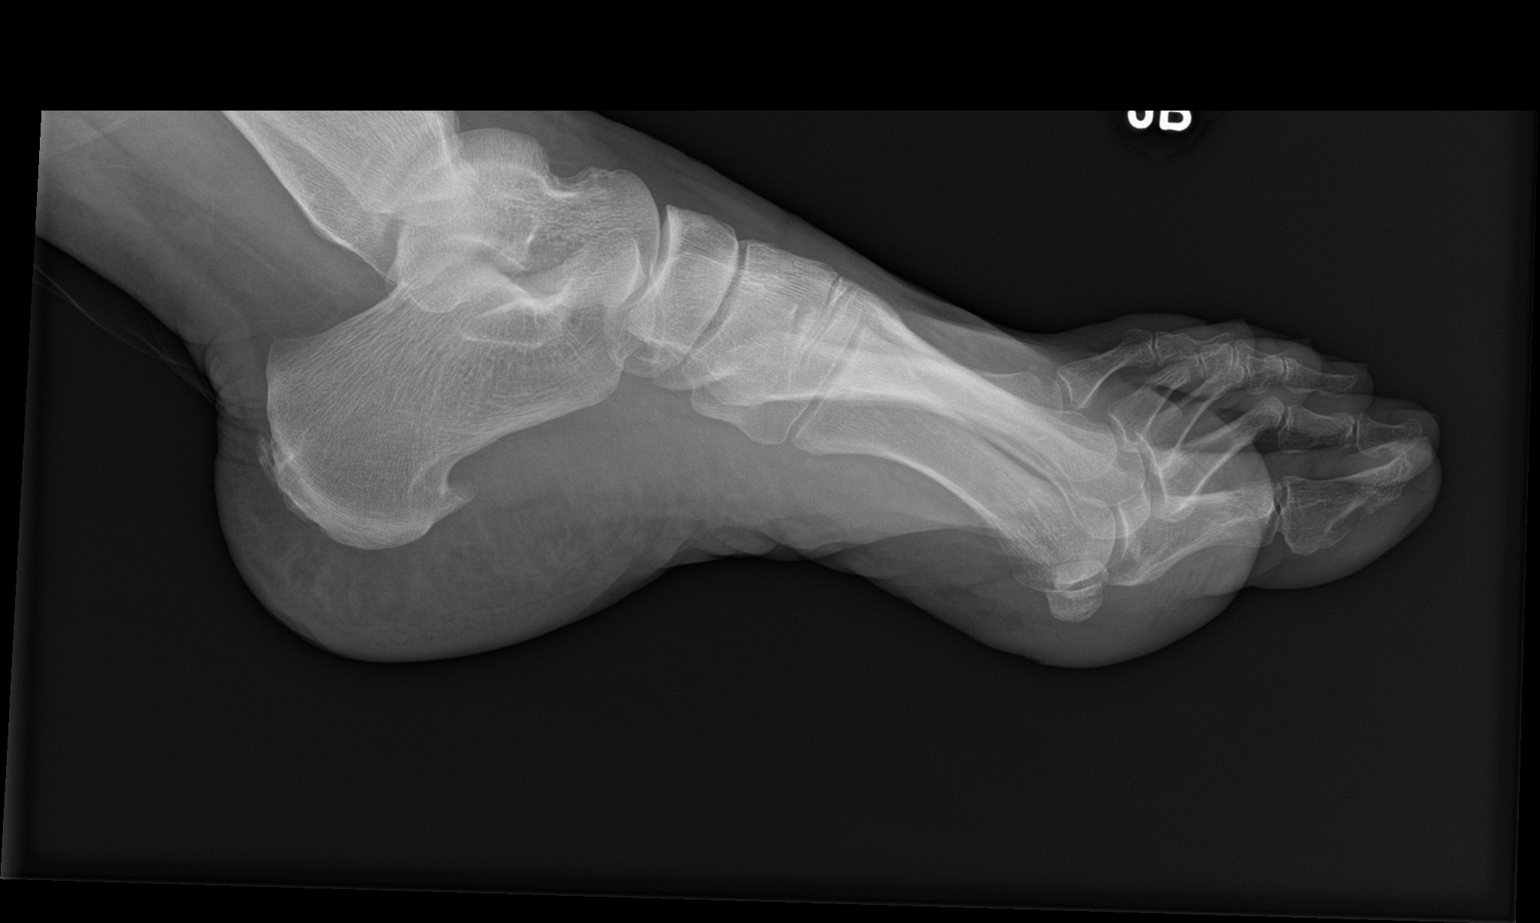

[3 of 3 positions shown; findings below may reference images not displayed]

FINDINGS: Normal alignment. No fracture or dislocation. Joint spaces are
preserved. Soft tissues are unremarkable. Small plantar calcaneal
spur noted.
IMPRESSION: Negative.

## 2021-06-10 MED ORDER — NAPROXEN 500 MG PO TABS: 500.0000 mg | ORAL_TABLET | Freq: Two times a day (BID) | ORAL | 3 refills | Status: DC

## 2021-06-10 NOTE — Assessment & Plan Note (Signed)
Pleasant 60 year old female, 2 months of pain in the left shoulder, localized over the deltoid, worse with overhead activities, no injuries. On exam positive Neer's, Hawkins, empty can signs, positive crank test. We will get x-rays, she will do PT at [facility] per her request, naproxen. Return to see me in 6 weeks, injection if no better.

## 2021-06-10 NOTE — Progress Notes (Signed)
    Procedures performed today:    None.  Independent interpretation of notes and tests performed by another provider:   None.  Brief History, Exam, Impression, and Recommendations:    Impingement syndrome, shoulder, left Pleasant 60 year old female, 2 months of pain in the left shoulder, localized over the deltoid, worse with overhead activities, no injuries. On exam positive Neer's, Hawkins, empty can signs, positive crank test. We will get x-rays, she will do PT at [facility] per her request, naproxen. Return to see me in 6 weeks, injection if no better.  Metatarsalgia Pes cavus with bilateral metatarsalgia, symptoms present for years. She has been seeing a podiatrist. There is some splaying of the toes with weightbearing, she does have loss of her transverse arch left worse than right with abnormal callus second through third metatarsal heads. She has been using kinesiotaping, I think we can discontinue kinesiotaping and use metatarsal pads, I will also get her set up with Dr. Raeford Razor for custom molded orthotics also with metatarsal pads. She has had x-rays that were unremarkable, due to chronicity of discomfort we will also proceed with MRI of the left forefoot.    ___________________________________________ Gwen Her. Dianah Field, M.D., ABFM., CAQSM. Primary Care and [place] Instructor of [place] [facility]

## 2021-06-10 NOTE — Assessment & Plan Note (Signed)
Pes cavus with bilateral metatarsalgia, symptoms present for years. She has been seeing a podiatrist. There is some splaying of the toes with weightbearing, she does have loss of her transverse arch left worse than right with abnormal callus second through third metatarsal heads. She has been using kinesiotaping, I think we can discontinue kinesiotaping and use metatarsal pads, I will also get her set up with Dr. Raeford Razor for custom molded orthotics also with metatarsal pads. She has had x-rays that were unremarkable, due to chronicity of discomfort we will also proceed with MRI of the left forefoot.

## 2021-06-17 DIAGNOSIS — Z1212 Encounter for screening for malignant neoplasm of rectum: Secondary | ICD-10-CM | POA: Diagnosis not present

## 2021-06-17 DIAGNOSIS — Z1211 Encounter for screening for malignant neoplasm of colon: Secondary | ICD-10-CM | POA: Diagnosis not present

## 2021-06-22 DIAGNOSIS — M25512 Pain in left shoulder: Secondary | ICD-10-CM | POA: Diagnosis not present

## 2021-06-22 DIAGNOSIS — M6281 Muscle weakness (generalized): Secondary | ICD-10-CM | POA: Diagnosis not present

## 2021-06-22 DIAGNOSIS — M25612 Stiffness of left shoulder, not elsewhere classified: Secondary | ICD-10-CM | POA: Diagnosis not present

## 2021-06-22 LAB — EXTERNAL GENERIC LAB PROCEDURE: COLOGUARD: NEGATIVE

## 2021-06-22 LAB — COLOGUARD: COLOGUARD: NEGATIVE

## 2021-06-23 ENCOUNTER — Ambulatory Visit: Payer: Medicare Other

## 2021-06-25 ENCOUNTER — Ambulatory Visit: Payer: Medicare Other

## 2021-06-28 ENCOUNTER — Telehealth: Payer: Self-pay | Admitting: Family Medicine

## 2021-06-28 DIAGNOSIS — M25612 Stiffness of left shoulder, not elsewhere classified: Secondary | ICD-10-CM | POA: Diagnosis not present

## 2021-06-28 DIAGNOSIS — M25512 Pain in left shoulder: Secondary | ICD-10-CM | POA: Diagnosis not present

## 2021-06-28 DIAGNOSIS — M6281 Muscle weakness (generalized): Secondary | ICD-10-CM | POA: Diagnosis not present

## 2021-06-28 NOTE — Telephone Encounter (Signed)
Cologuard neg. Repeat in 3 yr.

## 2021-06-29 NOTE — Telephone Encounter (Signed)
Pt advised of results/recommendations.

## 2021-06-30 ENCOUNTER — Other Ambulatory Visit: Payer: Self-pay

## 2021-06-30 ENCOUNTER — Ambulatory Visit (INDEPENDENT_AMBULATORY_CARE_PROVIDER_SITE_OTHER): Payer: Medicare Other

## 2021-06-30 DIAGNOSIS — Z0000 Encounter for general adult medical examination without abnormal findings: Secondary | ICD-10-CM

## 2021-06-30 DIAGNOSIS — Z1231 Encounter for screening mammogram for malignant neoplasm of breast: Secondary | ICD-10-CM

## 2021-06-30 IMAGING — MG MM DIGITAL SCREENING BILAT W/ TOMO AND CAD
8 series · 8 of 24 positions shown · non-contrast
Comparison: Previous exam(s).

CLINICAL DATA: Screening.

EXAM:
DIGITAL SCREENING BILATERAL MAMMOGRAM WITH TOMOSYNTHESIS AND CAD
TECHNIQUE: Bilateral screening digital craniocaudal and mediolateral oblique
mammograms were obtained. Bilateral screening digital breast
tomosynthesis was performed. The images were evaluated with
computer-aided detection.

[R CC synth-2D]
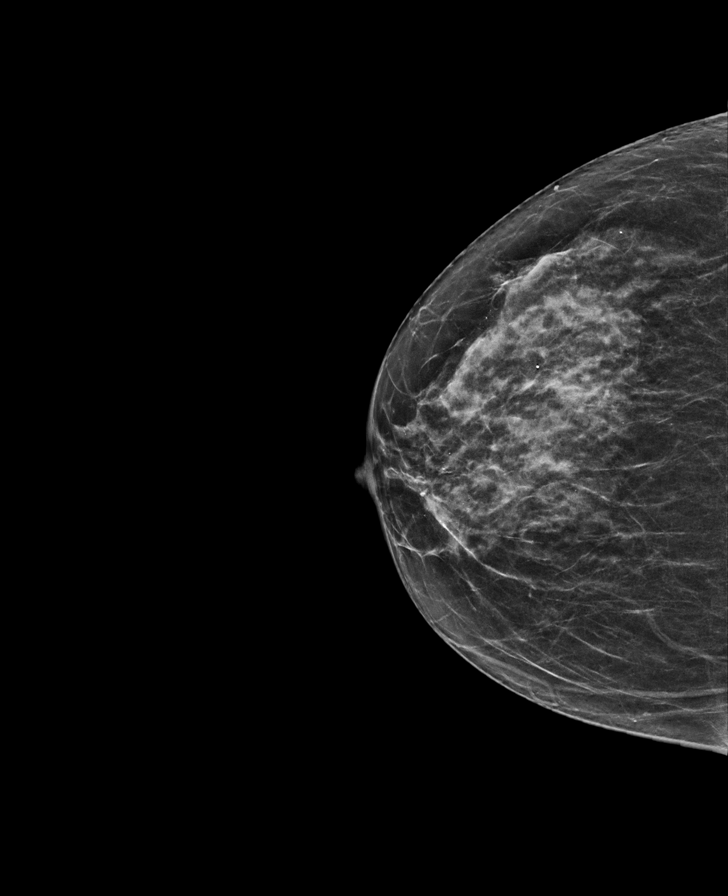

[L MLO synth-2D]
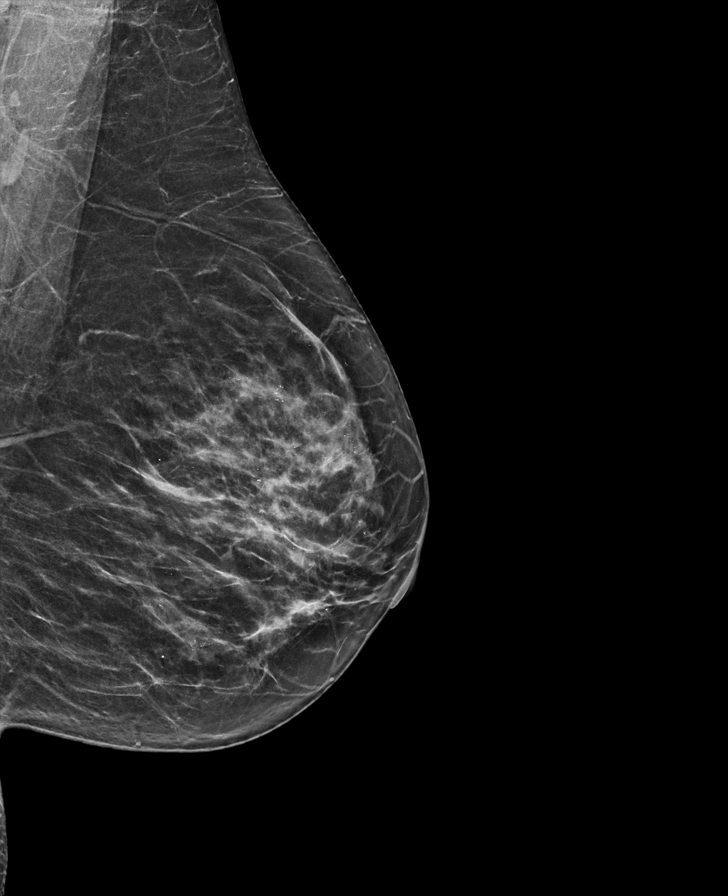

[L CC synth-2D]
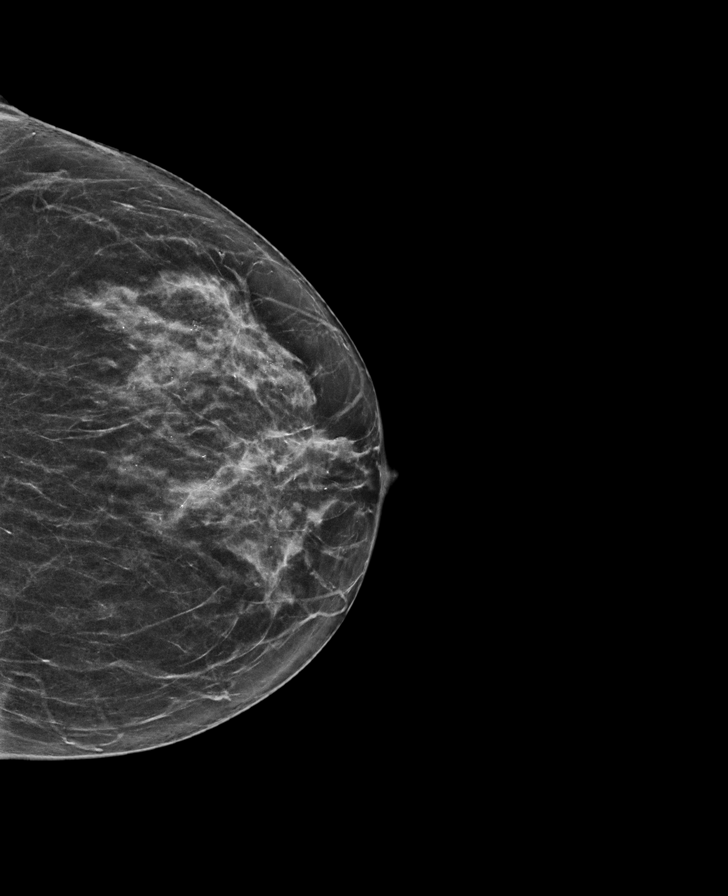

[R MLO synth-2D]
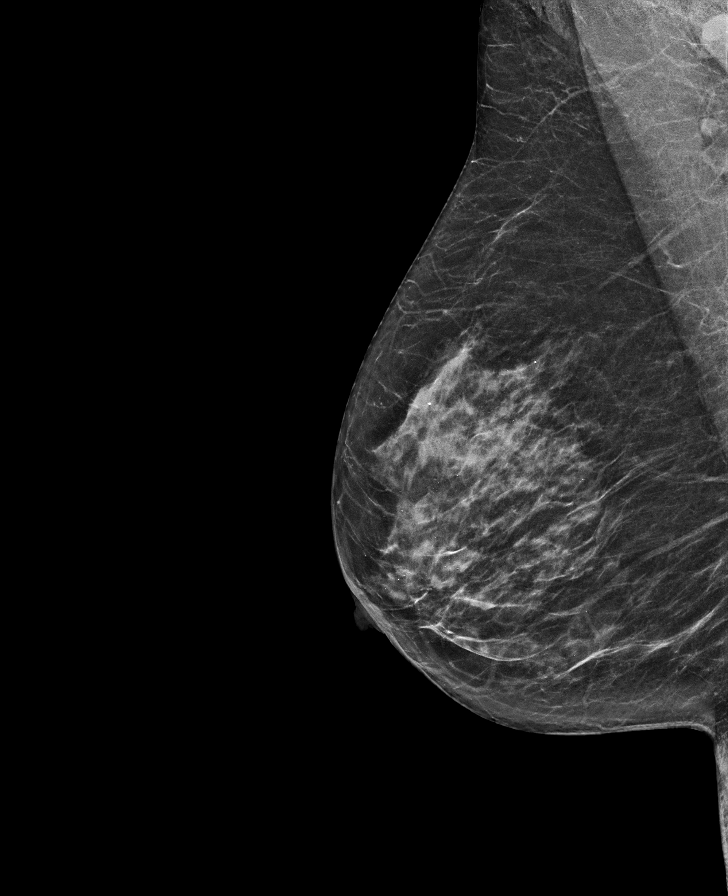

[R MLO tomo · tomo slice 31/61.0]
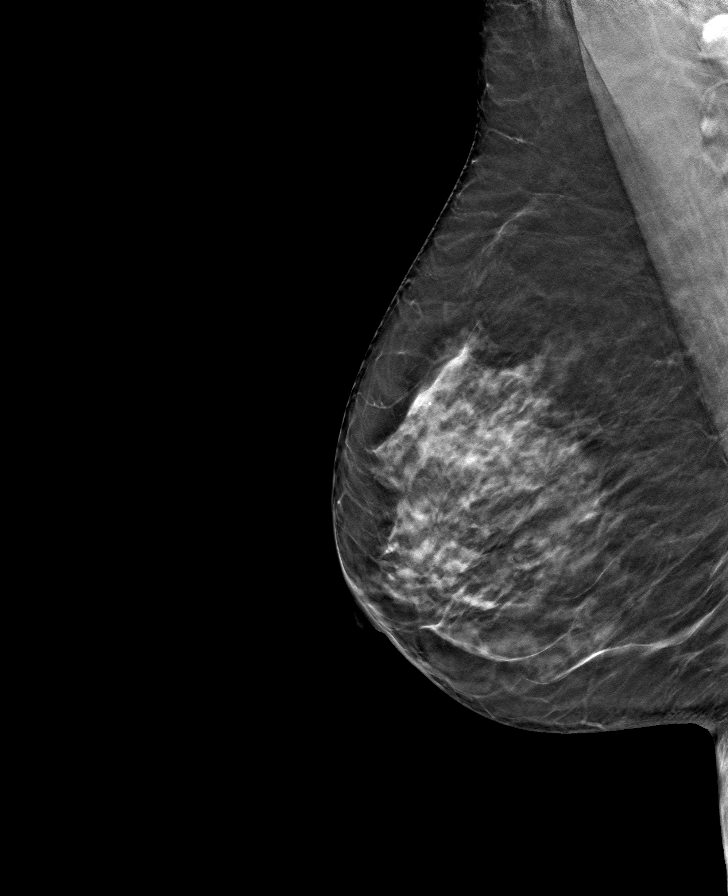

[R CC tomo · tomo slice 29/57.0]
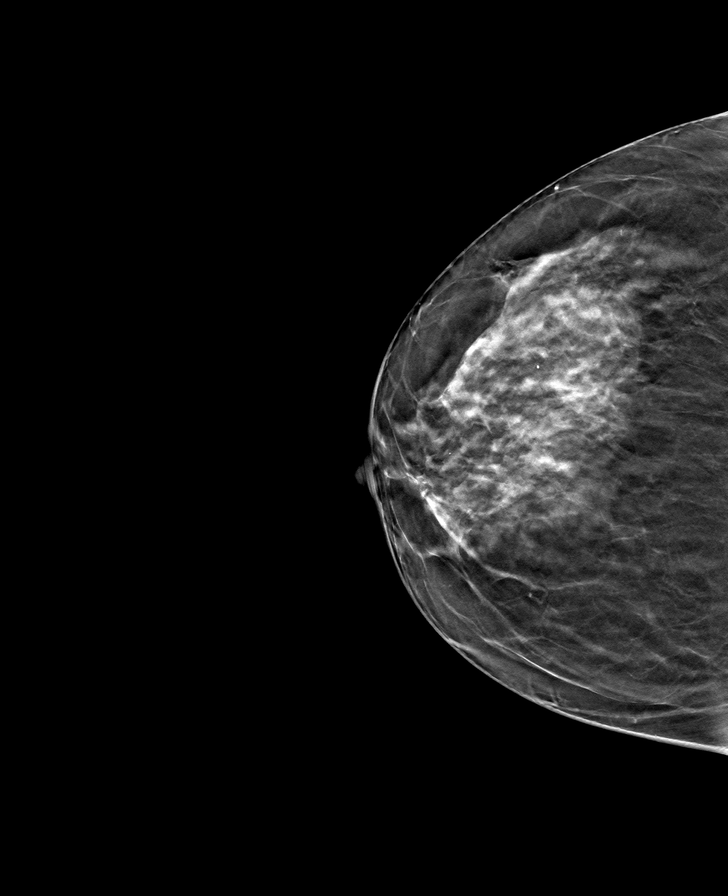

[L MLO tomo · tomo slice 31/60.0]
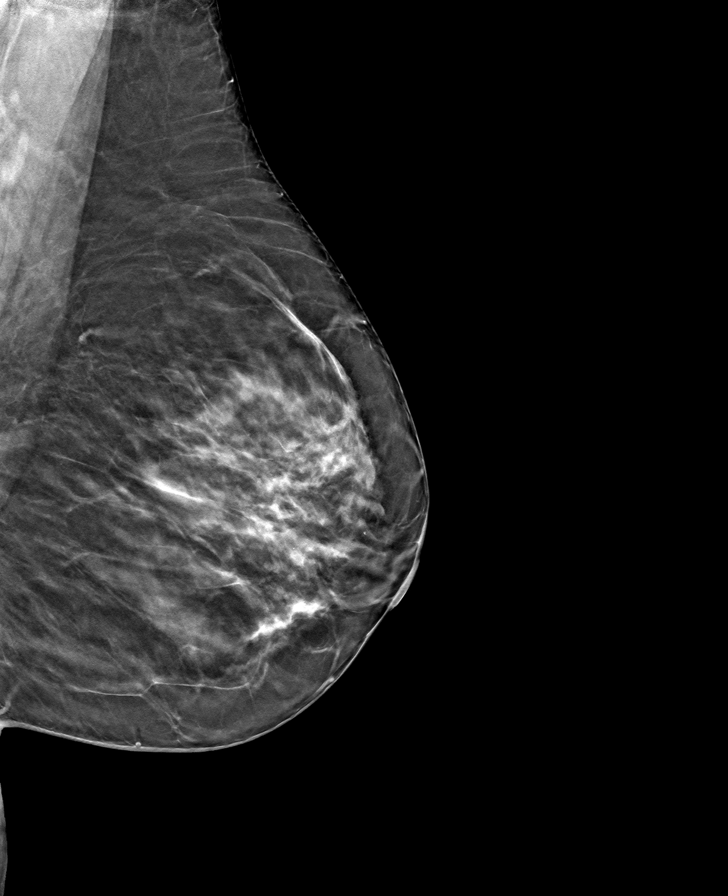

[L CC tomo · tomo slice 31/60.0]
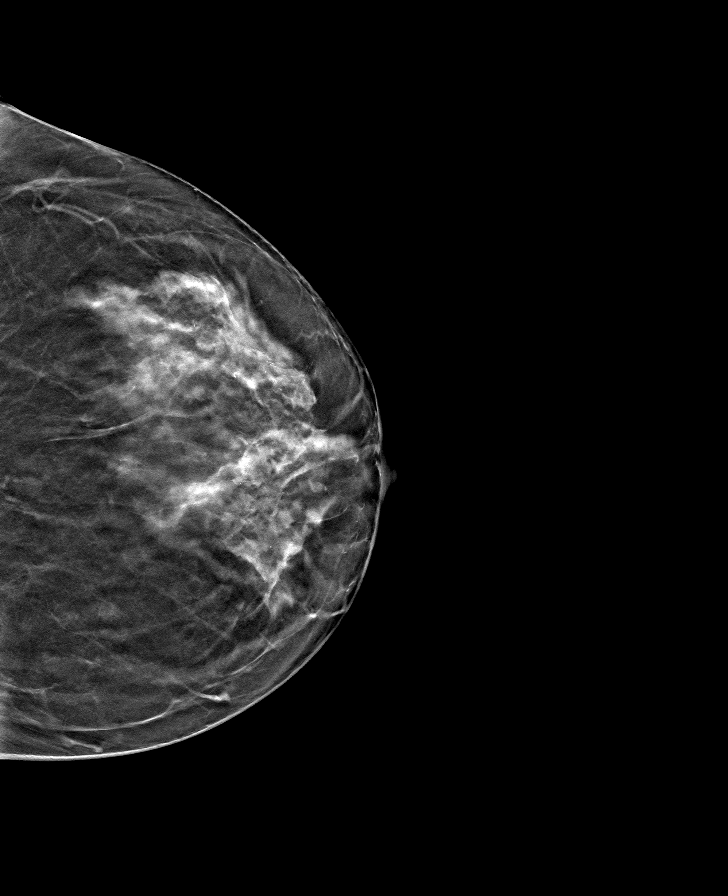

[8 of 24 positions shown; findings below may reference images not displayed]

ACR Breast Density Category c: The breast tissue is heterogeneously
dense, which may obscure small masses.
FINDINGS: There are no findings suspicious for malignancy.
IMPRESSION: No mammographic evidence of malignancy. A result letter of this
screening mammogram will be mailed directly to the patient.

RECOMMENDATION:
Screening mammogram in one year. (Code:[V2])

BI-RADS CATEGORY  1: Negative.

## 2021-07-06 ENCOUNTER — Ambulatory Visit: Payer: Medicare Other | Admitting: Family Medicine

## 2021-07-06 NOTE — Progress Notes (Signed)
Please call patient. Normal mammogram.  Repeat in 1 year.  

## 2021-07-16 ENCOUNTER — Other Ambulatory Visit: Payer: Self-pay

## 2021-07-16 ENCOUNTER — Emergency Department (INDEPENDENT_AMBULATORY_CARE_PROVIDER_SITE_OTHER)
Admission: EM | Admit: 2021-07-16 | Discharge: 2021-07-16 | Disposition: A | Discharge status: Home / Self Care | Payer: Medicare Other | Source: Home / Self Care

## 2021-07-16 ENCOUNTER — Encounter: Payer: Self-pay | Admitting: Emergency Medicine

## 2021-07-16 DIAGNOSIS — J309 Allergic rhinitis, unspecified: Secondary | ICD-10-CM | POA: Diagnosis not present

## 2021-07-16 DIAGNOSIS — R059 Cough, unspecified: Secondary | ICD-10-CM

## 2021-07-16 MED ORDER — BENZONATATE 200 MG PO CAPS: 200.0000 mg | ORAL_CAPSULE | Freq: Three times a day (TID) | ORAL | 0 refills | Status: AC | PRN

## 2021-07-16 MED ORDER — FEXOFENADINE HCL 180 MG PO TABS: 180.0000 mg | ORAL_TABLET | Freq: Every day | ORAL | 0 refills | Status: DC

## 2021-07-16 NOTE — ED Triage Notes (Signed)
Coughing since Thursday  Thick green when pt coughs  No OTC meds  Pt's husband has an URI - started on a Z-Pak  Low grad fever - 99.3 temp yesterday  COVID test at home last night - negative COVID  vaccine  x 2  - no boosters

## 2021-07-16 NOTE — ED Provider Notes (Signed)
[facility]    CSN: [id number] Arrival date & time: 07/16/21  1140      History   Chief Complaint Chief Complaint  Patient presents with   Cough    HPI Autumn Garcia is a 60 y.o. female.   HPI 60 year old female presents with cough for 2 days.  Reports low-grade fever yesterday of 99.3.Marland Kitchen  Past Medical History:  Diagnosis Date   Anxiety    Chronic kidney disease    Depression    Eating disorder    Epilepsy ([place])    HSV infection    Kidney stones    Murmur    PTSD (post-traumatic stress disorder)    Skin cancer of forehead    If could be a more serious cancer and will not know until after surgery   TB (tuberculosis)    Tested positive and treated    Patient Active Problem List   Diagnosis Date Noted   Impingement syndrome, shoulder, left 06/10/2021   Metatarsalgia 06/10/2021   Orthostatic hypotension 01/12/2021   Elevated blood pressure reading 01/12/2021   Recurrent UTI 11/30/2020   IFG (impaired fasting glucose) 11/30/2020   Bee allergy status 09/27/2018   Hyperlipidemia 04/26/2015   Herpes simplex type II infection 04/26/2014   Vertigo 11/13/2013   Vitamin D deficiency 10/29/2013   Basal cell carcinoma of skin 10/29/2013   Ventral hernia without obstruction or gangrene 01/27/2013   Dissociative disorder 06/03/2012   PTSD (post-traumatic stress disorder) 11/15/2011    Class: Chronic   Dissociative identity disorder ([place]) 10/06/2011    Class: Chronic   Borderline personality disorder ([place]) 10/03/2011   HOT FLASHES 09/03/2009   ANXIETY DISORDER, GENERALIZED 06/14/2009   KNEE PAIN 01/26/2009   Other convulsions 01/26/2009    Past Surgical History:  Procedure Laterality Date   CERVICAL POLYPECTOMY     [place]  2010.     Dr. Angeline Slim  at [facility] for large stone   left knee surgery     Removal of basil cell carcinoma      OB History     Gravida  3   Para  0   Term       Preterm      AB  3   Living  0      SAB  1   IAB  2   Ectopic      Multiple      Live Births               Home Medications    Prior to Admission medications   Medication Sig Start Date End Date Taking? Authorizing Provider  benzonatate (TESSALON) 200 MG capsule Take 1 capsule (200 mg total) by mouth 3 (three) times daily as needed for up to 7 days for cough. 07/16/21 07/23/21 Yes Eliezer Lofts, FNP  fexofenadine [facility] ALLERGY) 180 MG tablet Take 1 tablet (180 mg total) by mouth daily for 15 days. 07/16/21 07/31/21 Yes Eliezer Lofts, FNP  diazepam (VALIUM) 2 MG tablet Take 1 tablet (2 mg total) by mouth every 8 (eight) hours as needed for anxiety. 03/03/20   Hali Marry, MD  EPINEPHrine 0.3 mg/0.3 mL IJ SOAJ injection USE AS DIRECTED AND AS NEEDED FOR SEVERE ALLERGIC REACTION 03/02/20   Hali Marry, MD  estradiol (ESTRACE) 0.1 MG/GM vaginal cream Place vaginally. Patient not taking: Reported on 07/16/2021 11/03/20 11/03/21  [provider]  L-Lysine 500 MG  TABS Take 500 mg by mouth. 2 times a week    [provider]  naproxen (NAPROSYN) 500 MG tablet Take 1 tablet (500 mg total) by mouth 2 (two) times daily with a meal. Patient not taking: Reported on 07/16/2021 06/10/21 06/10/22  Silverio Decamp, MD  triamcinolone cream (KENALOG) 0.1 % Apply 1 application topically daily as needed. Patient not taking: Reported on 07/16/2021 05/06/21   Samuel Bouche, NP  triamcinolone cream (KENALOG) 0.5 % Apply 1 application topically 2 (two) times daily. To affected areas. Patient not taking: Reported on 07/16/2021 05/06/21   Samuel Bouche, NP    Family History Family History  Problem Relation Age of Onset   Bipolar disorder Mother    Heart attack Mother        MI in her late 32s   Skin cancer Mother    Bipolar disorder Sister    Dementia Brother    Cancer Cousin        breast   Hypertension Brother    Cancer Sister         breast,brain,cervical ovarian   Cancer Maternal Aunt        breast   Breast cancer Maternal Grandmother    Anemia Neg Hx    Arrhythmia Neg Hx    Asthma Neg Hx    Clotting disorder Neg Hx    Fainting Neg Hx    Heart disease Neg Hx    Heart failure Neg Hx    Hyperlipidemia Neg Hx     Social History Social History   Tobacco Use   Smoking status: Never   Smokeless tobacco: Never  Vaping Use   Vaping Use: Never used  Substance Use Topics   Alcohol use: Not Currently    Alcohol/week: 0.0 standard drinks    Comment: Occasional wine   Drug use: No     Allergies   Demerol [meperidine], Latex, Nickel, Ciprofloxacin, Citrus, Codeine, Erythromycin, Lidocaine, Meperidine hcl, Peanut oil, Peanut-containing drug products, and Soy allergy   Review of Systems Review of Systems  HENT:  Positive for postnasal drip.   Respiratory:  Positive for cough.   All other systems reviewed and are negative.   Physical Exam Triage Vital Signs ED Triage Vitals  Enc Vitals Group     BP 07/16/21 1242 119/79     Pulse Rate 07/16/21 1242 66     Resp 07/16/21 1242 15     Temp 07/16/21 1242 98.8 F (37.1 C)     Temp Source 07/16/21 1242 Oral     SpO2 07/16/21 1242 100 %     Weight 07/16/21 1246 137 lb (62.1 kg)     Height 07/16/21 1246 5' 3.5" (1.613 m)     Head Circumference --      Peak Flow --      Pain Score 07/16/21 1246 0     Pain Loc --      Pain Edu? --      Excl. in [place]? --    No data found.  Updated Vital Signs BP 119/79 (BP Location: Left Arm)   Pulse 66   Temp 98.8 F (37.1 C) (Oral)   Resp 15   Ht 5' 3.5" (1.613 m)   Wt 137 lb (62.1 kg)   SpO2 100%   BMI 23.89 kg/m       Physical Exam Vitals and nursing note reviewed.  Constitutional:      General: She is not in acute distress.    Appearance: Normal  appearance. She is normal weight. She is not ill-appearing.  HENT:     Head: Normocephalic and atraumatic.     Right Ear: Tympanic membrane, ear canal and  external ear normal.     Left Ear: Tympanic membrane, ear canal and external ear normal.     Mouth/Throat:     Mouth: Mucous membranes are moist.     Pharynx: Oropharynx is clear.  Eyes:     Extraocular Movements: Extraocular movements intact.     Conjunctiva/sclera: Conjunctivae normal.     Pupils: Pupils are equal, round, and reactive to light.  Cardiovascular:     Rate and Rhythm: Normal rate and regular rhythm.     Pulses: Normal pulses.     Heart sounds: Normal heart sounds.  Pulmonary:     Effort: Pulmonary effort is normal.     Breath sounds: Normal breath sounds. No wheezing, rhonchi or rales.     Comments: No adventitious breath sounds noted Musculoskeletal:        General: Normal range of motion.     Cervical back: Normal range of motion and neck supple.  Skin:    General: Skin is warm and dry.  Neurological:     General: No focal deficit present.     Mental Status: She is alert and oriented to person, place, and time. Mental status is at baseline.  Psychiatric:        Mood and Affect: Mood normal.        Behavior: Behavior normal.     UC Treatments / Results  Labs (all labs ordered are listed, but only abnormal results are displayed) Labs Reviewed - No data to display  EKG   Radiology No results found.  Procedures Procedures (including critical care time)  Medications Ordered in UC Medications - No data to display  Initial Impression / Assessment and Plan / UC Course  I have reviewed the triage vital signs and the nursing notes.  Pertinent labs & imaging results that were available during my care of the patient were reviewed by me and considered in my medical decision making (see chart for details).     MDM: 1.  Allergic rhinitis-Rx'd Allegra; 2.  Cough-secondary to allergic rhinitis. Advised patient to take Allegra daily for the next 5 to 7 days for concurrent postnasal drainage/drip.  Advised patient may use as needed afterwards.  Encourage patient  increase daily water intake while taking these medications.  Discharged home, hemodynamically stable. Final Clinical Impressions(s) / UC Diagnoses   Final diagnoses:  Allergic rhinitis, unspecified seasonality, unspecified trigger  Cough, unspecified type     Discharge Instructions      Advised patient to take Allegra daily for the next 5 to 7 days for concurrent postnasal drainage/drip.  Advised patient may use as needed afterwards.  Encourage patient increase daily water intake while taking these medications.     ED Prescriptions     Medication Sig Dispense Auth. Provider   fexofenadine (ALLEGRA ALLERGY) 180 MG tablet Take 1 tablet (180 mg total) by mouth daily for 15 days. 15 tablet Eliezer Lofts, FNP   benzonatate (TESSALON) 200 MG capsule Take 1 capsule (200 mg total) by mouth 3 (three) times daily as needed for up to 7 days for cough. 30 capsule Eliezer Lofts, FNP      PDMP not reviewed this encounter.   Eliezer Lofts, [place] 07/16/21 1340

## 2021-07-16 NOTE — Discharge Instructions (Addendum)
Advised patient to take Allegra daily for the next 5 to 7 days for concurrent postnasal drainage/drip.  Advised patient may use as needed afterwards.  Encourage patient increase daily water intake while taking these medications.

## 2021-07-18 ENCOUNTER — Ambulatory Visit (INDEPENDENT_AMBULATORY_CARE_PROVIDER_SITE_OTHER): Payer: Medicare Other | Admitting: Family Medicine

## 2021-07-18 ENCOUNTER — Other Ambulatory Visit: Payer: Self-pay

## 2021-07-18 ENCOUNTER — Encounter: Payer: Self-pay | Admitting: Family Medicine

## 2021-07-18 VITALS — BP 135/77 | HR 66 | Temp 98.5°F

## 2021-07-18 DIAGNOSIS — J329 Chronic sinusitis, unspecified: Secondary | ICD-10-CM

## 2021-07-18 DIAGNOSIS — R059 Cough, unspecified: Secondary | ICD-10-CM | POA: Diagnosis not present

## 2021-07-18 DIAGNOSIS — M25512 Pain in left shoulder: Secondary | ICD-10-CM

## 2021-07-18 DIAGNOSIS — R509 Fever, unspecified: Secondary | ICD-10-CM

## 2021-07-18 DIAGNOSIS — J40 Bronchitis, not specified as acute or chronic: Secondary | ICD-10-CM | POA: Diagnosis not present

## 2021-07-18 LAB — POCT INFLUENZA A/B
Influenza A, POC: NEGATIVE
Influenza B, POC: NEGATIVE

## 2021-07-18 MED ORDER — CEFDINIR 300 MG PO CAPS: 300.0000 mg | ORAL_CAPSULE | Freq: Two times a day (BID) | ORAL | 0 refills | Status: DC

## 2021-07-18 NOTE — Progress Notes (Addendum)
Acute Office Visit  Subjective:    Patient ID: Autumn Garcia, female    DOB: 1961-09-30, 60 y.o.   MRN: [id number]  No chief complaint on file.   HPI Patient is in today for arm pain/injury.  Her left shoulder has been bothering her for 4 months. Injury when she was reaching back over a seat and felt a pinch in her bicep area.   She saw our sports medicine provider a little over a month ago and he felt like it was most consistent with impingement and recommended formal physical therapy.  She has been trying to go to PT but feels like its causing more pain and discomfort.  She is at the point where she would like to have it assessed by orthopedic.` Physical therapy is what she needs she is okay with that but she just wants to make sure she is doing the right thing.  She also says that starting on Friday she started get symptoms of sinus congestion and cough.  She says the cough sputum is green color but with coming out of her nose is more clear.  Her husband has been sick for over a week.  He did run a fever starting yesterday. 100.  He also went to urgent care over the weekend and they felt like her symptoms were more consistent with allergies that she was also having some postnasal drip. Taking Benadryl at night and Sudafed in AM.    Past Medical History:  Diagnosis Date   Anxiety    Chronic kidney disease    Depression    Eating disorder    Epilepsy ([place])    HSV infection    Kidney stones    Murmur    PTSD (post-traumatic stress disorder)    Skin cancer of forehead    If could be a more serious cancer and will not know until after surgery   TB (tuberculosis)    Tested positive and treated    Past Surgical History:  Procedure Laterality Date   CERVICAL POLYPECTOMY     [place]  2010.     Dr. Angeline Slim  at [facility] for large stone   left knee surgery     Removal of basil cell carcinoma      Family History  Problem  Relation Age of Onset   Bipolar disorder Mother    Heart attack Mother        MI in her late 49s   Skin cancer Mother    Bipolar disorder Sister    Dementia Brother    Cancer Cousin        breast   Hypertension Brother    Cancer Sister        breast,brain,cervical ovarian   Cancer Maternal Aunt        breast   Breast cancer Maternal Grandmother    Anemia Neg Hx    Arrhythmia Neg Hx    Asthma Neg Hx    Clotting disorder Neg Hx    Fainting Neg Hx    Heart disease Neg Hx    Heart failure Neg Hx    Hyperlipidemia Neg Hx     Social History   Socioeconomic History   Marital status: Married    Spouse name: [name]   Number of children: 0   Years of education: 14   Highest education level: Some college, no degree  Occupational History   Occupation: disability  Employer: UNEMPLOYED  Tobacco Use   Smoking status: Never   Smokeless tobacco: Never  Vaping Use   Vaping Use: Never used  Substance and Sexual Activity   Alcohol use: Not Currently    Alcohol/week: 0.0 standard drinks    Comment: Occasional wine   Drug use: No   Sexual activity: Yes    Partners: Male    Birth control/protection: Post-menopausal  Other Topics Concern   Not on file  Social History Narrative   Lives with her husband. Enjoys gardening and painting.    Social Determinants of Health   Financial Resource Strain: Low Risk    Difficulty of Paying Living Expenses: Not hard at all  Food Insecurity: No Food Insecurity   Worried About Charity fundraiser in the Last Year: Never true   [place] in the Last Year: Never true  Transportation Needs: No Transportation Needs   Lack of Transportation (Medical): No   Lack of Transportation (Non-Medical): No  Physical Activity: Sufficiently Active   Days of Exercise per Week: 7 days   Minutes of Exercise per Session: 30 min  Stress: No Stress Concern Present   Feeling of Stress : Not at all  Social Connections: Socially Isolated   Frequency of  Communication with Friends and Family: Never   Frequency of Social Gatherings with Friends and Family: Never   Attends Religious Services: Never   Marine scientist or Organizations: No   Attends Music therapist: Never   Marital Status: Married  Human resources officer Violence: Not At Risk   Fear of Current or Ex-Partner: No   Emotionally Abused: No   Physically Abused: No   Sexually Abused: No    Outpatient Medications Prior to Visit  Medication Sig Dispense Refill   benzonatate (TESSALON) 200 MG capsule Take 1 capsule (200 mg total) by mouth 3 (three) times daily as needed for up to 7 days for cough. 30 capsule 0   diazepam (VALIUM) 2 MG tablet Take 1 tablet (2 mg total) by mouth every 8 (eight) hours as needed for anxiety. 90 tablet 0   EPINEPHrine 0.3 mg/0.3 mL IJ SOAJ injection USE AS DIRECTED AND AS NEEDED FOR SEVERE ALLERGIC REACTION 2 each prn   estradiol (ESTRACE) 0.1 MG/GM vaginal cream Place vaginally. (Patient not taking: Reported on 07/16/2021)     fexofenadine (ALLEGRA ALLERGY) 180 MG tablet Take 1 tablet (180 mg total) by mouth daily for 15 days. 15 tablet 0   L-Lysine 500 MG TABS Take 500 mg by mouth. 2 times a week     naproxen (NAPROSYN) 500 MG tablet Take 1 tablet (500 mg total) by mouth 2 (two) times daily with a meal. (Patient not taking: Reported on 07/16/2021) 60 tablet 3   triamcinolone cream (KENALOG) 0.1 % Apply 1 application topically daily as needed. (Patient not taking: Reported on 07/16/2021) 30 g 1   triamcinolone cream (KENALOG) 0.5 % Apply 1 application topically 2 (two) times daily. To affected areas. (Patient not taking: Reported on 07/16/2021) 80 g 0   No facility-administered medications prior to visit.    Allergies  Allergen Reactions   Demerol [Meperidine] Nausea And Vomiting, Nausea Only and Other (See Comments)    seizures   Latex Rash   Nickel     infections   Ciprofloxacin     REACTION: sucidal   Citrus     Geographic  tongue   Codeine     REACTION: Halluncination   Erythromycin  REACTION: Vomiting   Lidocaine Other (See Comments)    Pt unsure if allergic. Last reaction at the dentist and had a seizure, but could have been injected into a vein   Meperidine Hcl     REACTION: Muscle weakness, vomiting   Peanut Oil     Other reaction(s): Other (See Comments) ORAL ULCERS   Peanut-Containing Drug Products Nausea Only   Soy Allergy     Other reaction(s): Other (See Comments) MOUTH ULCER    Review of Systems     Objective:    Physical Exam Constitutional:      Appearance: She is well-developed.  HENT:     Head: Normocephalic and atraumatic.     Right Ear: External ear normal.     Left Ear: External ear normal.     Nose: Nose normal.  Eyes:     Conjunctiva/sclera: Conjunctivae normal.     Pupils: Pupils are equal, round, and reactive to light.  Neck:     Thyroid: No thyromegaly.  Cardiovascular:     Rate and Rhythm: Normal rate and regular rhythm.     Heart sounds: Normal heart sounds.  Pulmonary:     Effort: Pulmonary effort is normal.     Breath sounds: Normal breath sounds. No wheezing.  Musculoskeletal:     Cervical back: Neck supple.  Lymphadenopathy:     Cervical: No cervical adenopathy.  Skin:    General: Skin is warm and dry.  Neurological:     Mental Status: She is alert and oriented to person, place, and time.    BP 135/77   Pulse 66   Temp 98.5 F (36.9 C)   SpO2 100%  Wt Readings from Last 3 Encounters:  07/16/21 137 lb (62.1 kg)  11/15/20 165 lb (74.8 kg)  07/26/20 160 lb (72.6 kg)    Health Maintenance Due  Topic Date Due   Zoster Vaccines- Shingrix (1 of 2) Never done   Fecal DNA (Cologuard)  Never done   COVID-19 Vaccine (3 - Mixed Product risk series) 02/26/2020   INFLUENZA VACCINE  Never done    There are no preventive care reminders to display for this patient.   Lab Results  Component Value Date   TSH 1.576 05/17/2015   Lab Results   Component Value Date   WBC 6.0 05/17/2015   HGB 14.0 05/17/2015   HCT 42.5 05/17/2015   MCV 90.4 05/17/2015   PLT 260 05/17/2015   Lab Results  Component Value Date   NA 140 02/22/2021   K 4.6 02/22/2021   CO2 28 02/22/2021   GLUCOSE 106 (H) 02/22/2021   BUN 11 02/22/2021   CREATININE 0.83 02/22/2021   BILITOT 0.4 02/22/2021   ALKPHOS 89 05/17/2015   AST 31 02/22/2021   ALT 32 (H) 06/03/2021   PROT 7.2 02/22/2021   ALBUMIN 4.5 05/17/2015   CALCIUM 9.8 02/22/2021   Lab Results  Component Value Date   CHOL 236 (H) 02/22/2021   Lab Results  Component Value Date   HDL 65 02/22/2021   Lab Results  Component Value Date   LDLCALC 155 (H) 02/22/2021   Lab Results  Component Value Date   TRIG 66 02/22/2021   Lab Results  Component Value Date   CHOLHDL 3.6 02/22/2021   Lab Results  Component Value Date   HGBA1C 5.8 02/22/2021       Assessment & Plan:   Problem List Items Addressed This Visit   None Visit Diagnoses  Acute pain of left shoulder    -  Primary   Relevant Orders   Ambulatory referral to Orthopedic Surgery   Cough, unspecified type       Relevant Orders   POCT Influenza A/B (Completed)   Fever, unspecified fever cause       Relevant Orders   POCT Influenza A/B (Completed)   Sinobronchitis       Relevant Medications   cefdinir (OMNICEF) 300 MG capsule      Sinobronchitis-likely viral.  That her husband was sick for a week and a half and we did end up treating him with antibiotics.  But he also had a fever after a week of symptoms where she is had a fever after 2 days of symptoms.  We did do a flu swab and it was negative unless suspicious for COVID as he also tested negative for COVID last week.  We discussed treating with antibiotics only if she still having per symptoms symptoms after several days.  Otherwise recommend symptomatic care.   Left shoulder pain-we will go ahead and place referral to the orthopedist she would [facility] [place].  Meds ordered this encounter  Medications   cefdinir (OMNICEF) 300 MG capsule    Sig: Take 1 capsule (300 mg total) by mouth 2 (two) times daily.    Dispense:  10 capsule    Refill:  0      Beatrice Lecher, MD

## 2021-07-22 ENCOUNTER — Ambulatory Visit: Payer: Medicare Other | Admitting: Sports Medicine

## 2021-07-25 ENCOUNTER — Ambulatory Visit: Payer: Medicare Other

## 2021-08-20 ENCOUNTER — Encounter (HOSPITAL_BASED_OUTPATIENT_CLINIC_OR_DEPARTMENT_OTHER): Payer: Self-pay

## 2021-08-20 ENCOUNTER — Emergency Department (HOSPITAL_BASED_OUTPATIENT_CLINIC_OR_DEPARTMENT_OTHER)
Admission: EM | Admit: 2021-08-20 | Discharge: 2021-08-20 | Disposition: A | Discharge status: Home / Self Care | Payer: Medicare Other | Attending: Emergency Medicine | Admitting: Emergency Medicine

## 2021-08-20 ENCOUNTER — Other Ambulatory Visit: Payer: Self-pay

## 2021-08-20 DIAGNOSIS — Z91010 Allergy to peanuts: Secondary | ICD-10-CM | POA: Diagnosis not present

## 2021-08-20 DIAGNOSIS — N3001 Acute cystitis with hematuria: Secondary | ICD-10-CM

## 2021-08-20 DIAGNOSIS — N189 Chronic kidney disease, unspecified: Secondary | ICD-10-CM | POA: Insufficient documentation

## 2021-08-20 DIAGNOSIS — Z85828 Personal history of other malignant neoplasm of skin: Secondary | ICD-10-CM | POA: Diagnosis not present

## 2021-08-20 DIAGNOSIS — R350 Frequency of micturition: Secondary | ICD-10-CM | POA: Diagnosis not present

## 2021-08-20 DIAGNOSIS — R300 Dysuria: Secondary | ICD-10-CM | POA: Diagnosis not present

## 2021-08-20 DIAGNOSIS — Z91040 Latex allergy status: Secondary | ICD-10-CM | POA: Insufficient documentation

## 2021-08-20 LAB — URINALYSIS, ROUTINE W REFLEX MICROSCOPIC
Bilirubin Urine: NEGATIVE
Glucose, UA: NEGATIVE mg/dL
Ketones, ur: NEGATIVE mg/dL
Nitrite: NEGATIVE
Protein, ur: NEGATIVE mg/dL
Specific Gravity, Urine: 1.01 (ref 1.005–1.030)
pH: 7 (ref 5.0–8.0)

## 2021-08-20 LAB — URINALYSIS, MICROSCOPIC (REFLEX)

## 2021-08-20 MED ORDER — SULFAMETHOXAZOLE-TRIMETHOPRIM 800-160 MG PO TABS: 1.0000 | ORAL_TABLET | Freq: Two times a day (BID) | ORAL | 0 refills | Status: AC

## 2021-08-20 NOTE — ED Notes (Signed)
First contact with patient.  Pt. denies shob, is A&OX 4, resp. even/unlabored.  Patient updated on plan of care.

## 2021-08-20 NOTE — ED Triage Notes (Signed)
Pt presents to ED from home C/O urinary frequency, dysuria, constant burning X 1 day.

## 2021-08-20 NOTE — ED Notes (Signed)
Pt discharged to home. Discharge instructions have been discussed with patient and/or family members. Pt verbally acknowledges understanding d/c instructions, and endorses comprehension to checkout at registration before leaving.  °

## 2021-08-20 NOTE — ED Provider Notes (Signed)
[facility] EMERGENCY DEPARTMENT Provider Note   CSN: [id number] Arrival date & time: 08/20/21  3267     History Chief Complaint  Patient presents with   Dysuria    Autumn Garcia is a 60 y.o. female.  The history is provided by the patient and medical records.  Dysuria Pain quality:  Aching and burning Pain severity:  No pain Onset quality:  Sudden Duration:  1 day Timing:  Constant Progression:  Unchanged Chronicity:  Recurrent Recent urinary tract infections: yes (april 2022)   Relieved by:  Nothing Worsened by:  Nothing Ineffective treatments:  None tried Urinary symptoms: frequent urination   Urinary symptoms comment:  Urgency Associated symptoms: no abdominal pain, no fever, no flank pain, no nausea, no vaginal discharge and no vomiting   Risk factors: single kidney   Risk factors: no hx of pyelonephritis, no kidney transplant and no sexually transmitted infections       Past Medical History:  Diagnosis Date   Anxiety    Chronic kidney disease    Depression    Eating disorder    Epilepsy ([place])    HSV infection    Kidney stones    Murmur    PTSD (post-traumatic stress disorder)    Skin cancer of forehead    If could be a more serious cancer and will not know until after surgery   TB (tuberculosis)    Tested positive and treated    Patient Active Problem List   Diagnosis Date Noted   Impingement syndrome, shoulder, left 06/10/2021   Metatarsalgia 06/10/2021   Orthostatic hypotension 01/12/2021   Elevated blood pressure reading 01/12/2021   Recurrent UTI 11/30/2020   IFG (impaired fasting glucose) 11/30/2020   Bee allergy status 09/27/2018   Hyperlipidemia 04/26/2015   Herpes simplex type II infection 04/26/2014   Vertigo 11/13/2013   Vitamin D deficiency 10/29/2013   Basal cell carcinoma of skin 10/29/2013   Ventral hernia without obstruction or gangrene 01/27/2013   Dissociative disorder 06/03/2012   PTSD (post-traumatic  stress disorder) 11/15/2011    Class: Chronic   Dissociative identity disorder ([place]) 10/06/2011    Class: Chronic   Borderline personality disorder ([place]) 10/03/2011   HOT FLASHES 09/03/2009   ANXIETY DISORDER, GENERALIZED 06/14/2009   KNEE PAIN 01/26/2009   Other convulsions 01/26/2009    Past Surgical History:  Procedure Laterality Date   CERVICAL POLYPECTOMY     CHOLECYSTECTOMY     KIDNEY SURGERY     LAPAROSCOPIC NEPHRECTOMY  2010.     Dr. Angeline Slim  at [facility] for large stone   left knee surgery     Removal of basil cell carcinoma       OB History     Gravida  3   Para  0   Term      Preterm      AB  3   Living  0      SAB  1   IAB  2   Ectopic      Multiple      Live Births              Family History  Problem Relation Age of Onset   Bipolar disorder Mother    Heart attack Mother        MI in her late 42s   Skin cancer Mother    Bipolar disorder Sister    Dementia Brother    Cancer Cousin  breast   Hypertension Brother    Cancer Sister        breast,brain,cervical ovarian   Cancer Maternal Aunt        breast   Breast cancer Maternal Grandmother    Anemia Neg Hx    Arrhythmia Neg Hx    Asthma Neg Hx    Clotting disorder Neg Hx    Fainting Neg Hx    Heart disease Neg Hx    Heart failure Neg Hx    Hyperlipidemia Neg Hx     Social History   Tobacco Use   Smoking status: Never   Smokeless tobacco: Never  Vaping Use   Vaping Use: Never used  Substance Use Topics   Alcohol use: Not Currently    Alcohol/week: 0.0 standard drinks    Comment: Occasional wine   Drug use: No    Home Medications Prior to Admission medications   Medication Sig Start Date End Date Taking? Authorizing Provider  cefdinir (OMNICEF) 300 MG capsule Take 1 capsule (300 mg total) by mouth 2 (two) times daily. 07/18/21   Hali Marry, MD  diazepam (VALIUM) 2 MG tablet Take 1 tablet (2 mg total) by mouth every 8 (eight) hours as needed for anxiety.  03/03/20   Hali Marry, MD  EPINEPHrine 0.3 mg/0.3 mL IJ SOAJ injection USE AS DIRECTED AND AS NEEDED FOR SEVERE ALLERGIC REACTION 03/02/20   Hali Marry, MD  estradiol (ESTRACE) 0.1 MG/GM vaginal cream Place vaginally. Patient not taking: Reported on 07/16/2021 11/03/20 11/03/21  [provider]  fexofenadine (ALLEGRA ALLERGY) 180 MG tablet Take 1 tablet (180 mg total) by mouth daily for 15 days. 07/16/21 07/31/21  Eliezer Lofts, FNP  L-Lysine 500 MG TABS Take 500 mg by mouth. 2 times a week    [provider]  naproxen (NAPROSYN) 500 MG tablet Take 1 tablet (500 mg total) by mouth 2 (two) times daily with a meal. Patient not taking: Reported on 07/16/2021 06/10/21 06/10/22  Silverio Decamp, MD  triamcinolone cream (KENALOG) 0.1 % Apply 1 application topically daily as needed. Patient not taking: Reported on 07/16/2021 05/06/21   Samuel Bouche, NP  triamcinolone cream (KENALOG) 0.5 % Apply 1 application topically 2 (two) times daily. To affected areas. Patient not taking: Reported on 07/16/2021 05/06/21   Samuel Bouche, NP    Allergies    Demerol [meperidine], Latex, Nickel, Ciprofloxacin, Citrus, Codeine, Erythromycin, Lidocaine, Meperidine hcl, Peanut oil, Peanut-containing drug products, and Soy allergy  Review of Systems   Review of Systems  Constitutional:  Negative for fever.  Gastrointestinal:  Negative for abdominal pain, nausea and vomiting.  Genitourinary:  Positive for dysuria. Negative for flank pain and vaginal discharge.   Physical Exam Updated Vital Signs BP 136/86 (BP Location: Right Arm)   Pulse (!) 58   Temp 98.2 F (36.8 C) (Oral)   Resp 16   Ht 5\' 4"  (1.626 m)   Wt 61.2 kg   SpO2 100%   BMI 23.17 kg/m   Physical Exam Physical Exam  Nursing note and vitals reviewed. Constitutional: She is oriented to person, place, and time. She appears well-developed and well-nourished. No distress.  HENT:  Head: Normocephalic and atraumatic.   Eyes: Conjunctivae normal and EOM are normal. Pupils are equal, round, and reactive to light. No scleral icterus.  Neck: Normal range of motion.  Cardiovascular: Normal rate, regular rhythm and normal heart sounds.  Exam reveals no gallop and no friction rub.   No murmur heard.  Pulmonary/Chest: Effort normal and breath sounds normal. No respiratory distress.  Abdominal: Soft. Bowel sounds are normal. She exhibits no distension and no mass. There is no tenderness. There is no guarding.  No CVA tenderness Neurological: She is alert and oriented to person, place, and time.  Skin: Skin is warm and dry. She is not diaphoretic.    ED Results / Procedures / Treatments   Labs (all labs ordered are listed, but only abnormal results are displayed) Labs Reviewed  URINALYSIS, ROUTINE W REFLEX MICROSCOPIC - Abnormal; Notable for the following components:      Result Value   Hgb urine dipstick SMALL (*)    Leukocytes,Ua SMALL (*)    All other components within normal limits  URINALYSIS, MICROSCOPIC (REFLEX) - Abnormal; Notable for the following components:   Bacteria, UA RARE (*)    All other components within normal limits    EKG None  Radiology No results found.  Procedures Procedures   Medications Ordered in ED Medications - No data to display  ED Course  I have reviewed the triage vital signs and the nursing notes.  Pertinent labs & imaging results that were available during my care of the patient were reviewed by me and considered in my medical decision making (see chart for details).    MDM Rules/Calculators/A&P                          Final Clinical Impression(s) / ED Diagnoses Final diagnoses:  None   61 year old female who presents emergency department with chief complaint of urgency, frequency, dysuria.  She has a history of recurrent urinary tract infections.  She states the last 1 was back in April.  She has a single kidney and is status post nephrectomy.  She denies  any nausea, vomiting, fever, chills or vaginal symptoms.  We will treat with Bactrim, outpatient follow-up with PCP if symptoms are not improving.  She was otherwise appropriate for discharge without evidence of pyelonephritis or other severe infection.  Symptoms began this morning. Rx / DC Orders ED Discharge Orders     None        Margarita Mail, PA-C 08/20/21 1040    Gareth Morgan, MD 08/21/21 [phone]

## 2021-08-20 NOTE — Discharge Instructions (Signed)
Contact a health care provider if: Your symptoms do not get better after 1-2 days. Your symptoms go away and then return. Get help right away if: You have severe pain in your back or your lower abdomen. You have a fever or chills. You have nausea or vomiting. 

## 2021-08-23 ENCOUNTER — Encounter: Payer: Self-pay | Admitting: Family Medicine

## 2021-08-24 ENCOUNTER — Ambulatory Visit (INDEPENDENT_AMBULATORY_CARE_PROVIDER_SITE_OTHER): Payer: Medicare Other | Admitting: Family Medicine

## 2021-08-24 ENCOUNTER — Other Ambulatory Visit: Payer: Self-pay

## 2021-08-24 VITALS — BP 143/72 | HR 64

## 2021-08-24 DIAGNOSIS — N390 Urinary tract infection, site not specified: Secondary | ICD-10-CM | POA: Diagnosis not present

## 2021-08-24 LAB — POCT URINALYSIS DIPSTICK
Bilirubin, UA: NEGATIVE
Blood, UA: NEGATIVE
Glucose, UA: NEGATIVE
Ketones, UA: NEGATIVE
Leukocytes, UA: NEGATIVE
Nitrite, UA: NEGATIVE
Protein, UA: NEGATIVE
Spec Grav, UA: <=1.005 — AB (ref 1.010–1.025)
Urobilinogen, UA: 0.2 E.U./dL
pH, UA: 6 (ref 5.0–8.0)

## 2021-08-24 NOTE — Progress Notes (Signed)
Pt c/o dysuria, nausea, urinary frequency x 2 days.  Pt denies any fevers, vomiting or urinary hesitation.  Charyl Bigger, CMA

## 2021-08-24 NOTE — Progress Notes (Addendum)
Please send urine for culture.  Patient has come in frequently recently with urinary tract symptoms.  So will await culture results before treating.

## 2021-08-27 LAB — CULTURE, URINE COMPREHENSIVE
MICRO NUMBER:: 12677761
SPECIMEN QUALITY:: ADEQUATE

## 2021-08-28 ENCOUNTER — Other Ambulatory Visit: Payer: Self-pay | Admitting: Medical-Surgical

## 2021-08-28 MED ORDER — NITROFURANTOIN MONOHYD MACRO 100 MG PO CAPS: 100.0000 mg | ORAL_CAPSULE | Freq: Two times a day (BID) | ORAL | 0 refills | Status: DC

## 2021-08-29 ENCOUNTER — Encounter: Payer: Self-pay | Admitting: Family Medicine

## 2021-08-30 DIAGNOSIS — R399 Unspecified symptoms and signs involving the genitourinary system: Secondary | ICD-10-CM | POA: Diagnosis not present

## 2021-08-30 NOTE — Telephone Encounter (Signed)
Please have her take the nitrofurantoin that was sent in yesterday.

## 2021-09-07 DIAGNOSIS — L259 Unspecified contact dermatitis, unspecified cause: Secondary | ICD-10-CM | POA: Diagnosis not present

## 2021-09-07 DIAGNOSIS — L299 Pruritus, unspecified: Secondary | ICD-10-CM | POA: Diagnosis not present

## 2021-09-07 DIAGNOSIS — R21 Rash and other nonspecific skin eruption: Secondary | ICD-10-CM | POA: Diagnosis not present

## 2021-09-12 ENCOUNTER — Encounter: Payer: Self-pay | Admitting: Family Medicine

## 2021-09-13 DIAGNOSIS — I872 Venous insufficiency (chronic) (peripheral): Secondary | ICD-10-CM | POA: Diagnosis not present

## 2021-09-13 DIAGNOSIS — M7712 Lateral epicondylitis, left elbow: Secondary | ICD-10-CM | POA: Diagnosis not present

## 2021-09-13 DIAGNOSIS — M7582 Other shoulder lesions, left shoulder: Secondary | ICD-10-CM | POA: Diagnosis not present

## 2021-09-13 DIAGNOSIS — M25522 Pain in left elbow: Secondary | ICD-10-CM | POA: Diagnosis not present

## 2021-09-22 ENCOUNTER — Other Ambulatory Visit: Payer: Self-pay | Admitting: Family Medicine

## 2021-09-22 ENCOUNTER — Encounter: Payer: Self-pay | Admitting: Family Medicine

## 2021-09-22 ENCOUNTER — Telehealth (INDEPENDENT_AMBULATORY_CARE_PROVIDER_SITE_OTHER): Payer: Medicare Other | Admitting: Family Medicine

## 2021-09-22 DIAGNOSIS — R21 Rash and other nonspecific skin eruption: Secondary | ICD-10-CM

## 2021-09-22 DIAGNOSIS — R200 Anesthesia of skin: Secondary | ICD-10-CM

## 2021-09-22 MED ORDER — MUPIROCIN 2 % EX OINT: TOPICAL_OINTMENT | CUTANEOUS | 0 refills | Status: DC

## 2021-09-22 NOTE — Progress Notes (Signed)
Pt reports that she has been to 5 different doctors about what has been going on with her legs. She informed me that she has been using Mupirocin on her R leg and this has been working for her. On the L leg she has just been cleaning it but it continues to itch and bother her.  She feels that she either has a fungus or folliculitis.      She had been told that it may be "neuromuscular"  She stated that she has to go to a bunch of doctor's to get the correct diagnosis. I tend to ask a lot of questions and do a lot of research   She stated that she has lost a lot of weight and has been in treatment and really feels better about her body now.

## 2021-09-22 NOTE — Progress Notes (Signed)
Virtual Visit via Video Note  I connected with Autumn Garcia on 09/27/21 at  1:00 PM EST by a video enabled telemedicine application and verified that I am speaking with the correct person using two identifiers.   I discussed the limitations of evaluation and management by telemedicine and the availability of in person appointments. The patient expressed understanding and agreed to proceed.  Patient location: at home Provider location: in office  Subjective:    CC:  No chief complaint on file.   HPI: Pt reports that she has been to 5 different doctors about what has been going on with her legs. She informed me that she has been using Mupirocin on her R leg and this has been working for her. On the L leg she has just been cleaning it but it continues to itch and bother her.  She feels that she either has a fungus or folliculitis.   Nystatin cream helps but didn't make it go away.  Steroid helps too but not completely going well.  Has been going on for 2.5 years. Has tried Tea tree oil.  Not shaving makes it worse. No hot tub and not soaking in the tub.     She had been told that it may be "neuromuscular".    She stated that she has to go to a bunch of doctor's to get the correct diagnosis. I tend to ask a lot of questions and do a lot of research    She stated that she has lost a lot of weight and has been in treatment and really feels better about her body now.  Saw Dermatology about 2 weeks ago.   Middle toe on right foot has been numb.  Appt with Neurology in about a month.  Going on for about a month. Position change doesn't make it worse.  No pain . No trauma.   Past medical history, Surgical history, Family history not pertinant except as noted below, Social history, Allergies, and medications have been entered into the medical record, reviewed, and corrections made.    Objective:    General: Speaking clearly in complete sentences without any shortness of  breath.  Alert and oriented x3.  Normal judgment. No apparent acute distress.    Impression and Recommendations:    Problem List Items Addressed This Visit   None Visit Diagnoses     Numbness of toes    -  Primary   Rash and nonspecific skin eruption           Rash - unclear etiology. Has seen multiple providers over last several years.  Has responded  really well to mupirocin for the last 3 days. Recommend complete a full 1 day tx. If sxs recur then return for a punch bx   Numbness of middle toe - has appt with Neurology If workup is normal then consider Podiatry referral to evaluate for a neuroma, etc.   No orders of the defined types were placed in this encounter.   Meds ordered this encounter  Medications   mupirocin ointment (BACTROBAN) 2 %    Sig: Apply to inside of each nares daily for 10 days then twice a week for maintenance.    Dispense:  60 g    Refill:  0     I discussed the assessment and treatment plan with the patient. The patient was provided an opportunity to ask questions and all were answered. The patient agreed with the plan and demonstrated an understanding  of the instructions.   The patient was advised to call back or seek an in-person evaluation if the symptoms worsen or if the condition fails to improve as anticipated.  I spent 21 minutes on the day of the encounter to include pre-visit record review, face-to-face time with the patient and post visit ordering of test.   Beatrice Lecher, MD

## 2021-09-28 ENCOUNTER — Other Ambulatory Visit: Payer: Self-pay | Admitting: Family Medicine

## 2021-10-10 DIAGNOSIS — M79622 Pain in left upper arm: Secondary | ICD-10-CM | POA: Diagnosis not present

## 2021-10-10 DIAGNOSIS — M25522 Pain in left elbow: Secondary | ICD-10-CM | POA: Diagnosis not present

## 2021-10-10 DIAGNOSIS — M25512 Pain in left shoulder: Secondary | ICD-10-CM | POA: Diagnosis not present

## 2021-10-21 DIAGNOSIS — G629 Polyneuropathy, unspecified: Secondary | ICD-10-CM | POA: Diagnosis not present

## 2021-10-22 DIAGNOSIS — M25522 Pain in left elbow: Secondary | ICD-10-CM | POA: Diagnosis not present

## 2021-10-22 DIAGNOSIS — M25512 Pain in left shoulder: Secondary | ICD-10-CM | POA: Diagnosis not present

## 2021-10-31 DIAGNOSIS — D529 Folate deficiency anemia, unspecified: Secondary | ICD-10-CM | POA: Diagnosis not present

## 2021-10-31 DIAGNOSIS — R202 Paresthesia of skin: Secondary | ICD-10-CM | POA: Diagnosis not present

## 2021-10-31 DIAGNOSIS — R5383 Other fatigue: Secondary | ICD-10-CM | POA: Diagnosis not present

## 2021-10-31 DIAGNOSIS — M6281 Muscle weakness (generalized): Secondary | ICD-10-CM | POA: Diagnosis not present

## 2021-10-31 DIAGNOSIS — M2550 Pain in unspecified joint: Secondary | ICD-10-CM | POA: Diagnosis not present

## 2021-10-31 DIAGNOSIS — R42 Dizziness and giddiness: Secondary | ICD-10-CM | POA: Diagnosis not present

## 2021-10-31 DIAGNOSIS — G609 Hereditary and idiopathic neuropathy, unspecified: Secondary | ICD-10-CM | POA: Diagnosis not present

## 2021-10-31 DIAGNOSIS — E531 Pyridoxine deficiency: Secondary | ICD-10-CM | POA: Diagnosis not present

## 2021-10-31 DIAGNOSIS — E538 Deficiency of other specified B group vitamins: Secondary | ICD-10-CM | POA: Diagnosis not present

## 2021-10-31 DIAGNOSIS — G589 Mononeuropathy, unspecified: Secondary | ICD-10-CM | POA: Diagnosis not present

## 2021-10-31 DIAGNOSIS — R262 Difficulty in walking, not elsewhere classified: Secondary | ICD-10-CM | POA: Diagnosis not present

## 2021-10-31 DIAGNOSIS — E559 Vitamin D deficiency, unspecified: Secondary | ICD-10-CM | POA: Diagnosis not present

## 2021-10-31 DIAGNOSIS — M5417 Radiculopathy, lumbosacral region: Secondary | ICD-10-CM | POA: Diagnosis not present

## 2021-10-31 DIAGNOSIS — G5733 Lesion of lateral popliteal nerve, bilateral lower limbs: Secondary | ICD-10-CM | POA: Diagnosis not present

## 2021-11-01 DIAGNOSIS — M25522 Pain in left elbow: Secondary | ICD-10-CM | POA: Diagnosis not present

## 2021-11-01 DIAGNOSIS — M25512 Pain in left shoulder: Secondary | ICD-10-CM | POA: Diagnosis not present

## 2021-11-01 DIAGNOSIS — M545 Low back pain, unspecified: Secondary | ICD-10-CM | POA: Diagnosis not present

## 2021-11-02 ENCOUNTER — Other Ambulatory Visit: Payer: Self-pay | Admitting: Specialist

## 2021-11-02 DIAGNOSIS — M5416 Radiculopathy, lumbar region: Secondary | ICD-10-CM

## 2021-11-08 DIAGNOSIS — N200 Calculus of kidney: Secondary | ICD-10-CM | POA: Diagnosis not present

## 2021-11-08 DIAGNOSIS — N132 Hydronephrosis with renal and ureteral calculous obstruction: Secondary | ICD-10-CM | POA: Diagnosis not present

## 2021-11-12 ENCOUNTER — Other Ambulatory Visit: Payer: Self-pay

## 2021-11-12 ENCOUNTER — Ambulatory Visit (INDEPENDENT_AMBULATORY_CARE_PROVIDER_SITE_OTHER): Payer: Medicare Other

## 2021-11-12 DIAGNOSIS — M5126 Other intervertebral disc displacement, lumbar region: Secondary | ICD-10-CM | POA: Diagnosis not present

## 2021-11-12 DIAGNOSIS — M5416 Radiculopathy, lumbar region: Secondary | ICD-10-CM

## 2021-11-12 DIAGNOSIS — R202 Paresthesia of skin: Secondary | ICD-10-CM

## 2021-11-12 DIAGNOSIS — M4316 Spondylolisthesis, lumbar region: Secondary | ICD-10-CM | POA: Diagnosis not present

## 2021-11-12 DIAGNOSIS — M48061 Spinal stenosis, lumbar region without neurogenic claudication: Secondary | ICD-10-CM | POA: Diagnosis not present

## 2021-11-12 IMAGING — MR MR LUMBAR SPINE W/O CM
4 of 5 series · 25 of 48 positions shown · non-contrast
Comparison: None.

CLINICAL DATA: Initial evaluation for numbness in right foot and
left leg.

EXAM:
MRI LUMBAR SPINE WITHOUT CONTRAST
TECHNIQUE: Multiplanar, multisequence MR imaging of the lumbar spine was
performed. No intravenous contrast was administered.

[Series 2: T2 · sagittal · 4.0mm · 0.81mm/px · 6 of 15 slices shown (1 of 2)]
[im 1/15]
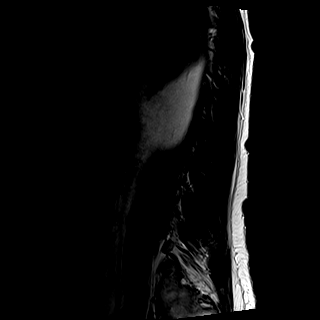
[im 3/15]
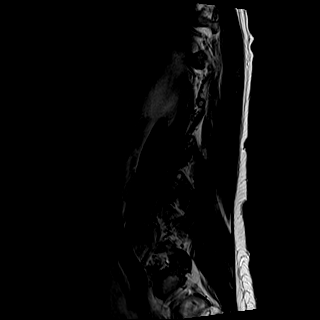
[im 6/15]
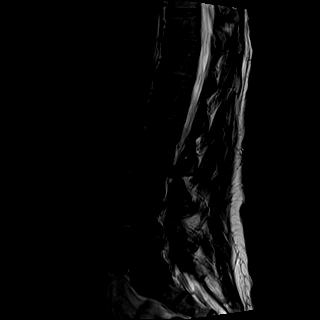
[im 9/15]
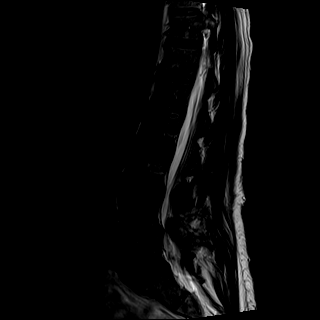
[im 12/15]
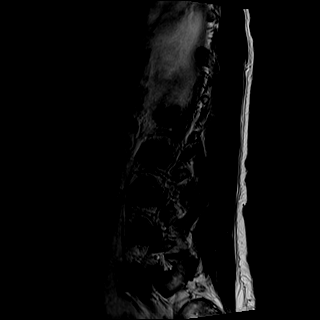
[im 15/15]
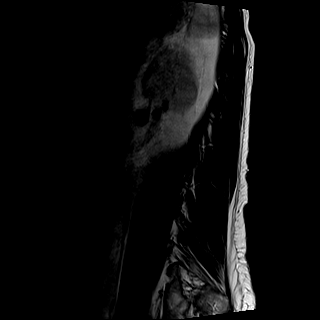

[Series 3: T1 · sagittal · 4.0mm · 0.41mm/px · 6 of 15 slices shown (1 of 2)]
[im 1/15]
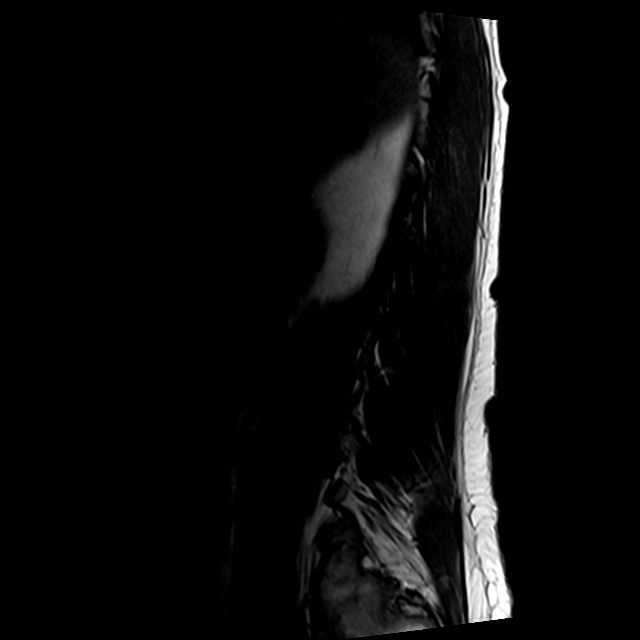
[im 3/15]
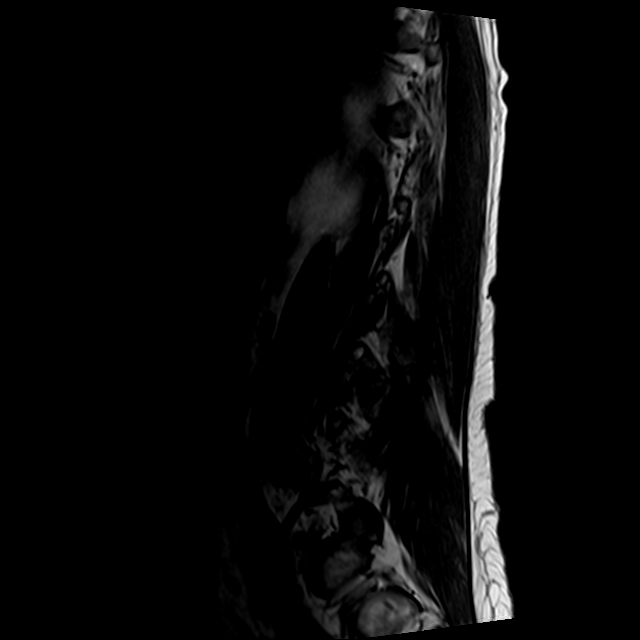
[im 6/15]
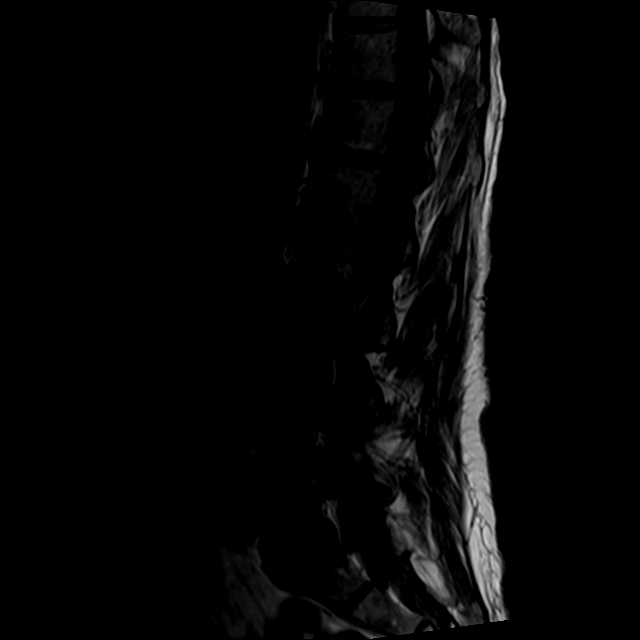
[im 9/15]
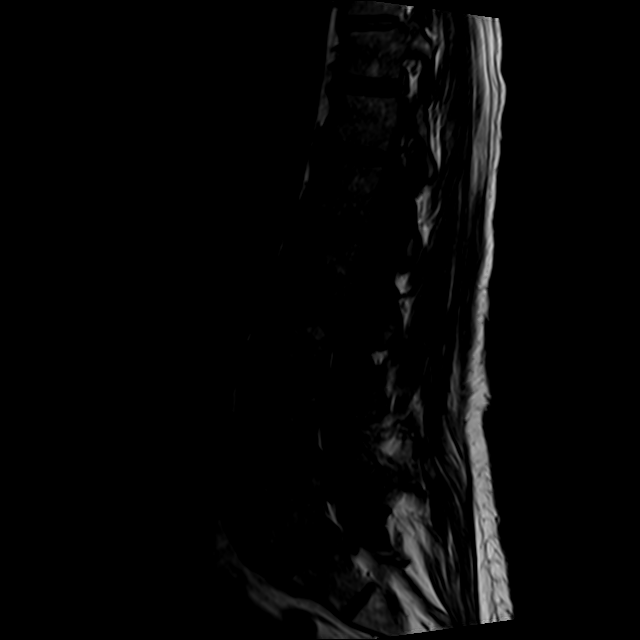
[im 12/15]
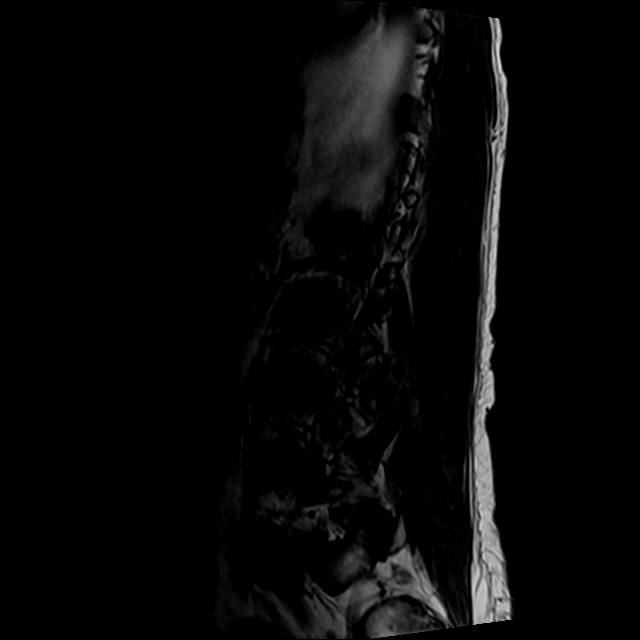
[im 15/15]
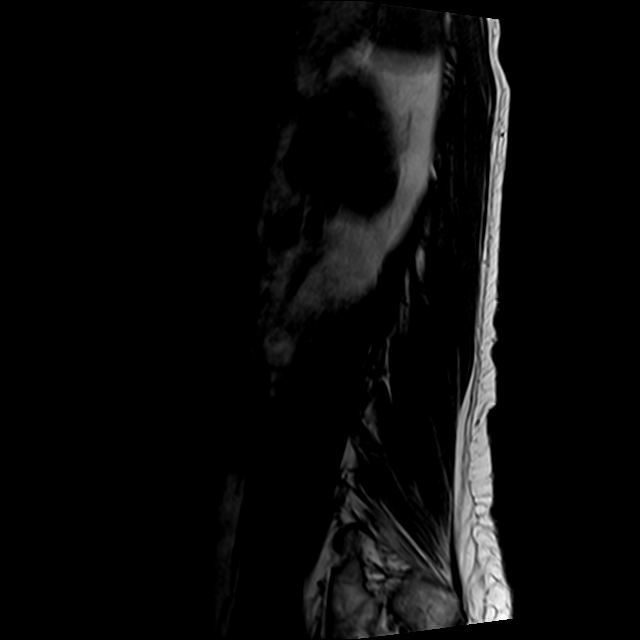

[Series 5: T2 · axial · 4.0mm · 0.78mm/px · z∈[-54,+157]mm · 9 of 38 slices shown (2 of 2)]
[im 1/38]
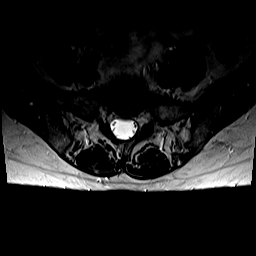
[im 6/38]
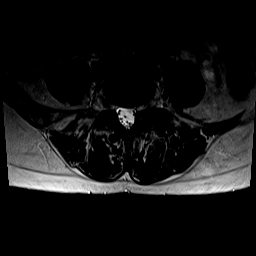
[im 11/38]
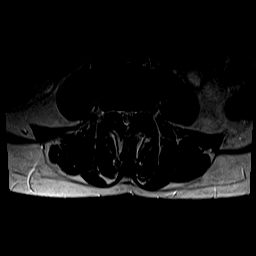
[im 16/38]
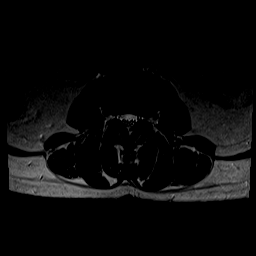
[im 19/38]
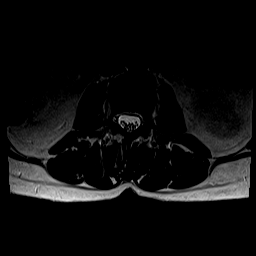
[im 22/38]
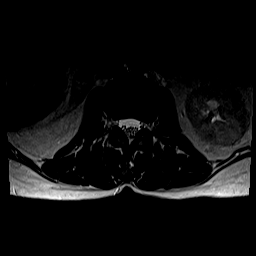
[im 27/38]
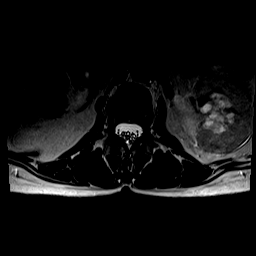
[im 32/38]
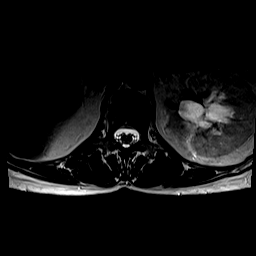
[im 38/38]
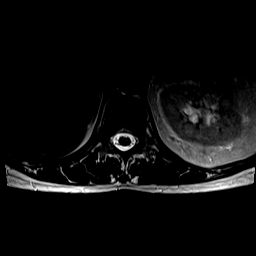

[Series 6: T1 · axial · 4.0mm · 0.39mm/px · z∈[-54,+127]mm · 4 of 38 slices shown (2 of 2)]
[im 1/38]
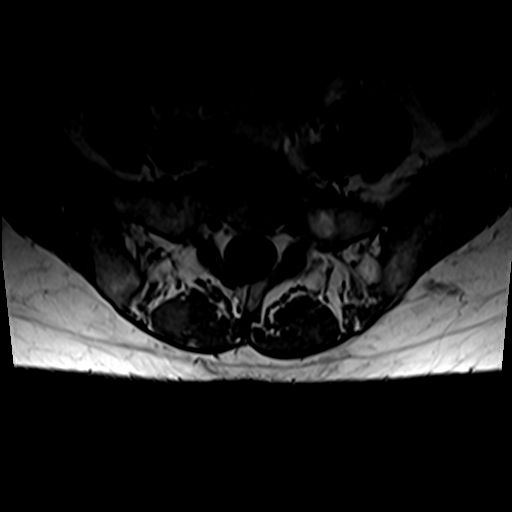
[im 6/38]
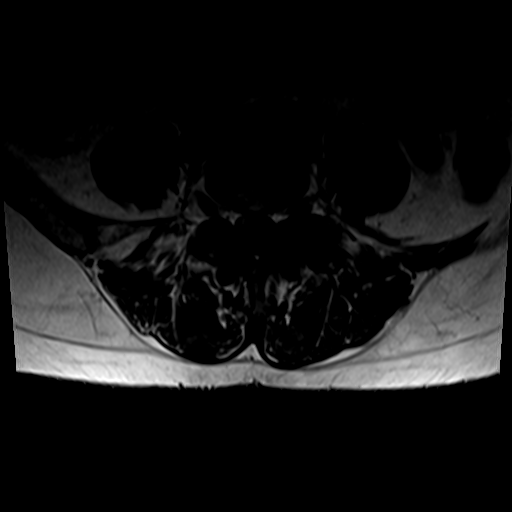
[im 19/38]
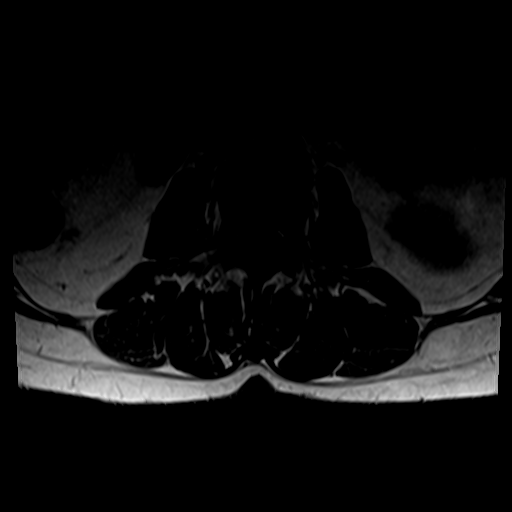
[im 32/38]
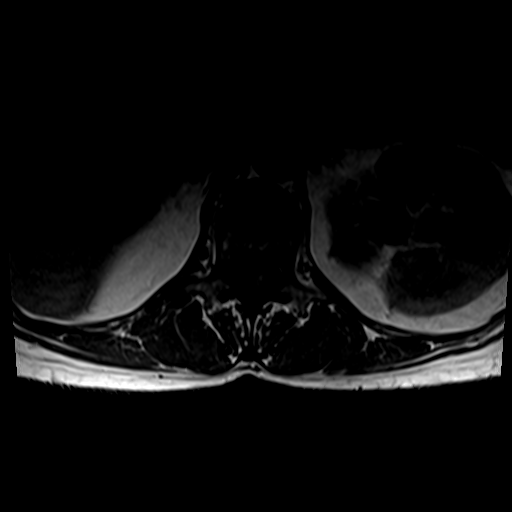

[25 of 48 positions shown; findings below may reference images not displayed]

FINDINGS: Segmentation: Standard. Lowest well-formed disc space labeled the
L5-S1 level.

Alignment: Trace facet mediated anterolisthesis of L4 on L5.
Alignment otherwise normal preservation of the normal lumbar
lordosis.

Vertebrae: Vertebral body height maintained without acute or chronic
fracture. Bone marrow signal intensity somewhat diffusely
heterogeneous but overall within normal limits. Few scattered benign
hemangiomata noted. No worrisome osseous lesions. No abnormal marrow
edema.

Conus medullaris and cauda equina: Conus extends to the L2 level.
Conus and cauda equina appear normal.

Paraspinal and other soft tissues: Paraspinous soft tissues within
normal limits. Right kidney not visualized, and could be absent.
Suspected compensatory hypertrophy of the left kidney with multiple
benign appearing parapelvic cysts.

Disc levels:

L1-2:  Unremarkable.

L2-3:  Unremarkable.

L3-4: Disc desiccation with mild diffuse disc bulge. Superimposed
small right foraminal to extraforaminal disc protrusion contacts the
exiting right L3 nerve root (series 5, image 23). No significant
spinal stenosis. Foramina remain patent.

L4-5: Trace anterolisthesis. Mild disc bulge with disc desiccation.
Small right foraminal disc protrusion contacts the exiting right L4
nerve root (series 5, image 28). Moderate facet hypertrophy.
Resultant moderate canal with bilateral subarticular stenosis. Mild
to moderate bilateral L4 foraminal narrowing.

L5-S1: Negative interspace. Severe left-sided facet arthrosis. No
significant spinal stenosis. Mild left L5 foraminal narrowing. Right
neural foramen remains patent.
IMPRESSION: 1. Small right foraminal disc protrusion at L4-5, contacting and
potentially irritating the exiting right L4 nerve root. Moderate
canal with bilateral subarticular stenosis at this level related to
disc bulge and facet hypertrophy.
2. Additional small right foraminal to extraforaminal disc
protrusion at L3-4, contacting and potentially irritating the
exiting right L3 nerve root.
3. Severe left-sided facet arthrosis at L5-S1 with resultant mild
left L5 foraminal stenosis.

## 2021-11-22 DIAGNOSIS — M79602 Pain in left arm: Secondary | ICD-10-CM | POA: Diagnosis not present

## 2021-11-28 DIAGNOSIS — E559 Vitamin D deficiency, unspecified: Secondary | ICD-10-CM | POA: Diagnosis not present

## 2021-11-28 DIAGNOSIS — G5730 Lesion of lateral popliteal nerve, unspecified lower limb: Secondary | ICD-10-CM | POA: Diagnosis not present

## 2021-11-28 DIAGNOSIS — M5417 Radiculopathy, lumbosacral region: Secondary | ICD-10-CM | POA: Diagnosis not present

## 2021-11-28 DIAGNOSIS — G629 Polyneuropathy, unspecified: Secondary | ICD-10-CM | POA: Diagnosis not present

## 2021-12-07 DIAGNOSIS — M79622 Pain in left upper arm: Secondary | ICD-10-CM | POA: Diagnosis not present

## 2021-12-29 DIAGNOSIS — G5601 Carpal tunnel syndrome, right upper limb: Secondary | ICD-10-CM | POA: Diagnosis not present

## 2021-12-29 DIAGNOSIS — G5603 Carpal tunnel syndrome, bilateral upper limbs: Secondary | ICD-10-CM | POA: Diagnosis not present

## 2022-01-04 ENCOUNTER — Telehealth: Payer: Self-pay | Admitting: *Deleted

## 2022-01-04 ENCOUNTER — Ambulatory Visit: Payer: Medicare Other

## 2022-01-04 ENCOUNTER — Other Ambulatory Visit: Payer: Self-pay | Admitting: Specialist

## 2022-01-04 DIAGNOSIS — M546 Pain in thoracic spine: Secondary | ICD-10-CM

## 2022-01-04 NOTE — Patient Instructions (Signed)
Visit Information ° °Thank you for allowing me to share the care management and care coordination services that are available to you as part of your health plan and services through your primary care provider and medical home. Please reach out to me at [phone] if the care management/care coordination team may be of assistance to you in the future.  ° °Lumir Demetriou, RN, MSN, BSN, CCM °Care Management Coordinator °[facility]  °[phone]  °

## 2022-01-04 NOTE — Chronic Care Management (AMB) (Signed)
?  Care Management  ? ?Note ? ?01/04/2022 ?Name: Autumn Garcia MRN: [id number] DOB: 1961-05-09 ? ?[name] is a 61 y.o. year old female who is a primary care patient of Metheney, Rene Kocher, MD. I reached out to Lavell Islam by phone today offer care coordination services.  ? ?Ms. Fukuda was given information about care management services today including:  ?Care management services include personalized support from designated clinical staff supervised by her physician, including individualized plan of care and coordination with other care providers ?24/7 contact phone numbers for assistance for urgent and routine care needs. ?The patient may stop care management services at any time by phone call to the office staff. ? ?Patient agreed to services and verbal consent obtained.  ? ?Follow up plan: ?Telephone appointment with care management team member scheduled for: 01/04/2022 ? ?Sharnette Kitamura, CCMA ?Care Guide, Embedded Care Coordination ?[facility]  ?Direct Dial: [phone] ? ? ?

## 2022-01-04 NOTE — Chronic Care Management (AMB) (Signed)
? ?  Care Management  ? ?Follow Up Note ? ? ?01/04/2022 ?Name: Autumn Garcia MRN: [id number] DOB: 1961-01-06 ? ? ?Referred by: Hali Marry, MD ?Reason for referral : No chief complaint on file. ? ? ?RNCM called to complete initial assessment to assist in identifying any care management needs. Autumn Garcia reports a history of seizures, panic attacks/anxiety, depression, agoraphobia and PTSD. PHQ2 score 3, PHQ 9 score 9. She states she has been in search of a therapist. She states she has lost 75lbs in the last year from around 200 pounds to 123 pounds by eating healthier: fruits, vegetables, lean meats. She also states she exercises and loves to garden-part of her coping strategies. Autumn Garcia has a Service Dog, "Autumn Garcia", to notify her of pending seizure activity and she reports she is never left alone. She expresses that she has a very supportive husband. She does not drive, but her husband provides transportation to all her appointments. She denies food insecurity.  She does not take any scheduled medications and denies any medication questions or concerns. Autumn Garcia reports she is being followed by Neurologist for for some numbness of her toes. A1C discussed-last noted A1C 5.8 on 02/22/21.  ? ?She denies any pharmacy or nursing care management needs. No pharmacy or nursing needs identified at this time. RNCM will not follow, but will send referral to LCSW. RNCM provided contact number and encouraged to call RNCM if needs change. ? ?Follow Up Plan:  Social work referral-patient request assistance with finding a therapist. ? ?Thea Silversmith, RN, MSN, BSN, CCM ?Care Management Coordinator ?[facility] ?[phone]  ?

## 2022-01-05 ENCOUNTER — Telehealth: Payer: Self-pay | Admitting: *Deleted

## 2022-01-05 NOTE — Chronic Care Management (AMB) (Signed)
?  Chronic Care Management  ? ?Note ? ?01/05/2022 ?Name: Autumn Garcia MRN: [id number] DOB: 1961-06-14 ? ?Autumn Garcia is a 61 y.o. year old female who is a primary care patient of Metheney, Rene Kocher, MD. Autumn Garcia is currently enrolled in care management services. An additional referral for Licensed Clinical SW was placed.  ? ?Follow up plan: ?Unsuccessful telephone outreach attempt made. A HIPAA compliant phone message was left for the patient providing contact information and requesting a return call.  ? ?Jamerion Cabello, CCMA ?Care Guide, Embedded Care Coordination ?[facility]  ?Direct Dial: [phone] ? ? ?

## 2022-01-08 ENCOUNTER — Encounter: Payer: Self-pay | Admitting: Emergency Medicine

## 2022-01-08 ENCOUNTER — Other Ambulatory Visit: Payer: Self-pay

## 2022-01-08 ENCOUNTER — Emergency Department (INDEPENDENT_AMBULATORY_CARE_PROVIDER_SITE_OTHER)
Admission: EM | Admit: 2022-01-08 | Discharge: 2022-01-08 | Disposition: A | Discharge status: Home / Self Care | Payer: Medicare Other | Source: Home / Self Care

## 2022-01-08 DIAGNOSIS — U071 COVID-19: Secondary | ICD-10-CM | POA: Diagnosis not present

## 2022-01-08 NOTE — ED Provider Notes (Signed)
?[facility] ? ? ? ?CSN: [id number] ?Arrival date & time: 01/08/22  1228 ? ? ?  ? ?History   ?Chief Complaint ?Chief Complaint  ?Patient presents with  ? Covid Positive  ? ? ?HPI ?Autumn Garcia is a 61 y.o. female.  ? ?HPI ?Patient presents today for evaluation of upper respiratory symptoms associated with the COVID-19 virus.  Patient tested positive for COVID x1 day ago.  Current symptoms include nasal congestion, occasional cough, and postnasal drainage.  She has not experienced any shortness of breath, chest pain, or generalized weakness.  Her husband tested positive for COVID 5 days ago. ? ?Past Medical History:  ?Diagnosis Date  ? Anxiety   ? Chronic kidney disease   ? Depression   ? Eating disorder   ? Epilepsy ([place])   ? HSV infection   ? Kidney stones   ? Murmur   ? PTSD (post-traumatic stress disorder)   ? Skin cancer of forehead   ? If could be a more serious cancer and will not know until after surgery  ? TB (tuberculosis)   ? Tested positive and treated  ? ? ?Patient Active Problem List  ? Diagnosis Date Noted  ? Impingement syndrome, shoulder, left 06/10/2021  ? Metatarsalgia 06/10/2021  ? Orthostatic hypotension 01/12/2021  ? Elevated blood pressure reading 01/12/2021  ? Recurrent UTI 11/30/2020  ? IFG (impaired fasting glucose) 11/30/2020  ? Bee allergy status 09/27/2018  ? Hyperlipidemia 04/26/2015  ? Herpes simplex type II infection 04/26/2014  ? Vertigo 11/13/2013  ? Vitamin D deficiency 10/29/2013  ? Basal cell carcinoma of skin 10/29/2013  ? Ventral hernia without obstruction or gangrene 01/27/2013  ? Dissociative disorder 06/03/2012  ? PTSD (post-traumatic stress disorder) 11/15/2011  ?  Class: Chronic  ? Dissociative identity disorder ([place]) 10/06/2011  ?  Class: Chronic  ? Borderline personality disorder ([place]) 10/03/2011  ? HOT FLASHES 09/03/2009  ? ANXIETY DISORDER, GENERALIZED 06/14/2009  ? KNEE PAIN 01/26/2009  ? Other convulsions 01/26/2009  ? ? ?Past Surgical History:   ?Procedure Laterality Date  ? CERVICAL POLYPECTOMY    ? CHOLECYSTECTOMY    ? KIDNEY SURGERY    ? LAPAROSCOPIC NEPHRECTOMY  2010.    ? Dr. Angeline Slim  at [facility] for large stone  ? left knee surgery    ? Removal of basil cell carcinoma    ? ? ?OB History   ? ? Gravida  ?3  ? Para  ?0  ? Term  ?   ? Preterm  ?   ? AB  ?3  ? Living  ?0  ?  ? ? SAB  ?1  ? IAB  ?2  ? Ectopic  ?   ? Multiple  ?   ? Live Births  ?   ?   ?  ?  ? ? ? ?Home Medications   ? ?Prior to Admission medications   ?Medication Sig Start Date End Date Taking? Authorizing Provider  ?diazepam (VALIUM) 2 MG tablet Take 1 tablet (2 mg total) by mouth every 8 (eight) hours as needed for anxiety. 03/03/20   Hali Marry, MD  ?EPINEPHrine 0.3 mg/0.3 mL IJ SOAJ injection USE AS DIRECTED AND AS NEEDED FOR SEVERE ALLERGIC REACTION 03/02/20   Hali Marry, MD  ?estradiol (ESTRACE) 0.1 MG/GM vaginal cream SMARTSIG:2 Gram(s) Vaginal Daily 11/08/21   [provider]  ?fluocinonide ointment (LIDEX) 0.05 % Apply topically 2 (two) times daily as needed. ?Patient not taking: Reported on 01/04/2022 09/08/21  [provider]  ?L-Lysine 500 MG TABS Take 500 mg by mouth. 2 times a week    [provider]  ?methylPREDNISolone (MEDROL DOSEPAK) 4 MG TBPK tablet See admin instructions. ?Patient not taking: Reported on 01/04/2022 09/13/21   [provider]  ?mupirocin ointment (BACTROBAN) 2 % Apply to rash areas twice a day ?Patient not taking: Reported on 01/04/2022 09/28/21   Hali Marry, MD  ?nystatin ointment (MYCOSTATIN) Apply topically 2 (two) times daily as needed. 09/07/21   [provider]  ?triamcinolone cream (KENALOG) 0.1 % Apply 1 application topically daily as needed. 05/06/21   Samuel Bouche, NP  ?triamcinolone cream (KENALOG) 0.5 % Apply 1 application topically 2 (two) times daily. To affected areas. 05/06/21   Samuel Bouche, NP  ? ? ?Family History ?Family History  ?Problem Relation Age of Onset  ? Bipolar disorder  Mother   ? Heart attack Mother   ?     MI in her late 18s  ? Skin cancer Mother   ? Bipolar disorder Sister   ? Dementia Brother   ? Cancer Cousin   ?     breast  ? Hypertension Brother   ? Cancer Sister   ?     breast,brain,cervical ovarian  ? Cancer Maternal Aunt   ?     breast  ? Breast cancer Maternal Grandmother   ? Anemia Neg Hx   ? Arrhythmia Neg Hx   ? Asthma Neg Hx   ? Clotting disorder Neg Hx   ? Fainting Neg Hx   ? Heart disease Neg Hx   ? Heart failure Neg Hx   ? Hyperlipidemia Neg Hx   ? ? ?Social History ?Social History  ? ?Tobacco Use  ? Smoking status: Never  ? Smokeless tobacco: Never  ?Vaping Use  ? Vaping Use: Never used  ?Substance Use Topics  ? Alcohol use: Not Currently  ?  Alcohol/week: 0.0 standard drinks  ?  Comment: Occasional wine  ? Drug use: No  ? ? ? ?Allergies   ?Demerol [meperidine], Latex, Nickel, Other, Ciprofloxacin, Citrus, Codeine, Erythromycin, Lidocaine, Meperidine hcl, Peanut oil, Peanut-containing drug products, and Soy allergy ? ? ?Review of Systems ?Review of Systems ?Pertinent negatives listed in HPI  ?Physical Exam ?Triage Vital Signs ?ED Triage Vitals  ?Enc Vitals Group  ?   BP 01/08/22 1316 113/73  ?   Pulse Rate 01/08/22 1316 63  ?   Resp 01/08/22 1316 17  ?   Temp 01/08/22 1316 99.2 ?F (37.3 ?C)  ?   Temp Source 01/08/22 1316 Oral  ?   SpO2 01/08/22 1316 99 %  ?   Weight --   ?   Height 01/08/22 1318 '5\' 4"'$  (1.626 m)  ?   Head Circumference --   ?   Peak Flow --   ?   Pain Score 01/08/22 1318 0  ?   Pain Loc --   ?   Pain Edu? --   ?   Excl. in [place]? --   ? ?No data found. ? ?Updated Vital Signs ?BP 113/73 (BP Location: Right Arm)   Pulse 63   Temp 99.2 ?F (37.3 ?C) (Oral)   Resp 17   Ht '5\' 4"'$  (1.626 m)   SpO2 99%   BMI 23.17 kg/m?  ? ?Visual Acuity ?Right Eye Distance:   ?Left Eye Distance:   ?Bilateral Distance:   ? ?Right Eye Near:   ?Left Eye Near:    ?Bilateral Near:    ? ?  Physical Exam ? ?General Appearance:    Alert, cooperative, no distress  ?HENT:    Normocephalic, ears normal, nares mucosal edema with congestion, rhinorrhea, cervical adenopathy present   ?Eyes:    PERRL, conjunctiva/corneas clear, EOM's intact       ?Lungs:     Clear to auscultation bilaterally, respirations unlabored  ?Heart:    Regular rate and rhythm  ?Neurologic:   Awake, alert, oriented x 3. No apparent focal neurological           defect.   ?  ? ?UC Treatments / Results  ?Labs ?(all labs ordered are listed, but only abnormal results are displayed) ?Labs Reviewed - No data to display ? ?EKG ? ? ?Radiology ?No results found. ? ?Procedures ?Procedures (including critical care time) ? ?Medications Ordered in UC ?Medications - No data to display ? ?Initial Impression / Assessment and Plan / UC Course  ?I have reviewed the triage vital signs and the nursing notes. ? ?Pertinent labs & imaging results that were available during my care of the patient were reviewed by me and considered in my medical decision making (see chart for details). ? ?  ?Patient is overall well-appearing.  We discussed risk factors for complications associated with COVID and antivirals however patient only has 1 kidney and symptoms are mild she opted against antiviral treatment.  Recommended symptomatic treatment with Mucinex and over-the-counter Delsym cough syrup.  Vital signs are stable.  Patient patient discharged with red flag precautions if any of her symptoms worsen. ?Final Clinical Impressions(s) / UC Diagnoses  ? ?Final diagnoses:  ?COVID-19 virus infection  ? ? ? ?Discharge Instructions   ? ?  ?COVID/Flu test pending. Symptom management warranted only Mucinex and Delsym for cough if needed, Manage fever with Tylenol and ibuprofen.  Nasal symptoms with over-the-counter antihistamines recommended.  Treatment per discharge medications/discharge instructions.  Red flags/ER precautions given. The most current CDC isolation/quarantine recommendation advised.  ? ? ? ?ED Prescriptions   ?None ?  ? ?PDMP not reviewed this  encounter. ?  ?Scot Jun, FNP ?01/08/22 1422 ? ?

## 2022-01-08 NOTE — Discharge Instructions (Signed)
COVID/Flu test pending. Symptom management warranted only Mucinex and Delsym for cough if needed, Manage fever with Tylenol and ibuprofen.  Nasal symptoms with over-the-counter antihistamines recommended.  Treatment per discharge medications/discharge instructions.  Red flags/ER precautions given. The most current CDC isolation/quarantine recommendation advised.  ? ?

## 2022-01-08 NOTE — ED Triage Notes (Signed)
COVID positive - home test  ?Congestion  ?Fever 101.1 yesterday  ?No pain meds yesterday  ?Husband also is positive - dx 5 days ago  ?

## 2022-01-10 ENCOUNTER — Telehealth: Payer: Self-pay | Admitting: Family Medicine

## 2022-01-10 MED ORDER — PAXLOVID (300/100) 20 X 150 MG & 10 X 100MG PO TBPK: ORAL_TABLET | ORAL | 0 refills | Status: DC

## 2022-01-10 NOTE — Telephone Encounter (Signed)
Patient called with worsening COVID symptoms and wishes treatment. She tested positive 4 days ago and is still within her 5 d window for treatment.  She has only one kidney but consistently has had normal kidney function.  I believe she is appropriate for PAXLOVID and this is sent to her pharmacy; ?

## 2022-01-10 NOTE — Telephone Encounter (Signed)
Pt called to notify of Paxlovid Rx called in to [facility] by Dr. Meda Coffee. She verbalizes understanding. (Pt had called previously to report worsening of s/s since dx of COVID on 4/9 at [facility] w/ fever of 101; Dr. Meda Coffee was notified and new orders received).  ?

## 2022-01-11 ENCOUNTER — Telehealth (INDEPENDENT_AMBULATORY_CARE_PROVIDER_SITE_OTHER): Payer: Medicare Other | Admitting: Medical-Surgical

## 2022-01-11 DIAGNOSIS — U071 COVID-19: Secondary | ICD-10-CM

## 2022-01-11 NOTE — Progress Notes (Signed)
Virtual Visit via Video Note ? ?I connected with Autumn Garcia on 01/11/22 at 11:10 AM EDT by a video enabled telemedicine application and verified that I am speaking with the correct person using two identifiers. ?  ?I discussed the limitations of evaluation and management by telemedicine and the availability of in person appointments. The patient expressed understanding and agreed to proceed. ? ?Patient location: home ?Provider locations: office ? ?Subjective:   ? ?CC: COVID-19 infection ? ?HPI: ?Pleasant 61 year old female presenting via [facility] video visit for current COVID-19 infection.  She was evaluated at urgent care on 01/08/2022 where she tested positive for COVID-19 in the setting of exposure to her COVID positive husband.  Yesterday there was a telephone call to Urgent Care and they prescribed Paxlovid. Today, she reports being tired, coughing, sneezing, nausea, headache, and eye pressure. She has had a fever T-max 101.6 but this is responding to OTC treatment. Taking Dayquil for as needed symptom relief. Kept her appointment to be safe just in case there were issues with starting the Paxlovid. She has taken 2 doses of the medication and notes that she started feeling better a couple of hours after the first dose. No significant side effects of the medication outside of a bitter taste in her mouth.  ? ?Past medical history, Surgical history, Family history not pertinant except as noted below, Social history, Allergies, and medications have been entered into the medical record, reviewed, and corrections made.  ? ?Review of Systems: See HPI for pertinent positives and negatives.  ? ?Objective:   ? ?General: Speaking clearly in complete sentences without any shortness of breath.  Alert and oriented x3.  Normal judgment. No apparent acute distress. ? ?Impression and Recommendations:   ? ?1. COVID-19 virus infection ?Continue Paxlovid as prescribed. Continue supportive care with OTC medications.  Increase fluids and rest. Eat small frequent meals. Reviewed symptoms that should prompt UC/ED care.  ? ?I discussed the assessment and treatment plan with the patient. The patient was provided an opportunity to ask questions and all were answered. The patient agreed with the plan and demonstrated an understanding of the instructions. ?  ?The patient was advised to call back or seek an in-person evaluation if the symptoms worsen or if the condition fails to improve as anticipated. ? ?20 minutes of non-face-to-face time was provided during this encounter. ? ?Return if symptoms worsen or fail to improve. ? ?Clearnce Sorrel, DNP, APRN, FNP-BC ?[facility] ?

## 2022-01-18 ENCOUNTER — Telehealth: Payer: Medicare Other | Admitting: *Deleted

## 2022-01-18 ENCOUNTER — Telehealth: Payer: Self-pay | Admitting: *Deleted

## 2022-01-18 NOTE — Telephone Encounter (Signed)
?  Care Management  ? ?Follow Up Note ? ? ?01/18/2022 ? ?Name: Autumn Garcia MRN: [id number] DOB: 1960/12/06 ? ?Referred By: Hali Marry, MD ? ?Reason for Referral:  Chronic Care Management Needs in Patient with Generalized Anxiety Disorder, Borderline Personality Disorder, Dissociative Disorder, Dissociative Identity Disorder, and Post-Traumatic Stress Disorder. ? ?An unsuccessful telephone outreach was attempted today. The patient was referred to the case management team for assistance with care management and care coordination. A HIPAA compliant message was left on voicemail, providing contact information, encouraging patient to return CSW's call at her earliest convenience.  ? ?Follow-Up Plan:  Request placed with [facility] to schedule an initial telephone outreach call for patient with LCSW. ? ?Nat Christen LCSW ?Licensed Clinical Social Worker ?[facility] ?[phone]  ?

## 2022-01-21 ENCOUNTER — Other Ambulatory Visit: Payer: Medicare Other

## 2022-01-23 NOTE — Chronic Care Management (AMB) (Signed)
?  Care Management  ? ?Note ? ?01/23/2022 ?Name: Autumn Garcia MRN: [id number] DOB: 10-31-1960 ? ?Autumn Garcia is a 61 y.o. year old female who is a primary care patient of Hali Marry, MD and is actively engaged with the care management team. I reached out to [place] by phone today to assist with re-scheduling a follow up visit with the Licensed Clinical Social Worker ? ?Follow up plan: ?Patient declines further follow up and engagement by the care management team. Appropriate care team members and provider have been notified via electronic communication.  ? ?Sian Rockers, CCMA ?Care Guide, Embedded Care Coordination ?[facility]  ?Direct Dial: [phone] ? ? ?

## 2022-01-28 ENCOUNTER — Other Ambulatory Visit: Payer: Medicare Other

## 2022-01-28 IMAGING — MR MR THORACIC SPINE W/O CM
5 series · 42 of 48 positions shown · non-contrast
Comparison: None Available.

CLINICAL DATA: Thoracic spine pain

EXAM:
MRI THORACIC SPINE WITHOUT CONTRAST
TECHNIQUE: Multiplanar, multisequence MR imaging of the thoracic spine was
performed. No intravenous contrast was administered.

[Series 6: T2 · sagittal · 3.0mm · 1.06mm/px · 5 of 15 slices shown (1 of 2)]
[im 1/15]
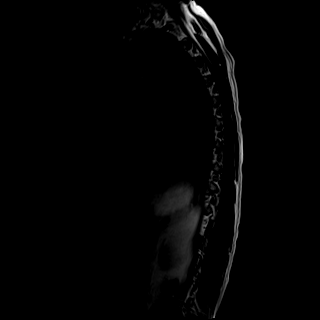
[im 4/15]
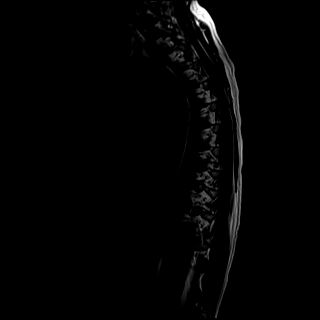
[im 8/15]
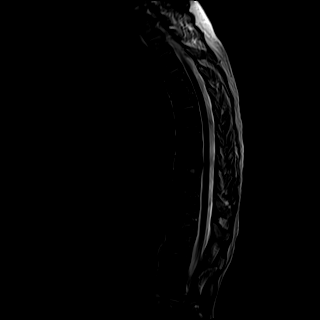
[im 11/15]
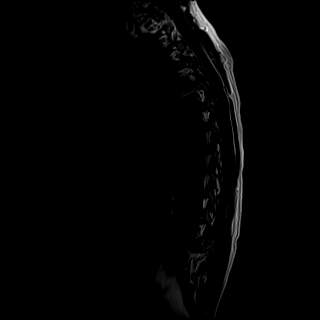
[im 15/15]
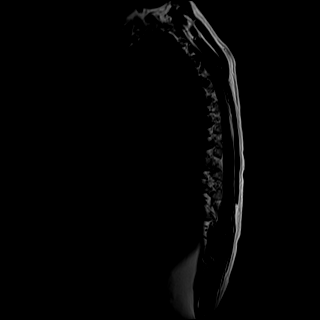

[Series 7: T1 · sagittal · 3.0mm · 1.06mm/px · 6 of 15 slices shown]
[im 1/15]
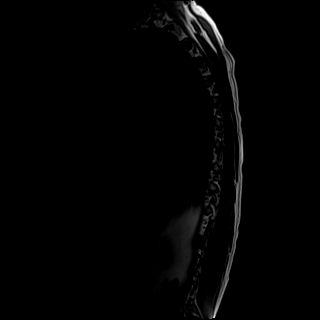
[im 3/15]
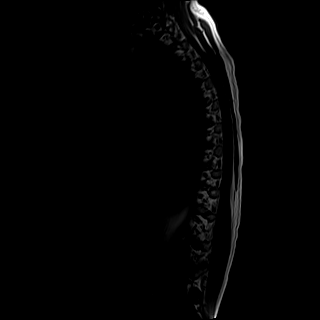
[im 6/15]
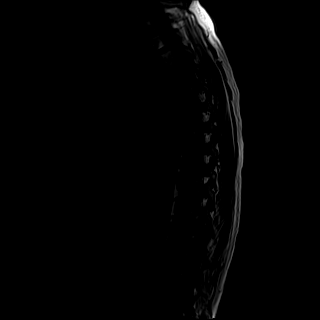
[im 9/15]
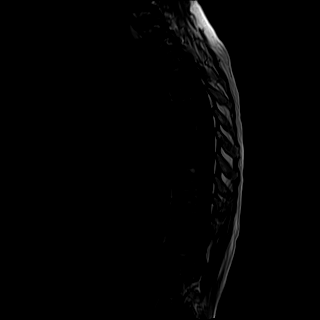
[im 12/15]
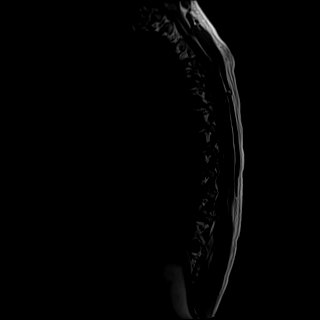
[im 15/15]
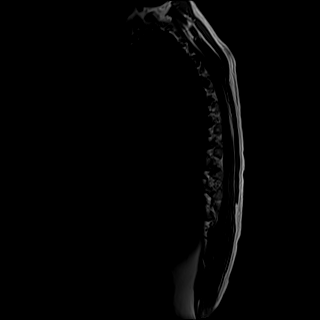

[Series 8: STIR · sagittal · 3.0mm · 1.06mm/px · 6 of 15 slices shown]
[im 1/15]
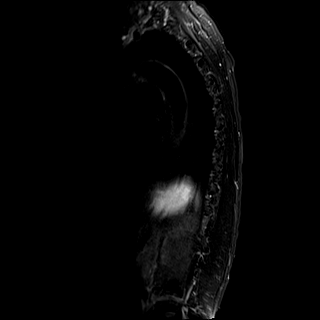
[im 3/15]
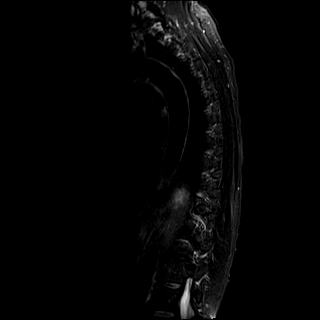
[im 6/15]
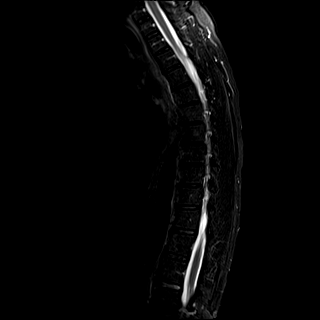
[im 9/15]
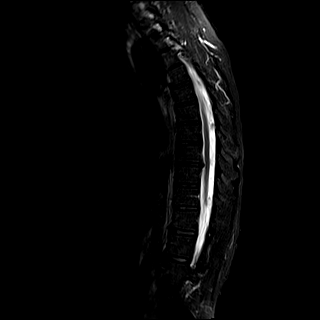
[im 12/15]
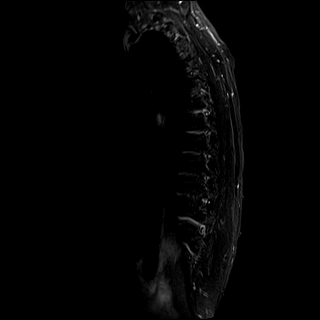
[im 15/15]
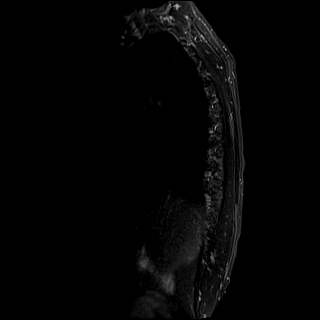

[Series 9: T2 · axial · 5.0mm · 0.78mm/px · z∈[-227,-31]mm · 15 of 39 slices shown (2 of 2)]
[im 1/39]
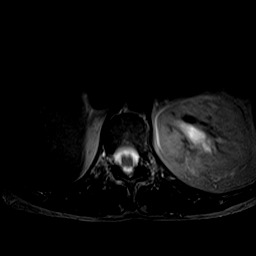
[im 3/39]
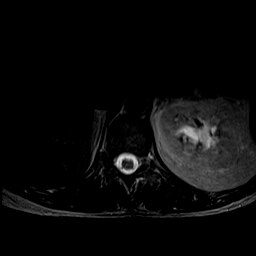
[im 6/39]
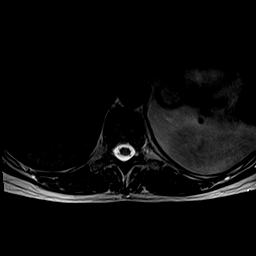
[im 9/39]
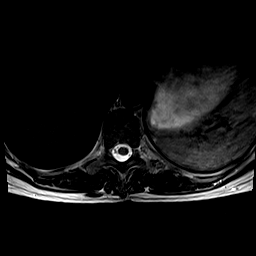
[im 11/39]
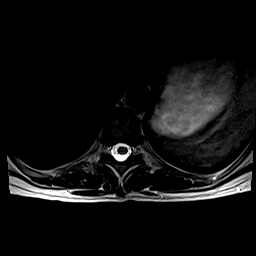
[im 14/39]
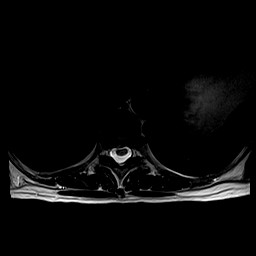
[im 17/39]
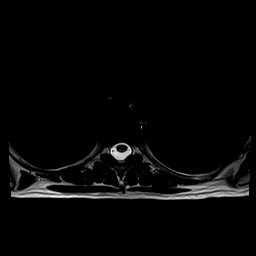
[im 20/39]
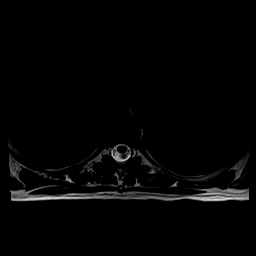
[im 22/39]
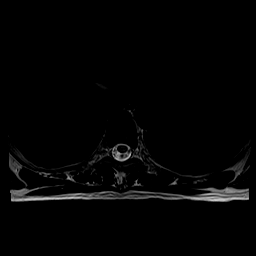
[im 25/39]
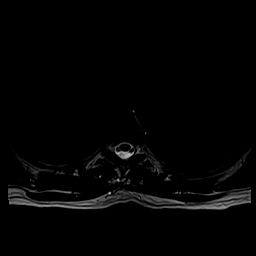
[im 28/39]
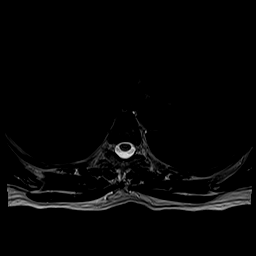
[im 30/39]
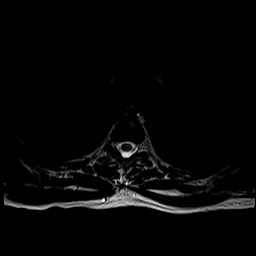
[im 33/39]
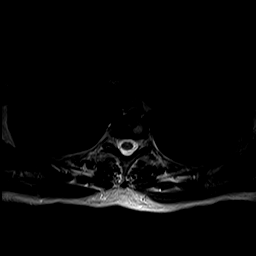
[im 36/39]
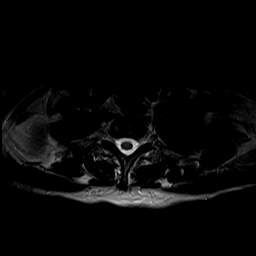
[im 39/39]
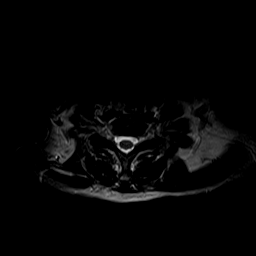

[Series 10: ax mpgr · axial · 5.0mm · 0.39mm/px · z∈[-232,-20]mm · 10 of 42 slices shown]
[im 1/42]
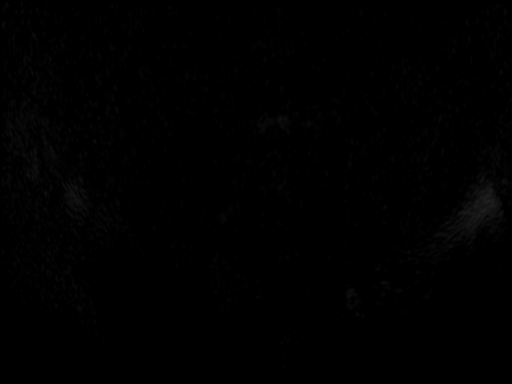
[im 3/42]
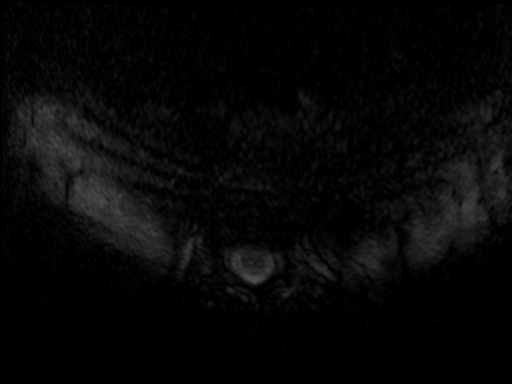
[im 6/42]
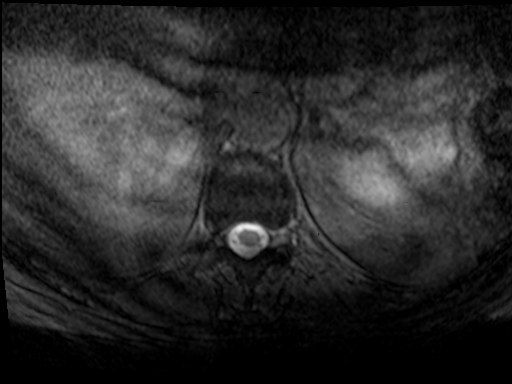
[im 9/42]
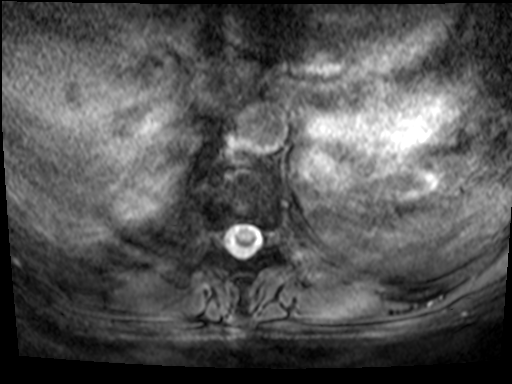
[im 14/42]
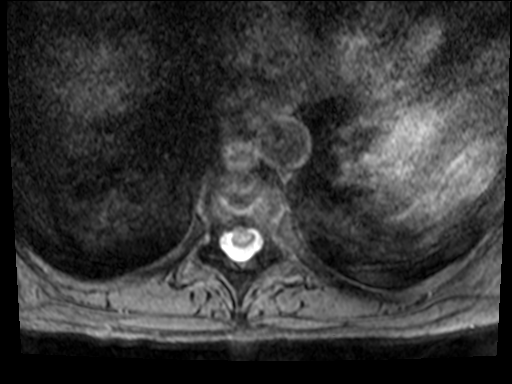
[im 20/42]
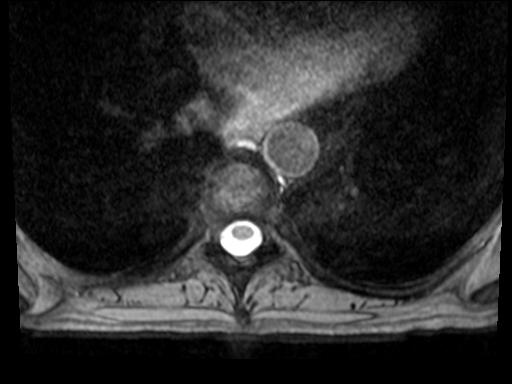
[im 25/42]
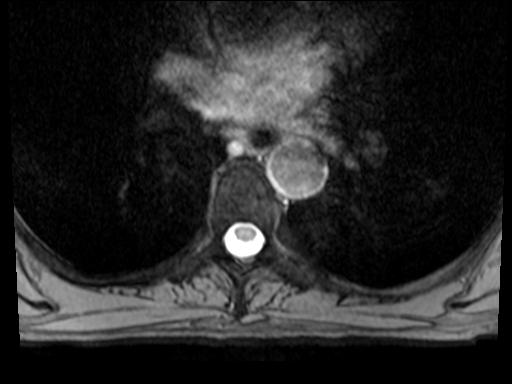
[im 31/42]
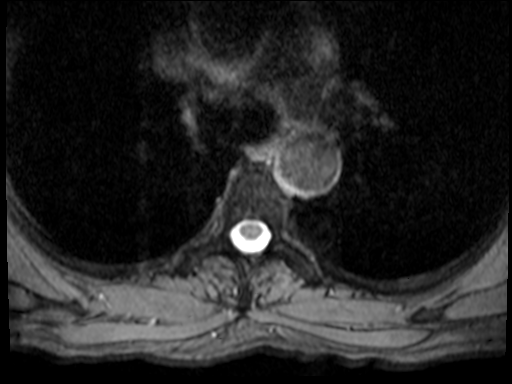
[im 36/42]
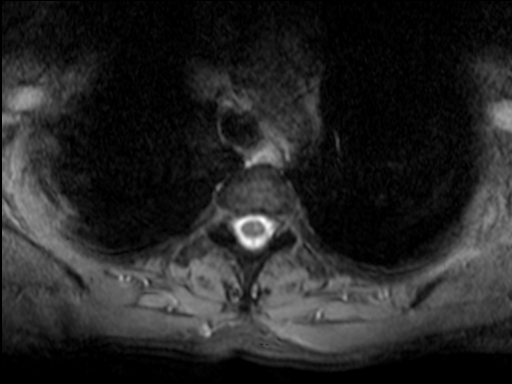
[im 42/42]
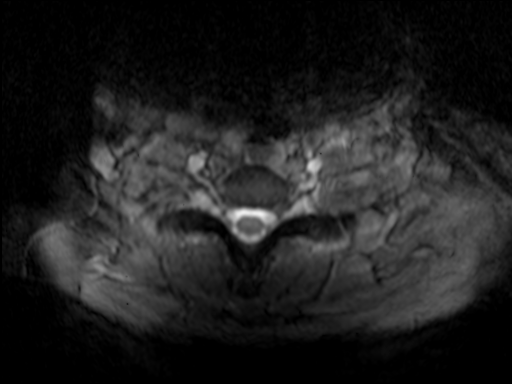

[42 of 48 positions shown; findings below may reference images not displayed]

FINDINGS: Alignment:  Preserved.

Vertebrae: Vertebral body heights are maintained. Marrow signal is
within normal limits.

Cord:  No abnormal signal.

Paraspinal and other soft tissues: Unremarkable.

Disc levels:

Intervertebral disc heights and signal are maintained. There is a
shallow central disc protrusion at T8-T9. No canal or foraminal
stenosis at any level.
IMPRESSION: Small disc herniation at T8-T9 without stenosis. Otherwise
unremarkable.

## 2022-01-30 ENCOUNTER — Telehealth: Payer: Self-pay | Admitting: Family Medicine

## 2022-01-30 NOTE — Telephone Encounter (Signed)
Patient called and said her Epipen has expired. She is requesting a refill for epinephrine.  ?

## 2022-01-31 ENCOUNTER — Other Ambulatory Visit: Payer: Self-pay

## 2022-01-31 MED ORDER — EPINEPHRINE 0.3 MG/0.3ML IJ SOAJ: INTRAMUSCULAR | 99 refills | Status: DC

## 2022-01-31 NOTE — Telephone Encounter (Signed)
Task completed. Per patient request - rx sent to [facility] in [place]. Patient also requested that allergy list be updated. She had a bad reaction to eating pecans.  ?

## 2022-02-04 ENCOUNTER — Ambulatory Visit (INDEPENDENT_AMBULATORY_CARE_PROVIDER_SITE_OTHER): Payer: Medicare Other

## 2022-02-04 DIAGNOSIS — M546 Pain in thoracic spine: Secondary | ICD-10-CM | POA: Diagnosis not present

## 2022-02-16 ENCOUNTER — Encounter: Payer: Self-pay | Admitting: Family Medicine

## 2022-02-17 NOTE — Telephone Encounter (Signed)
How recent was this?  Is she having any actual symptoms?

## 2022-02-20 NOTE — Telephone Encounter (Signed)
Needs appointment.  In the testing can be done through [facility].

## 2022-03-07 DIAGNOSIS — M5417 Radiculopathy, lumbosacral region: Secondary | ICD-10-CM | POA: Diagnosis not present

## 2022-03-07 DIAGNOSIS — M546 Pain in thoracic spine: Secondary | ICD-10-CM | POA: Diagnosis not present

## 2022-03-07 DIAGNOSIS — G629 Polyneuropathy, unspecified: Secondary | ICD-10-CM | POA: Diagnosis not present

## 2022-03-07 DIAGNOSIS — E559 Vitamin D deficiency, unspecified: Secondary | ICD-10-CM | POA: Diagnosis not present

## 2022-03-14 ENCOUNTER — Telehealth: Payer: Medicare Other | Admitting: Family Medicine

## 2022-03-27 DIAGNOSIS — M545 Low back pain, unspecified: Secondary | ICD-10-CM | POA: Diagnosis not present

## 2022-03-27 DIAGNOSIS — M25512 Pain in left shoulder: Secondary | ICD-10-CM | POA: Diagnosis not present

## 2022-03-28 ENCOUNTER — Encounter: Payer: Self-pay | Admitting: Family Medicine

## 2022-03-28 ENCOUNTER — Telehealth (INDEPENDENT_AMBULATORY_CARE_PROVIDER_SITE_OTHER): Payer: Medicare Other | Admitting: Family Medicine

## 2022-03-28 VITALS — Resp 16 | Ht 64.0 in | Wt 120.0 lb

## 2022-03-28 DIAGNOSIS — L739 Follicular disorder, unspecified: Secondary | ICD-10-CM | POA: Diagnosis not present

## 2022-03-28 DIAGNOSIS — W57XXXA Bitten or stung by nonvenomous insect and other nonvenomous arthropods, initial encounter: Secondary | ICD-10-CM

## 2022-03-28 DIAGNOSIS — F411 Generalized anxiety disorder: Secondary | ICD-10-CM | POA: Diagnosis not present

## 2022-03-28 MED ORDER — MUPIROCIN 2 % EX OINT: TOPICAL_OINTMENT | CUTANEOUS | 0 refills | Status: DC

## 2022-03-28 MED ORDER — TRIAMCINOLONE ACETONIDE 0.1 % EX CREA: 1.0000 | TOPICAL_CREAM | Freq: Every day | CUTANEOUS | 1 refills | Status: DC | PRN

## 2022-03-28 MED ORDER — DIAZEPAM 2 MG PO TABS: 2.0000 mg | ORAL_TABLET | Freq: Three times a day (TID) | ORAL | 0 refills | Status: AC | PRN

## 2022-03-28 NOTE — Assessment & Plan Note (Signed)
Did refill the mupirocin ointment which she currently uses twice a week.  Encouraged her to try going down to once a week to see if that still works well she also has triamcinolone cream which she uses as needed as well.

## 2022-03-29 ENCOUNTER — Other Ambulatory Visit: Payer: Self-pay

## 2022-03-29 DIAGNOSIS — T63481A Toxic effect of venom of other arthropod, accidental (unintentional), initial encounter: Secondary | ICD-10-CM | POA: Diagnosis not present

## 2022-03-29 DIAGNOSIS — W57XXXA Bitten or stung by nonvenomous insect and other nonvenomous arthropods, initial encounter: Secondary | ICD-10-CM | POA: Diagnosis not present

## 2022-03-30 ENCOUNTER — Encounter: Payer: Self-pay | Admitting: Family Medicine

## 2022-04-01 LAB — ALPHA-GAL PANEL
Allergen, Mutton, f88: <0.1 kU/L
Allergen, Pork, f26: <0.1 kU/L
Beef: <0.1 kU/L
CLASS: 0
CLASS: 0
Class: 0
GALACTOSE-ALPHA-1,3-GALACTOSE IGE*: <0.1 kU/L (ref <0.10)

## 2022-04-01 LAB — INTERPRETATION:

## 2022-04-03 NOTE — Progress Notes (Signed)
Hi Autumn Garcia,  Your panel for alpha gal appears to be negative which is fantastic news.  So okay to eat beef, button, and pork

## 2022-04-14 ENCOUNTER — Telehealth: Payer: Self-pay | Admitting: Family Medicine

## 2022-04-14 ENCOUNTER — Ambulatory Visit (INDEPENDENT_AMBULATORY_CARE_PROVIDER_SITE_OTHER): Payer: Medicare Other | Admitting: Family Medicine

## 2022-04-14 VITALS — BP 134/68 | HR 53 | Ht 64.0 in | Wt 131.0 lb

## 2022-04-14 DIAGNOSIS — N390 Urinary tract infection, site not specified: Secondary | ICD-10-CM | POA: Diagnosis not present

## 2022-04-14 LAB — POCT URINALYSIS DIP (CLINITEK)
Bilirubin, UA: NEGATIVE
Glucose, UA: NEGATIVE mg/dL
Ketones, POC UA: NEGATIVE mg/dL
Nitrite, UA: NEGATIVE
POC PROTEIN,UA: NEGATIVE
Spec Grav, UA: 1.01 (ref 1.010–1.025)
Urobilinogen, UA: 0.2 E.U./dL
pH, UA: 6.5 (ref 5.0–8.0)

## 2022-04-14 MED ORDER — NITROFURANTOIN MONOHYD MACRO 100 MG PO CAPS: 100.0000 mg | ORAL_CAPSULE | Freq: Two times a day (BID) | ORAL | 0 refills | Status: DC

## 2022-04-14 NOTE — Progress Notes (Signed)
Pt presents today with UTI symptoms: pressure and vaginal burning during urination.   Urinalysis completed. Culture: pending  Dr. Madilyn Fireman is aware and will review for medication treatment.

## 2022-04-14 NOTE — Progress Notes (Signed)
UTI - will tx with macrobid.   Meds ordered this encounter  Medications   nitrofurantoin, macrocrystal-monohydrate, (MACROBID) 100 MG capsule    Sig: Take 1 capsule (100 mg total) by mouth 2 (two) times daily.    Dispense:  10 capsule    Refill:  0

## 2022-04-14 NOTE — Telephone Encounter (Signed)
Patient dropped off lab results from an outside office for PCP. Placed in PCP box. AMUCK *04/14/22

## 2022-04-14 NOTE — Addendum Note (Signed)
Addended by: Beatrice Lecher D on: 04/14/2022 05:08 PM   Modules accepted: Level of Service

## 2022-04-16 LAB — URINE CULTURE
MICRO NUMBER:: 13652686
SPECIMEN QUALITY:: ADEQUATE

## 2022-04-17 NOTE — Progress Notes (Signed)
Urine culture is negative no sign of UTI.  Hopefully you are feeling much better.

## 2022-05-05 DIAGNOSIS — M5416 Radiculopathy, lumbar region: Secondary | ICD-10-CM | POA: Diagnosis not present

## 2022-05-17 ENCOUNTER — Other Ambulatory Visit: Payer: Self-pay | Admitting: Family Medicine

## 2022-05-17 ENCOUNTER — Telehealth: Payer: Self-pay

## 2022-05-17 ENCOUNTER — Encounter: Payer: Self-pay | Admitting: Family Medicine

## 2022-05-17 DIAGNOSIS — Z1231 Encounter for screening mammogram for malignant neoplasm of breast: Secondary | ICD-10-CM

## 2022-05-17 MED ORDER — VALACYCLOVIR HCL 1 G PO TABS: 1000.0000 mg | ORAL_TABLET | Freq: Every day | ORAL | 0 refills | Status: DC

## 2022-05-17 NOTE — Telephone Encounter (Signed)
Jayce call and requested medication for a herpes flare up. Last filled 2017.  Pended old prescription.

## 2022-05-17 NOTE — Telephone Encounter (Signed)
Task completed. Patient was informed that rx was sent to the pharmacy. During the call, patient asked if provider can also send in the valacyclovir ointment (new rx). Please advise, thanks.

## 2022-05-17 NOTE — Telephone Encounter (Signed)
Meds ordered this encounter  Medications   valACYclovir (VALTREX) 1000 MG tablet    Sig: Take 1 tablet (1,000 mg total) by mouth daily. For 5 days.    Dispense:  20 tablet    Refill:  0

## 2022-05-18 MED ORDER — ACYCLOVIR 5 % EX OINT: 1.0000 | TOPICAL_OINTMENT | CUTANEOUS | 3 refills | Status: DC

## 2022-05-18 NOTE — Telephone Encounter (Signed)
Topical sent to pharmacy

## 2022-05-18 NOTE — Telephone Encounter (Signed)
oint sent.  I think there is a couple of notes open for this.

## 2022-05-19 DIAGNOSIS — M5416 Radiculopathy, lumbar region: Secondary | ICD-10-CM | POA: Diagnosis not present

## 2022-05-19 NOTE — Telephone Encounter (Signed)
Task completed. Patient stated she has the ointment rx on hand and extends a thank you to the provider.

## 2022-05-24 ENCOUNTER — Encounter: Payer: Self-pay | Admitting: General Practice

## 2022-05-26 ENCOUNTER — Encounter: Payer: Self-pay | Admitting: Family Medicine

## 2022-05-29 NOTE — Telephone Encounter (Signed)
Okay, we can add to intolerance list.  Please see if she would like to try a different antiviral?

## 2022-06-25 ENCOUNTER — Ambulatory Visit
Admission: EM | Admit: 2022-06-25 | Discharge: 2022-06-25 | Disposition: A | Discharge status: Home / Self Care | Payer: Medicare Other | Attending: Emergency Medicine | Admitting: Emergency Medicine

## 2022-06-25 ENCOUNTER — Other Ambulatory Visit: Payer: Self-pay

## 2022-06-25 ENCOUNTER — Encounter: Payer: Self-pay | Admitting: Emergency Medicine

## 2022-06-25 DIAGNOSIS — Z792 Long term (current) use of antibiotics: Secondary | ICD-10-CM | POA: Diagnosis not present

## 2022-06-25 DIAGNOSIS — R319 Hematuria, unspecified: Secondary | ICD-10-CM | POA: Insufficient documentation

## 2022-06-25 DIAGNOSIS — R300 Dysuria: Secondary | ICD-10-CM | POA: Diagnosis not present

## 2022-06-25 DIAGNOSIS — N390 Urinary tract infection, site not specified: Secondary | ICD-10-CM | POA: Diagnosis not present

## 2022-06-25 DIAGNOSIS — R3915 Urgency of urination: Secondary | ICD-10-CM | POA: Insufficient documentation

## 2022-06-25 DIAGNOSIS — Z79899 Other long term (current) drug therapy: Secondary | ICD-10-CM | POA: Insufficient documentation

## 2022-06-25 LAB — POCT URINALYSIS DIP (MANUAL ENTRY)
Bilirubin, UA: NEGATIVE
Glucose, UA: NEGATIVE mg/dL
Ketones, POC UA: NEGATIVE mg/dL
Nitrite, UA: NEGATIVE
Protein Ur, POC: NEGATIVE mg/dL
Spec Grav, UA: <=1.005 — AB (ref 1.010–1.025)
Urobilinogen, UA: 0.2 E.U./dL
pH, UA: 6 (ref 5.0–8.0)

## 2022-06-25 MED ORDER — CEPHALEXIN 500 MG PO CAPS: 500.0000 mg | ORAL_CAPSULE | Freq: Two times a day (BID) | ORAL | 0 refills | Status: DC

## 2022-06-25 MED ORDER — PHENAZOPYRIDINE HCL 200 MG PO TABS: 200.0000 mg | ORAL_TABLET | Freq: Three times a day (TID) | ORAL | 0 refills | Status: DC | PRN

## 2022-06-25 NOTE — ED Provider Notes (Signed)
HPI  SUBJECTIVE:  Autumn Garcia is a 61 y.o. female who presents with dysuria, urgency, frequency starting yesterday.  No cloudy or odorous urine, hematuria, vaginal odor, itching, bleeding, discharge, rash.  She is in a long-term monogamous relationship with her husband, who is asymptomatic.  STDs are not a concern today.  She reports some nausea, but no vomiting, fevers, abdominal, back, pelvic pain.  She has not tried anything for symptoms.  No alleviating factors.  Symptoms worse with urination.  No antibiotics in the past month.  No antipyretic in the past 6 hours.  She has a past medical history of frequent UTIs, pyelonephritis, nephrolithiasis, status post right nephrectomy, prediabetes that is controlled with diet and weight loss.   Past Medical History:  Diagnosis Date   Anxiety    Chronic kidney disease    Depression    Eating disorder    Epilepsy ([place])    HSV infection    Kidney stones    Murmur    PTSD (post-traumatic stress disorder)    Skin cancer of forehead    If could be a more serious cancer and will not know until after surgery   TB (tuberculosis)    Tested positive and treated    Past Surgical History:  Procedure Laterality Date   CERVICAL POLYPECTOMY     [place]  2010.     Dr. Angeline Slim  at [facility] for large stone   left knee surgery     Removal of basil cell carcinoma      Family History  Problem Relation Age of Onset   Bipolar disorder Mother    Heart attack Mother        MI in her late 16s   Skin cancer Mother    Bipolar disorder Sister    Dementia Brother    Cancer Cousin        breast   Hypertension Brother    Cancer Sister        breast,brain,cervical ovarian   Cancer Maternal Aunt        breast   Breast cancer Maternal Grandmother    Anemia Neg Hx    Arrhythmia Neg Hx    Asthma Neg Hx    Clotting disorder Neg Hx    Fainting Neg Hx    Heart disease Neg Hx    Heart failure  Neg Hx    Hyperlipidemia Neg Hx     Social History   Tobacco Use   Smoking status: Never   Smokeless tobacco: Never  Vaping Use   Vaping Use: Never used  Substance Use Topics   Alcohol use: Not Currently    Alcohol/week: 0.0 standard drinks of alcohol    Comment: Occasional wine   Drug use: No    No current facility-administered medications for this encounter.  Current Outpatient Medications:    acyclovir ointment (ZOVIRAX) 5 %, Apply 1 Application topically every 3 (three) hours., Disp: 15 g, Rfl: 3   cephALEXin (KEFLEX) 500 MG capsule, Take 1 capsule (500 mg total) by mouth 2 (two) times daily., Disp: 14 capsule, Rfl: 0   Cranberry-Olive Leaf (URINARY TRACT HEALTH PO), Take by mouth daily., Disp: , Rfl:    diazepam (VALIUM) 2 MG tablet, Take 1 tablet (2 mg total) by mouth every 8 (eight) hours as needed for anxiety., Disp: 90 tablet, Rfl: 0   EPINEPHrine 0.3 mg/0.3 mL IJ SOAJ injection, USE AS DIRECTED AND AS  NEEDED FOR SEVERE ALLERGIC REACTION, Disp: 2 each, Rfl: prn   L-Lysine 500 MG TABS, Take 500 mg by mouth. 2 times a week, Disp: , Rfl:    magnesium citrate SOLN, Take 1 Bottle by mouth once., Disp: , Rfl:    mupirocin ointment (BACTROBAN) 2 %, Apply to affected area TID for 7 days., Disp: 30 g, Rfl: 0   phenazopyridine (PYRIDIUM) 200 MG tablet, Take 1 tablet (200 mg total) by mouth 3 (three) times daily as needed for pain., Disp: 6 tablet, Rfl: 0   triamcinolone cream (KENALOG) 0.1 %, Apply 1 application topically daily as needed., Disp: 30 g, Rfl: 1   triamcinolone cream (KENALOG) 0.1 %, Apply 1 Application topically daily as needed., Disp: 30 g, Rfl: 1   triamcinolone cream (KENALOG) 0.5 %, Apply 1 application topically 2 (two) times daily. To affected areas., Disp: 80 g, Rfl: 0   valACYclovir (VALTREX) 1000 MG tablet, Take 1 tablet (1,000 mg total) by mouth daily. For 5 days., Disp: 20 tablet, Rfl: 0  Allergies  Allergen Reactions   Demerol [Meperidine] Nausea And  Vomiting, Nausea Only and Other (See Comments)    seizures   Latex Rash   Nickel     infections   Other Other (See Comments)    Tree nuts (Pecans). Severe painful oral burning - per patient    Ciprofloxacin     REACTION: sucidal   Citrus     Geographic tongue   Codeine     REACTION: Halluncination   Erythromycin     REACTION: Vomiting   Lactose    Lidocaine Other (See Comments)    Pt unsure if allergic. Last reaction at the dentist and had a seizure, but could have been injected into a vein   Meperidine Hcl     REACTION: Muscle weakness, vomiting   Meperidine Hcl    Peanut Oil     Other reaction(s): Other (See Comments) ORAL ULCERS   Peanut-Containing Drug Products Nausea Only   Soy Allergy     Other reaction(s): Other (See Comments) MOUTH ULCER   Valacyclovir      ROS  As noted in HPI.   Physical Exam  BP 133/80 (BP Location: Right Arm)   Pulse 61   Temp 98.9 F (37.2 C) (Oral)   Resp 16   SpO2 100%   Constitutional: Well developed, well nourished, no acute distress Eyes:  EOMI, conjunctiva normal bilaterally HENT: Normocephalic, atraumatic,mucus membranes moist Respiratory: Normal inspiratory effort Cardiovascular: Normal rate GI: nondistended.  Soft, no suprapubic, flank tenderness Back: No left CVA tenderness skin: No rash, skin intact Musculoskeletal: no deformities Neurologic: Alert & oriented x 3, no focal neuro deficits Psychiatric: Speech and behavior appropriate   ED Course   Medications - No data to display  Orders Placed This Encounter  Procedures   Urine Culture    Standing Status:   Standing    Number of Occurrences:   1    Order Specific Question:   Indication    Answer:   Dysuria   POCT urinalysis dipstick    Standing Status:   Standing    Number of Occurrences:   1    Results for orders placed or performed during the hospital encounter of 06/25/22 (from the past 24 hour(s))  POCT urinalysis dipstick     Status: Abnormal    Collection Time: 06/25/22  3:47 PM  Result Value Ref Range   Color, UA straw (A) yellow   Clarity, UA cloudy (A)  clear   Glucose, UA negative negative mg/dL   Bilirubin, UA negative negative   Ketones, POC UA negative negative mg/dL   Spec Grav, UA <=1.005 (A) 1.010 - 1.025   Blood, UA small (A) negative   pH, UA 6.0 5.0 - 8.0   Protein Ur, POC negative negative mg/dL   Urobilinogen, UA 0.2 0.2 or 1.0 E.U./dL   Nitrite, UA Negative Negative   Leukocytes, UA Small (1+) (A) Negative   No results found.  ED Clinical Impression  1. Urinary tract infection with hematuria, site unspecified      ED Assessment/Plan     Calculated creatinine clearance from most recent labs 67 mL/min   Previous urine cultures reviewed.  Last positive urine culture was in December of last year, grew out strep agalactae which is sensitive to cephalosporins.  Patient states that she has tolerated cephalosporins before without any issues.  UA, history suggestive of urinary tract infection.  Sending urine off for culture to confirm diagnosis and antibiotic choice.   will also send off a swab for BV and yeast.  Follow-up with PCP as needed.  Discussed labs, MDM, treatment plan, and plan for follow-up with patient. Discussed sn/sx that should prompt return to the ED. patient agrees with plan.   Meds ordered this encounter  Medications   cephALEXin (KEFLEX) 500 MG capsule    Sig: Take 1 capsule (500 mg total) by mouth 2 (two) times daily.    Dispense:  14 capsule    Refill:  0   phenazopyridine (PYRIDIUM) 200 MG tablet    Sig: Take 1 tablet (200 mg total) by mouth 3 (three) times daily as needed for pain.    Dispense:  6 tablet    Refill:  0      *This clinic note was created using [employer]. Therefore, there may be occasional mistakes despite careful proofreading.  ?    Melynda Ripple, MD 06/26/22 [phone]

## 2022-06-25 NOTE — Discharge Instructions (Addendum)
Finish the antibiotics, even if you feel better.  Continue drinking plenty of fluids.  We will contact you if your BV or yeast testing comes back positive we will call in the appropriate medication.  If we need to change her antibiotics based on urine culture results, we will contact you and do that as well.  I hope you feel better soon.

## 2022-06-25 NOTE — ED Triage Notes (Signed)
Patient presents to Urgent Care with complaints of dysuria, frequent urination since 1 day ago. Patient reports only having one kidney. Does stay well hydrated.

## 2022-06-27 LAB — CERVICOVAGINAL ANCILLARY ONLY
Bacterial Vaginitis (gardnerella): NEGATIVE
Candida Glabrata: NEGATIVE
Candida Vaginitis: NEGATIVE
Comment: NEGATIVE
Comment: NEGATIVE
Comment: NEGATIVE

## 2022-06-27 LAB — URINE CULTURE: Culture: NO GROWTH

## 2022-06-30 ENCOUNTER — Telehealth: Payer: Medicare Other | Admitting: Family Medicine

## 2022-07-03 ENCOUNTER — Telehealth (INDEPENDENT_AMBULATORY_CARE_PROVIDER_SITE_OTHER): Payer: Medicare Other | Admitting: Family Medicine

## 2022-07-03 VITALS — BP 133/70 | HR 64

## 2022-07-03 DIAGNOSIS — R300 Dysuria: Secondary | ICD-10-CM

## 2022-07-03 NOTE — Progress Notes (Signed)
    Virtual Visit via Video Note  I connected with [name] on 07/03/22 at 11:30 AM EDT by a video enabled telemedicine application and verified that I am speaking with the correct person using two identifiers.   I discussed the limitations of evaluation and management by telemedicine and the availability of in person appointments. The patient expressed understanding and agreed to proceed.  Patient location: at home Provider location: in office   Established Patient Office Visit  Subjective   Patient ID: Autumn Garcia, female    DOB: 04/11/1961  Age: 61 y.o. MRN: [id number]  No chief complaint on file.   HPI  Pt stated that she keeps having UTI's and these usually happen over the weekend. She will get dysuria and frequency sxs that can last several days and then resolve. In fact she was seen on UC on 9/24 for UTI sxs. The urine culture was neg. she says and just looking back through her chart she has noticed a pattern of often times having a negative urine culture.  Even though she feels like she is having pretty significant dysuria symptoms.  June 25, 2022: Negative urine culture April 14, 2022: Negative urine culture August 24, 2021: Positive urine culture for Enterococcus faecalis, though less than 50,000 CFU January 10, 2021: Negative urine culture October 19, 2020: Negative urine culture   She has an appointment scheduled with Urology on 07/05/2022.   Pt stated that she only has 1 kidney and this is a concern for her.     ROS    Objective:     BP 133/70   Pulse 64    Physical Exam   No results found for any visits on 07/03/22.    The ASCVD Risk score (Arnett DK, et al., 2019) failed to calculate for the following reasons:   Unable to determine if patient is Non-Hispanic African American    Assessment & Plan:   Problem List Items Addressed This Visit   None Visit Diagnoses     Dysuria    -  Primary      Dysuria-we  discussed the possibility of a condition called interstitial cystitis.  She does have an upcoming appointment with one of the APP's at her urology office.  She normally follows there yearly since she has 1 kidney and does have a history of some cysts and stones on that kidney.  We also discussed that in general it takes a certain amount of colony-forming units to be considered a positive UTI otherwise it is considered to be an external contaminant especially from just what is called a clean-catch urine.  We did discuss that there are further evaluations that they can be done as well as even treatment options if it does turn out to be interstitial cystitis.  No follow-ups on file.    I discussed the assessment and treatment plan with the patient. The patient was provided an opportunity to ask questions and all were answered. The patient agreed with the plan and demonstrated an understanding of the instructions.   The patient was advised to call back or seek an in-person evaluation if the symptoms worsen or if the condition fails to improve as anticipated.  I spent 15 minutes on the day of the encounter to include pre-visit record review, face-to-face time with the patient and post visit ordering of test.   Beatrice Lecher, MD

## 2022-07-03 NOTE — Progress Notes (Signed)
Pt stated that she keeps having UTI's and these usually happen over the weekend.  She has an appointment scheduled with Urology on 07/05/2022.   Pt stated that she only has 1 kidney and this is a concern for her.

## 2022-07-05 DIAGNOSIS — R399 Unspecified symptoms and signs involving the genitourinary system: Secondary | ICD-10-CM | POA: Diagnosis not present

## 2022-07-05 DIAGNOSIS — N390 Urinary tract infection, site not specified: Secondary | ICD-10-CM | POA: Diagnosis not present

## 2022-07-05 DIAGNOSIS — Z905 Acquired absence of kidney: Secondary | ICD-10-CM | POA: Diagnosis not present

## 2022-07-05 DIAGNOSIS — R102 Pelvic and perineal pain: Secondary | ICD-10-CM | POA: Diagnosis not present

## 2022-07-05 DIAGNOSIS — N200 Calculus of kidney: Secondary | ICD-10-CM | POA: Diagnosis not present

## 2022-07-05 DIAGNOSIS — R39198 Other difficulties with micturition: Secondary | ICD-10-CM | POA: Diagnosis not present

## 2022-07-06 ENCOUNTER — Ambulatory Visit (INDEPENDENT_AMBULATORY_CARE_PROVIDER_SITE_OTHER): Payer: Medicare Other

## 2022-07-06 DIAGNOSIS — Z1231 Encounter for screening mammogram for malignant neoplasm of breast: Secondary | ICD-10-CM

## 2022-07-07 NOTE — Progress Notes (Signed)
Please call patient. Normal mammogram.  Repeat in 1 year.  

## 2022-07-11 DIAGNOSIS — R39198 Other difficulties with micturition: Secondary | ICD-10-CM | POA: Diagnosis not present

## 2022-07-11 DIAGNOSIS — N3289 Other specified disorders of bladder: Secondary | ICD-10-CM | POA: Diagnosis not present

## 2022-07-11 DIAGNOSIS — R102 Pelvic and perineal pain: Secondary | ICD-10-CM | POA: Diagnosis not present

## 2022-07-17 ENCOUNTER — Other Ambulatory Visit: Payer: Self-pay | Admitting: Orthopedic Surgery

## 2022-07-17 DIAGNOSIS — M533 Sacrococcygeal disorders, not elsewhere classified: Secondary | ICD-10-CM

## 2022-07-20 ENCOUNTER — Ambulatory Visit (INDEPENDENT_AMBULATORY_CARE_PROVIDER_SITE_OTHER): Payer: Medicare Other | Admitting: Family Medicine

## 2022-07-20 ENCOUNTER — Encounter: Payer: Self-pay | Admitting: Family Medicine

## 2022-07-20 VITALS — BP 131/62 | HR 60 | Temp 98.9°F | Ht 64.0 in | Wt 125.0 lb

## 2022-07-20 DIAGNOSIS — J020 Streptococcal pharyngitis: Secondary | ICD-10-CM | POA: Diagnosis not present

## 2022-07-20 DIAGNOSIS — J029 Acute pharyngitis, unspecified: Secondary | ICD-10-CM | POA: Diagnosis not present

## 2022-07-20 LAB — POCT RAPID STREP A (OFFICE): Rapid Strep A Screen: NEGATIVE

## 2022-07-20 MED ORDER — AMOXICILLIN 500 MG PO CAPS: 1000.0000 mg | ORAL_CAPSULE | Freq: Every day | ORAL | 0 refills | Status: AC

## 2022-07-20 NOTE — Progress Notes (Signed)
   Acute Office Visit  Subjective:     Patient ID: Autumn Garcia, female    DOB: 10-Feb-1961, 61 y.o.   MRN: [id number]  Chief Complaint  Patient presents with   Sore Throat    HPI Patient is in today for sore throat. Patient feels like she has pockets of pus in the back of her throat. Pt describes sore throat as knives in the back of her throat.  Review of Systems  Constitutional:  Negative for chills and fever.  HENT:  Positive for sore throat.   Respiratory:  Negative for cough and shortness of breath.   Cardiovascular:  Negative for chest pain.  Neurological:  Negative for headaches.        Objective:    BP 131/62   Pulse 60   Temp 98.9 F (37.2 C)   Ht '5\' 4"'$  (1.626 m)   Wt 125 lb (56.7 kg)   SpO2 100%   BMI 21.46 kg/m    Physical Exam Vitals and nursing note reviewed.  Constitutional:      General: She is not in acute distress.    Appearance: Normal appearance.  HENT:     Head: Normocephalic and atraumatic.     Right Ear: External ear normal.     Left Ear: External ear normal.     Nose: Nose normal.     Mouth/Throat:     Pharynx: Oropharyngeal exudate and posterior oropharyngeal erythema present.  Eyes:     Conjunctiva/sclera: Conjunctivae normal.  Cardiovascular:     Rate and Rhythm: Normal rate and regular rhythm.  Pulmonary:     Effort: Pulmonary effort is normal.     Breath sounds: Normal breath sounds.  Neurological:     General: No focal deficit present.     Mental Status: She is alert and oriented to person, place, and time.  Psychiatric:        Mood and Affect: Mood normal.        Behavior: Behavior normal.        Thought Content: Thought content normal.        Judgment: Judgment normal.     No results found for any visits on 07/20/22.      Assessment & Plan:   Problem List Items Addressed This Visit       Respiratory   Strep pharyngitis    - erythema of throat with white exudate noted in the back of the throat. Will  go ahead and treat for strep with amoxicillin.       Relevant Medications   amoxicillin (AMOXIL) 500 MG capsule   Other Visit Diagnoses     Sore throat    -  Primary       Meds ordered this encounter  Medications   amoxicillin (AMOXIL) 500 MG capsule    Sig: Take 2 capsules (1,000 mg total) by mouth daily for 10 days.    Dispense:  20 capsule    Refill:  0    Return if symptoms worsen or fail to improve.  Owens Loffler, DO

## 2022-07-20 NOTE — Assessment & Plan Note (Signed)
-   erythema of throat with white exudate noted in the back of the throat. Will go ahead and treat for strep with amoxicillin.

## 2022-07-20 NOTE — Addendum Note (Signed)
Addended by: Cline Crock on: 07/20/2022 11:47 AM   Modules accepted: Orders

## 2022-07-20 NOTE — Telephone Encounter (Signed)
Looks like you diagnosed Strep pharyngitis and gave Amoxicillin. Okay to tell the patient this and to continue medication?

## 2022-07-21 DIAGNOSIS — Z905 Acquired absence of kidney: Secondary | ICD-10-CM | POA: Diagnosis not present

## 2022-07-21 DIAGNOSIS — R399 Unspecified symptoms and signs involving the genitourinary system: Secondary | ICD-10-CM | POA: Diagnosis not present

## 2022-07-21 DIAGNOSIS — R102 Pelvic and perineal pain: Secondary | ICD-10-CM | POA: Diagnosis not present

## 2022-08-01 ENCOUNTER — Ambulatory Visit (INDEPENDENT_AMBULATORY_CARE_PROVIDER_SITE_OTHER): Payer: Medicare Other

## 2022-08-01 DIAGNOSIS — M79605 Pain in left leg: Secondary | ICD-10-CM | POA: Diagnosis not present

## 2022-08-01 DIAGNOSIS — M79604 Pain in right leg: Secondary | ICD-10-CM | POA: Diagnosis not present

## 2022-08-01 DIAGNOSIS — M533 Sacrococcygeal disorders, not elsewhere classified: Secondary | ICD-10-CM

## 2022-08-01 DIAGNOSIS — M4316 Spondylolisthesis, lumbar region: Secondary | ICD-10-CM | POA: Diagnosis not present

## 2022-08-01 DIAGNOSIS — M48061 Spinal stenosis, lumbar region without neurogenic claudication: Secondary | ICD-10-CM | POA: Diagnosis not present

## 2022-08-01 DIAGNOSIS — M5126 Other intervertebral disc displacement, lumbar region: Secondary | ICD-10-CM | POA: Diagnosis not present

## 2022-11-08 DIAGNOSIS — N1330 Unspecified hydronephrosis: Secondary | ICD-10-CM | POA: Diagnosis not present

## 2022-11-08 DIAGNOSIS — N132 Hydronephrosis with renal and ureteral calculous obstruction: Secondary | ICD-10-CM | POA: Diagnosis not present

## 2022-11-08 DIAGNOSIS — N200 Calculus of kidney: Secondary | ICD-10-CM | POA: Diagnosis not present

## 2022-11-08 DIAGNOSIS — Z905 Acquired absence of kidney: Secondary | ICD-10-CM | POA: Diagnosis not present

## 2022-11-08 DIAGNOSIS — N390 Urinary tract infection, site not specified: Secondary | ICD-10-CM | POA: Diagnosis not present

## 2022-11-08 DIAGNOSIS — N281 Cyst of kidney, acquired: Secondary | ICD-10-CM | POA: Diagnosis not present

## 2022-11-08 DIAGNOSIS — R102 Pelvic and perineal pain: Secondary | ICD-10-CM | POA: Diagnosis not present

## 2023-01-15 ENCOUNTER — Encounter: Payer: Self-pay | Admitting: *Deleted

## 2023-01-25 ENCOUNTER — Telehealth: Payer: Self-pay | Admitting: Family Medicine

## 2023-01-25 NOTE — Telephone Encounter (Signed)
Contacted Autumn Garcia to schedule their annual wellness visit. Patient declined to schedule AWV at this time.  Cira Servant Patient Engineer, production II Direct Dial: [phone]

## 2023-03-05 ENCOUNTER — Encounter: Payer: Self-pay | Admitting: Family Medicine

## 2023-03-05 ENCOUNTER — Ambulatory Visit (INDEPENDENT_AMBULATORY_CARE_PROVIDER_SITE_OTHER): Payer: Medicare Other | Admitting: Family Medicine

## 2023-03-05 VITALS — BP 133/85 | HR 66 | Ht 64.0 in | Wt 125.0 lb

## 2023-03-05 DIAGNOSIS — S90561A Insect bite (nonvenomous), right ankle, initial encounter: Secondary | ICD-10-CM | POA: Diagnosis not present

## 2023-03-05 DIAGNOSIS — W57XXXA Bitten or stung by nonvenomous insect and other nonvenomous arthropods, initial encounter: Secondary | ICD-10-CM | POA: Diagnosis not present

## 2023-03-05 DIAGNOSIS — S90569A Insect bite (nonvenomous), unspecified ankle, initial encounter: Secondary | ICD-10-CM | POA: Insufficient documentation

## 2023-03-05 MED ORDER — DOXYCYCLINE HYCLATE 100 MG PO TABS: 100.0000 mg | ORAL_TABLET | Freq: Two times a day (BID) | ORAL | 0 refills | Status: AC

## 2023-03-05 NOTE — Patient Instructions (Signed)
Tick Bite Information, Adult  Ticks are insects that can bite. Most ticks live in bushes and grassy areas. They climb onto people and animals that go by. Then they bite. Some ticks carry germs that can make you sick. How can I prevent tick bites? Take these steps: Before you go outside: Wear long sleeves and long pants. Wear light-colored clothes. Tuck your pant legs into your socks. Use an insect repellent that has 20% or higher of the ingredients DEET, picaridin, or IR3535. Follow the instructions on the label. Put it on: Bare skin. Avoid your eyes and mouth areas. The tops of your boots. Your pant legs. The ends of your sleeves. If you use an insect repellent that has the ingredient permethrin, follow the instructions on the label. Do not put permethrin on the skin. Put it on: Clothing. Shoes. Outdoor gear. Tents. When you are outside Avoid walking through long grass. Stay in the middle of the trail. Do not touch the bushes. Check for ticks on your clothes, hair, and skin often while you are outside. Check again before you go inside. When you go indoors Check your clothes for ticks. Dry your clothes in a dryer on high heat for 10 minutes or more. If clothes are damp, more time may be needed. Wash your clothes right away if they need to be washed. Use hot water. Check your pets and outdoor gear. Shower right away. Check your body for ticks. Do a full body check using a mirror. Check your clothes, skin, head, neck, armpits, waist, groin, and joint areas. What is the right way to remove a tick? Remove the tick from your skin as soon as possible. Do not remove the tick with your bare fingers. Do not try to remove a tick with heat, alcohol, petroleum jelly, or fingernail polish. To remove a tick that is crawling on your skin: Go outside and brush the tick off. Use tape or a lint roller. To remove a tick that is biting: Wash your hands. If you have gloves, put them on. Use tweezers,  curved forceps, or a tick-removal tool to grasp the tick. Grasp the tick as close to your skin and as close to the tick's head as possible. Gently pull up until the tick lets go. Try to keep the tick's head attached to its body. Do not twist or jerk the tick. Do not squeeze or crush the tick. What should I do after taking out a tick? Clean the bite area and your hands with soap and water, rubbing alcohol, or an iodine wash. If you have an antiseptic cream or ointment, put a small amount on the bite area. Wash and disinfect any instruments that you used to remove the tick. How should I get rid of a live tick? To get rid of a live tick, use one of these methods: Place the tick in rubbing alcohol. Place it in a bag or container you can close tightly. Throw it away. Wrap it tightly in tape. Throw it away. Flush it down the toilet. Where to find more information Centers for Disease Control and Prevention: cdc.gov/ticks U.S. Environmental Protection Agency: epa.gov/insect-repellents Contact a doctor if: You have a fever or chills. You have a red rash that makes a circle (bull's-eye rash) in the bite area. You have redness and swelling where the tick bit you. You have a headache or stiff neck. You have pain in a muscle, joint, or bone. You are more tired than normal. You have trouble walking or   moving your legs. You have numbness in your legs. You have tender or swollen lymph glands. You have belly (abdominal) pain, vomiting, watery poop (diarrhea), or weight loss. Get help right away if: You cannot remove a tick. You cannot move (have paralysis) or feel weak. You are feeling worse or have new symptoms. You find a tick that is biting you and filled with blood, especially if you are in an area where diseases from ticks are common. Summary Ticks may carry germs that can make you sick. To prevent tick bites wear long sleeves, long pants, and light colors. Use insect repellent. Follow the  instructions on the label. If the tick is biting, remove it right away. Do not try to remove it with heat, alcohol, petroleum jelly, or fingernail polish. Contact a doctor if you have symptoms of a disease after being bitten by a tick. This information is not intended to replace advice given to you by your health care provider. Make sure you discuss any questions you have with your health care provider. Document Revised: 12/19/2021 Document Reviewed: 12/19/2021 Elsevier Patient Education  2024 [employer]  

## 2023-03-05 NOTE — Progress Notes (Signed)
Autumn Garcia - 62 y.o. female MRN [id number]  Date of birth: 11/09/1960  Subjective Chief Complaint  Patient presents with   Tick Removal    HPI Autumn Garcia is a 62 y.o. female here today with complaint of tick bite.  She had tick bite to her right ankle  This occurred about 5 days ago.  Tick was not very engorged when she removed it.  She does have a small amount of redness, induration and itching around the bite that developed a few days ago.  Denies flu like symptoms, abdominal pain, joint pain or other rashes.    ROS:  A comprehensive ROS was completed and negative except as noted per HPI   Allergies  Allergen Reactions   Demerol [Meperidine] Nausea And Vomiting, Nausea Only and Other (See Comments)    seizures   Latex Rash   Nickel     infections   Other Other (See Comments)    Tree nuts (Pecans). Severe painful oral burning - per patient    Ciprofloxacin     REACTION: sucidal   Citrus     Geographic tongue   Codeine     REACTION: Halluncination   Erythromycin     REACTION: Vomiting   Lactose    Lidocaine Other (See Comments)    Pt unsure if allergic. Last reaction at the dentist and had a seizure, but could have been injected into a vein   Meperidine Hcl     REACTION: Muscle weakness, vomiting   Meperidine Hcl    Peanut Oil     Other reaction(s): Other (See Comments) ORAL ULCERS   Peanut-Containing Drug Products Nausea Only   Soy Allergy     Other reaction(s): Other (See Comments) MOUTH ULCER   Valacyclovir     Past Medical History:  Diagnosis Date   Anxiety    Chronic kidney disease    Depression    Eating disorder    Epilepsy (HCC)    HSV infection    Kidney stones    Murmur    PTSD (post-traumatic stress disorder)    Skin cancer of forehead    If could be a more serious cancer and will not know until after surgery   TB (tuberculosis)    Tested positive and treated    Past Surgical History:  Procedure Laterality Date    CERVICAL POLYPECTOMY     CHOLECYSTECTOMY     KIDNEY SURGERY     LAPAROSCOPIC NEPHRECTOMY  2010.     Dr. Ceasar Mons  at [facility] for large stone   left knee surgery     Removal of basil cell carcinoma      Social History   Socioeconomic History   Marital status: Married    Spouse name: Sharma Covert   Number of children: 0   Years of education: 14   Highest education level: Some college, no degree  Occupational History   Occupation: disability    Associate Professor: UNEMPLOYED  Tobacco Use   Smoking status: Never   Smokeless tobacco: Never  Vaping Use   Vaping Use: Never used  Substance and Sexual Activity   Alcohol use: Not Currently    Alcohol/week: 0.0 standard drinks of alcohol    Comment: Occasional wine   Drug use: No   Sexual activity: Yes    Partners: Male    Birth control/protection: Post-menopausal  Other Topics Concern   Not on file  Social History Narrative   Lives with her husband. Enjoys gardening and painting.  Social Determinants of Health   Financial Resource Strain: Low Risk  (02/14/2021)   Overall Financial Resource Strain (CARDIA)    Difficulty of Paying Living Expenses: Not hard at all  Food Insecurity: No Food Insecurity (01/04/2022)   Hunger Vital Sign    Worried About Running Out of Food in the Last Year: Never true    Ran Out of Food in the Last Year: Never true  Transportation Needs: No Transportation Needs (01/04/2022)   PRAPARE - Administrator, Civil Service (Medical): No    Lack of Transportation (Non-Medical): No  Physical Activity: Sufficiently Active (02/14/2021)   Exercise Vital Sign    Days of Exercise per Week: 7 days    Minutes of Exercise per Session: 30 min  Stress: No Stress Concern Present (02/14/2021)   [employer] of Occupational Health - Occupational Stress Questionnaire    Feeling of Stress : Not at all  Social Connections: Socially Isolated (02/14/2021)   Social Connection and Isolation Panel [NHANES]    Frequency of  Communication with Friends and Family: Never    Frequency of Social Gatherings with Friends and Family: Never    Attends Religious Services: Never    Database administrator or Organizations: No    Attends Engineer, structural: Never    Marital Status: Married    Family History  Problem Relation Age of Onset   Bipolar disorder Mother    Heart attack Mother        MI in her late 1s   Skin cancer Mother    Bipolar disorder Sister    Dementia Brother    Cancer Cousin        breast   Hypertension Brother    Cancer Sister        breast,brain,cervical ovarian   Cancer Maternal Aunt        breast   Breast cancer Maternal Grandmother    Anemia Neg Hx    Arrhythmia Neg Hx    Asthma Neg Hx    Clotting disorder Neg Hx    Fainting Neg Hx    Heart disease Neg Hx    Heart failure Neg Hx    Hyperlipidemia Neg Hx     Health Maintenance  Topic Date Due   Zoster Vaccines- Shingrix (1 of 2) Never done   Fecal DNA (Cologuard)  Never done   COVID-19 Vaccine (3 - Pfizer risk series) 02/26/2020   Medicare Annual Wellness (AWV)  06/30/2022   INFLUENZA VACCINE  05/03/2023   PAP SMEAR-Modifier  11/16/2023   MAMMOGRAM  07/06/2024   DTaP/Tdap/Td (3 - Td or Tdap) 07/09/2030   Hepatitis C Screening  Completed   HIV Screening  Completed   Pneumococcal Vaccine 8-8 Years old  Aged Out   HPV VACCINES  Aged Out     ----------------------------------------------------------------------------------------------------------------------------------------------------------------------------------------------------------------- Physical Exam BP 133/85 (BP Location: Right Arm, Patient Position: Sitting, Cuff Size: Small)   Pulse 66   Ht 5\' 4"  (1.626 m)   Wt 125 lb (56.7 kg)   SpO2 99%   BMI 21.46 kg/m   Physical Exam Constitutional:      Appearance: Normal appearance.  HENT:     Head: Normocephalic and atraumatic.  Eyes:     General: No scleral icterus. Cardiovascular:     Rate  and Rhythm: Normal rate and regular rhythm.  Pulmonary:     Effort: Pulmonary effort is normal.     Breath sounds: Normal breath sounds.  Neurological:  Mental Status: She is alert.  Psychiatric:        Mood and Affect: Mood normal.        Behavior: Behavior normal.     ------------------------------------------------------------------------------------------------------------------------------------------------------------------------------------------------------------------- Assessment and Plan  Tick bite of ankle She does have some induration around the area where the bite occurred.  She would feel better to have antibiotics for prophylaxis and will add on doxycycline.  Discussed symptoms of tick borne illness, she will contact the clinic if having new/worsening symptoms.    Meds ordered this encounter  Medications   doxycycline (VIBRA-TABS) 100 MG tablet    Sig: Take 1 tablet (100 mg total) by mouth 2 (two) times daily for 7 days.    Dispense:  14 tablet    Refill:  0    No follow-ups on file.    This visit occurred during the SARS-CoV-2 public health emergency.  Safety protocols were in place, including screening questions prior to the visit, additional usage of staff PPE, and extensive cleaning of exam room while observing appropriate contact time as indicated for disinfecting solutions.

## 2023-03-05 NOTE — Assessment & Plan Note (Signed)
She does have some induration around the area where the bite occurred.  She would feel better to have antibiotics for prophylaxis and will add on doxycycline.  Discussed symptoms of tick borne illness, she will contact the clinic if having new/worsening symptoms.

## 2023-07-18 ENCOUNTER — Other Ambulatory Visit: Payer: Self-pay | Admitting: Family Medicine

## 2023-07-18 DIAGNOSIS — Z1231 Encounter for screening mammogram for malignant neoplasm of breast: Secondary | ICD-10-CM

## 2023-07-19 ENCOUNTER — Ambulatory Visit: Payer: Medicare Other

## 2023-07-19 DIAGNOSIS — Z1231 Encounter for screening mammogram for malignant neoplasm of breast: Secondary | ICD-10-CM | POA: Diagnosis not present

## 2023-07-23 ENCOUNTER — Encounter: Payer: Self-pay | Admitting: Family Medicine

## 2023-07-23 NOTE — Progress Notes (Signed)
Mammogram shows a questionable area in the left breast so the imaging department will be contacting you soon to get you scheduled for a diagnostic mammogram and possibly an ultrasound.

## 2023-07-24 ENCOUNTER — Other Ambulatory Visit: Payer: Self-pay | Admitting: Family Medicine

## 2023-07-24 DIAGNOSIS — R928 Other abnormal and inconclusive findings on diagnostic imaging of breast: Secondary | ICD-10-CM

## 2023-08-08 ENCOUNTER — Other Ambulatory Visit: Payer: Medicare Other

## 2023-08-17 ENCOUNTER — Ambulatory Visit
Admission: EM | Admit: 2023-08-17 | Discharge: 2023-08-17 | Disposition: A | Discharge status: Home / Self Care | Payer: Medicare Other | Attending: Family Medicine | Admitting: Family Medicine

## 2023-08-17 DIAGNOSIS — J029 Acute pharyngitis, unspecified: Secondary | ICD-10-CM | POA: Diagnosis not present

## 2023-08-17 DIAGNOSIS — K122 Cellulitis and abscess of mouth: Secondary | ICD-10-CM

## 2023-08-17 LAB — POC SARS CORONAVIRUS 2 AG -  ED: SARS Coronavirus 2 Ag: NEGATIVE

## 2023-08-17 LAB — POCT RAPID STREP A (OFFICE): Rapid Strep A Screen: NEGATIVE

## 2023-08-17 MED ORDER — CEFDINIR 300 MG PO CAPS: 300.0000 mg | ORAL_CAPSULE | Freq: Two times a day (BID) | ORAL | 0 refills | Status: AC

## 2023-08-17 NOTE — ED Provider Notes (Signed)
[facility]    CSN: [id number] Arrival date & time: 08/17/23  1915      History   Chief Complaint Chief Complaint  Patient presents with   Sore Throat    HPI Autumn Garcia is a 62 y.o. female.   HPI Pleasant 62 year old female presents with sore throat, runny nose, sneezing for 3 days.  Patient reports pus pockets in the back of throat.  PMH significant for CKD, eating disorder and PTSD.  Past Medical History:  Diagnosis Date   Anxiety    Chronic kidney disease    Depression    Eating disorder    Epilepsy (HCC)    HSV infection    Kidney stones    Murmur    PTSD (post-traumatic stress disorder)    Skin cancer of forehead    If could be a more serious cancer and will not know until after surgery   TB (tuberculosis)    Tested positive and treated    Patient Active Problem List   Diagnosis Date Noted   Tick bite of ankle 03/05/2023   Strep pharyngitis 07/20/2022   Folliculitis 03/28/2022   Impingement syndrome, shoulder, left 06/10/2021   Metatarsalgia 06/10/2021   Orthostatic hypotension 01/12/2021   Elevated blood pressure reading 01/12/2021   Recurrent UTI 11/30/2020   IFG (impaired fasting glucose) 11/30/2020   Obsessional thoughts 10/20/2020   Depression 10/20/2020   Anorexia nervosa 10/20/2020   Bee allergy status 09/27/2018   Hyperlipidemia 04/26/2015   Herpes simplex type II infection 04/26/2014   Vertigo 11/13/2013   Vitamin D deficiency 10/29/2013   Basal cell carcinoma of skin 10/29/2013   Ventral hernia without obstruction or gangrene 01/27/2013   Dissociative disorder 06/03/2012   PTSD (post-traumatic stress disorder) 11/15/2011    Class: Chronic   Dissociative identity disorder (HCC) 10/06/2011    Class: Chronic   Borderline personality disorder (HCC) 10/03/2011   HOT FLASHES 09/03/2009   ANXIETY DISORDER, GENERALIZED 06/14/2009   KNEE PAIN 01/26/2009   Other convulsions 01/26/2009    Past Surgical History:   Procedure Laterality Date   CERVICAL POLYPECTOMY     CHOLECYSTECTOMY     KIDNEY SURGERY     LAPAROSCOPIC NEPHRECTOMY  2010.     Dr. Ceasar Mons  at [facility] for large stone   left knee surgery     Removal of basil cell carcinoma      OB History     Gravida  3   Para  0   Term      Preterm      AB  3   Living  0      SAB  1   IAB  2   Ectopic      Multiple      Live Births               Home Medications    Prior to Admission medications   Medication Sig Start Date End Date Taking? Authorizing Provider  cefdinir (OMNICEF) 300 MG capsule Take 1 capsule (300 mg total) by mouth 2 (two) times daily for 7 days. 08/17/23 08/24/23 Yes Trevor Iha, FNP  acyclovir ointment (ZOVIRAX) 5 % Apply 1 Application topically every 3 (three) hours. 05/18/22   Agapito Games, MD  Cranberry-Olive Leaf (URINARY TRACT HEALTH PO) Take by mouth daily.    [provider]  diazepam (VALIUM) 2 MG tablet Take 1 tablet (2 mg total) by mouth every 8 (eight) hours as needed for anxiety. 03/28/22  Agapito Games, MD  EPINEPHrine 0.3 mg/0.3 mL IJ SOAJ injection USE AS DIRECTED AND AS NEEDED FOR SEVERE ALLERGIC REACTION 01/31/22   Agapito Games, MD  L-Lysine 500 MG TABS Take 500 mg by mouth. 2 times a week    [provider]  MAGNESIUM CITRATE PO Take 400 mg by mouth 2 (two) times daily.    [provider]  triamcinolone cream (KENALOG) 0.1 % Apply 1 Application topically daily as needed. 03/28/22   Agapito Games, MD    Family History Family History  Problem Relation Age of Onset   Bipolar disorder Mother    Heart attack Mother        MI in her late 28s   Skin cancer Mother    Bipolar disorder Sister    Dementia Brother    Cancer Cousin        breast   Hypertension Brother    Cancer Sister        breast,brain,cervical ovarian   Cancer Maternal Aunt        breast   Breast cancer Maternal Grandmother    Anemia Neg Hx    Arrhythmia Neg  Hx    Asthma Neg Hx    Clotting disorder Neg Hx    Fainting Neg Hx    Heart disease Neg Hx    Heart failure Neg Hx    Hyperlipidemia Neg Hx     Social History Social History   Tobacco Use   Smoking status: Never   Smokeless tobacco: Never  Vaping Use   Vaping status: Never Used  Substance Use Topics   Alcohol use: Not Currently    Alcohol/week: 0.0 standard drinks of alcohol    Comment: Occasional wine   Drug use: No     Allergies   Demerol [meperidine], Latex, Nickel, Other, Ciprofloxacin, Citrus, Codeine, Erythromycin, Lactose, Lidocaine, Meperidine hcl, Meperidine hcl, Peanut oil, Peanut-containing drug products, Soy allergy, and Valacyclovir   Review of Systems Review of Systems  HENT:  Positive for sore throat.   All other systems reviewed and are negative.    Physical Exam Triage Vital Signs ED Triage Vitals  Encounter Vitals Group     BP      Systolic BP Percentile      Diastolic BP Percentile      Pulse      Resp      Temp      Temp src      SpO2      Weight      Height      Head Circumference      Peak Flow      Pain Score      Pain Loc      Pain Education      Exclude from Growth Chart    No data found.  Updated Vital Signs BP 132/80   Pulse (!) 55   Temp 98.3 F (36.8 C)   Resp 16   SpO2 98%   Physical Exam Vitals and nursing note reviewed.  Constitutional:      Appearance: Normal appearance. She is normal weight. She is not ill-appearing.  HENT:     Head: Normocephalic and atraumatic.     Right Ear: Tympanic membrane, ear canal and external ear normal.     Left Ear: Tympanic membrane, ear canal and external ear normal.     Mouth/Throat:     Mouth: Mucous membranes are moist.     Pharynx: Oropharynx is clear.  Uvula midline. Posterior oropharyngeal erythema and uvula swelling present.     Tonsils: Tonsillar exudate present. 2+ on the right. 2+ on the left.  Eyes:     Extraocular Movements: Extraocular movements intact.      Conjunctiva/sclera: Conjunctivae normal.     Pupils: Pupils are equal, round, and reactive to light.  Cardiovascular:     Rate and Rhythm: Normal rate and regular rhythm.     Pulses: Normal pulses.     Heart sounds: Normal heart sounds.  Pulmonary:     Effort: Pulmonary effort is normal.     Breath sounds: Normal breath sounds. No wheezing, rhonchi or rales.  Musculoskeletal:        General: Normal range of motion.     Cervical back: Normal range of motion and neck supple.  Skin:    General: Skin is warm and dry.  Neurological:     General: No focal deficit present.     Mental Status: She is alert and oriented to person, place, and time. Mental status is at baseline.  Psychiatric:        Mood and Affect: Mood normal.        Behavior: Behavior normal.        Thought Content: Thought content normal.      UC Treatments / Results  Labs (all labs ordered are listed, but only abnormal results are displayed) Labs Reviewed  POCT RAPID STREP A (OFFICE)  POC SARS CORONAVIRUS 2 AG -  ED    EKG   Radiology No results found.  Procedures Procedures (including critical care time)  Medications Ordered in UC Medications - No data to display  Initial Impression / Assessment and Plan / UC Course  I have reviewed the triage vital signs and the nursing notes.  Pertinent labs & imaging results that were available during my care of the patient were reviewed by me and considered in my medical decision making (see chart for details).     MDM: 1.  Uvulitis-Rx'd Cefdinir 300 mg capsule: Take 1 capsule twice daily x 7 days; 2.  Pharyngitis, unspecified etiology-Rx'd Cefdinir 300 mg capsule: Take 1 capsule twice daily x 7 days. Advised patient to take medication as directed with food to completion.  Encouraged to increase daily water intake to 64 ounces per day while taking this medication.  Advised if symptoms worsen and/or unresolved please follow-up with PCP or here for further evaluation.   Patient discharged home, he hemodynamically stable. Final Clinical Impressions(s) / UC Diagnoses   Final diagnoses:  Pharyngitis, unspecified etiology  Uvulitis     Discharge Instructions      Advised patient to take medication as directed with food to completion.  Encouraged to increase daily water intake to 64 ounces per day while taking this medication.  Advised if symptoms worsen and/or unresolved please follow-up with PCP or here for further evaluation.     ED Prescriptions     Medication Sig Dispense Auth. Provider   cefdinir (OMNICEF) 300 MG capsule Take 1 capsule (300 mg total) by mouth 2 (two) times daily for 7 days. 14 capsule Trevor Iha, FNP      PDMP not reviewed this encounter.   Trevor Iha, FNP 08/17/23 [phone]

## 2023-08-17 NOTE — Discharge Instructions (Addendum)
 Advised patient to take medication as directed with food to completion.  Encouraged to increase daily water intake to 64 ounces per day while taking this medication.  Advised if symptoms worsen and/or unresolved please follow-up with PCP or here for further evaluation.

## 2023-08-17 NOTE — ED Triage Notes (Signed)
Pt presents to uc co of sore throat, running nose, sneezing for 3 days.  the patient reports puss pockets in back of throat. Pt reports not otc meds.

## 2023-08-27 ENCOUNTER — Ambulatory Visit: Payer: Medicare Other

## 2023-08-27 ENCOUNTER — Ambulatory Visit
Admission: RE | Admit: 2023-08-27 | Discharge: 2023-08-27 | Disposition: A | Discharge status: Home / Self Care | Payer: Medicare Other | Source: Ambulatory Visit | Attending: Family Medicine | Admitting: Family Medicine

## 2023-08-27 DIAGNOSIS — R928 Other abnormal and inconclusive findings on diagnostic imaging of breast: Secondary | ICD-10-CM

## 2023-09-07 ENCOUNTER — Encounter: Payer: Self-pay | Admitting: Physician Assistant

## 2023-09-07 ENCOUNTER — Ambulatory Visit (INDEPENDENT_AMBULATORY_CARE_PROVIDER_SITE_OTHER): Payer: Medicare Other | Admitting: Physician Assistant

## 2023-09-07 VITALS — BP 113/60 | HR 57 | Temp 98.4°F | Resp 12 | Ht 64.0 in | Wt 120.0 lb

## 2023-09-07 DIAGNOSIS — B9689 Other specified bacterial agents as the cause of diseases classified elsewhere: Secondary | ICD-10-CM | POA: Diagnosis not present

## 2023-09-07 DIAGNOSIS — J22 Unspecified acute lower respiratory infection: Secondary | ICD-10-CM | POA: Diagnosis not present

## 2023-09-07 DIAGNOSIS — R062 Wheezing: Secondary | ICD-10-CM | POA: Diagnosis not present

## 2023-09-07 DIAGNOSIS — L089 Local infection of the skin and subcutaneous tissue, unspecified: Secondary | ICD-10-CM

## 2023-09-07 DIAGNOSIS — Z91030 Bee allergy status: Secondary | ICD-10-CM | POA: Diagnosis not present

## 2023-09-07 MED ORDER — MUPIROCIN 2 % EX OINT: TOPICAL_OINTMENT | CUTANEOUS | 3 refills | Status: DC

## 2023-09-07 MED ORDER — EPINEPHRINE 0.3 MG/0.3ML IJ SOAJ: INTRAMUSCULAR | 99 refills | Status: AC

## 2023-09-07 MED ORDER — IPRATROPIUM-ALBUTEROL 0.5-2.5 (3) MG/3ML IN SOLN
3.0000 mL | Freq: Once | RESPIRATORY_TRACT | Status: AC
  Administered 2023-09-07: 3 mL via RESPIRATORY_TRACT

## 2023-09-07 NOTE — Patient Instructions (Signed)
Start omnicef CXR in one week

## 2023-09-07 NOTE — Progress Notes (Signed)
   Acute Office Visit  Subjective:     Patient ID: Autumn Garcia, female    DOB: October 05, 1960, 62 y.o.   MRN: [id number]  Chief Complaint  Patient presents with   Cough    Cough Associated symptoms include a fever, headaches and wheezing. Pertinent negatives include no sore throat.   Patient is in today for cough and fever x 2wks. She had a sorethroat with exudate on 11/15 and went to UC who prescribed her Omnicef. Her throat felt better later that night so she did not take the antibiotic. 1 wk after, she started feeling sick and developed a wet cough and sneezing. She coughs up white and yellow sputum. For the past wk she has been having a fever on/off. She also feels her ears popping. She no longer has a sore throat. Denies sinus pressure/pain. She has not taken any OTC medications.   Review of Systems  Constitutional:  Positive for fever and malaise/fatigue.  HENT:  Negative for sinus pain and sore throat.   Respiratory:  Positive for cough, sputum production and wheezing.   Neurological:  Positive for headaches.      Objective:    BP 113/60   Pulse (!) 57   Temp 98.4 F (36.9 C)   Resp 12   Ht 5\' 4"  (1.626 m)   Wt 120 lb (54.4 kg)   SpO2 100%   BMI 20.60 kg/m    Physical Exam Constitutional:      Appearance: Normal appearance. She is not ill-appearing.  HENT:     Right Ear: Tympanic membrane, ear canal and external ear normal.     Left Ear: Tympanic membrane, ear canal and external ear normal.     Nose: Nose normal.     Mouth/Throat:     Mouth: Mucous membranes are moist.     Pharynx: No oropharyngeal exudate or posterior oropharyngeal erythema.  Cardiovascular:     Rate and Rhythm: Normal rate and regular rhythm.     Pulses: Normal pulses.     Heart sounds: Normal heart sounds.  Pulmonary:     Effort: Pulmonary effort is normal.     Breath sounds: Wheezing present.  Lymphadenopathy:     Cervical: Cervical adenopathy present.  Neurological:      Mental Status: She is alert.       Assessment & Plan:  Marland KitchenMarland KitchenPandalene was seen today for cough.  Diagnoses and all orders for this visit:  Bacterial lower respiratory infection -     ipratropium-albuterol (DUONEB) 0.5-2.5 (3) MG/3ML nebulizer solution 3 mL -     DG Chest 2 View; Future  Bee allergy status -     EPINEPHrine 0.3 mg/0.3 mL IJ SOAJ injection; USE AS DIRECTED AND AS NEEDED FOR SEVERE ALLERGIC REACTION  Skin infection -     mupirocin ointment (BACTROBAN) 2 %; Apply to affected area TID for 7 days.   Vitals reassuring Duoneb administered in office. With symptomatic improvement from patient.  Advised patient to take Omnicef that UC prescribed her for bacterial infection and possible early pneumonia Recommended OTC Mucinex. Ordered CXR to be completed after antibiotics or earlier if fails to improve.  Refilled EpiPen and Mupirocin ointment.   Tandy Gaw, PA-C

## 2023-09-10 ENCOUNTER — Ambulatory Visit: Payer: Medicare Other

## 2023-09-10 ENCOUNTER — Encounter: Payer: Self-pay | Admitting: Physician Assistant

## 2023-09-10 ENCOUNTER — Ambulatory Visit (INDEPENDENT_AMBULATORY_CARE_PROVIDER_SITE_OTHER): Payer: Medicare Other | Admitting: Physician Assistant

## 2023-09-10 VITALS — BP 110/78 | HR 74 | Ht 64.0 in | Wt 120.0 lb

## 2023-09-10 DIAGNOSIS — J22 Unspecified acute lower respiratory infection: Secondary | ICD-10-CM

## 2023-09-10 DIAGNOSIS — B9689 Other specified bacterial agents as the cause of diseases classified elsewhere: Secondary | ICD-10-CM | POA: Diagnosis not present

## 2023-09-10 DIAGNOSIS — R062 Wheezing: Secondary | ICD-10-CM

## 2023-09-10 MED ORDER — ALBUTEROL SULFATE HFA 108 (90 BASE) MCG/ACT IN AERS: 2.0000 | INHALATION_SPRAY | RESPIRATORY_TRACT | 0 refills | Status: DC | PRN

## 2023-09-10 MED ORDER — PREDNISONE 20 MG PO TABS: ORAL_TABLET | ORAL | 0 refills | Status: DC

## 2023-09-10 NOTE — Patient Instructions (Signed)
Use albuterol inhaler every 4-6 hours as needed for SOB/Wheezing/cough Start prednisone taper

## 2023-09-10 NOTE — Progress Notes (Signed)
Acute Office Visit  Subjective:     Patient ID: Autumn Garcia, female    DOB: 1960/12/06, 62 y.o.   MRN: [id number]  Chief Complaint  Patient presents with   Cough    HPI Patient is in today for persistent sickness. She saw our office on 12/6 for cough and fever x 2 wks. She was treated for bacterial lower respiratory infection with omnicef. She does feel some better but struggling with SOB and chest tightness. She feels like she cannot get a "good breath in". No fever, chills, body aches.   .. Active Ambulatory Problems    Diagnosis Date Noted   ANXIETY DISORDER, GENERALIZED 06/14/2009   HOT FLASHES 09/03/2009   KNEE PAIN 01/26/2009   Other convulsions 01/26/2009   Dissociative identity disorder (HCC) 10/06/2011   PTSD (post-traumatic stress disorder) 11/15/2011   Dissociative disorder 06/03/2012   Ventral hernia without obstruction or gangrene 01/27/2013   Vitamin D deficiency 10/29/2013   Basal cell carcinoma of skin 10/29/2013   Vertigo 11/13/2013   Herpes simplex type II infection 04/26/2014   Hyperlipidemia 04/26/2015   Bee allergy status 09/27/2018   Borderline personality disorder (HCC) 10/03/2011   Recurrent UTI 11/30/2020   IFG (impaired fasting glucose) 11/30/2020   Orthostatic hypotension 01/12/2021   Elevated blood pressure reading 01/12/2021   Impingement syndrome, shoulder, left 06/10/2021   Metatarsalgia 06/10/2021   Folliculitis 03/28/2022   Obsessional thoughts 10/20/2020   Depression 10/20/2020   Anorexia nervosa 10/20/2020   Strep pharyngitis 07/20/2022   Tick bite of ankle 03/05/2023   Bacterial lower respiratory infection 09/07/2023   Resolved Ambulatory Problems    Diagnosis Date Noted   CANDIDIASIS, ORAL 10/15/2009   Acute pharyngitis 05/15/2010   Acute upper respiratory infection 10/15/2009   Acute cystitis 01/26/2010   CELLULITIS, RIGHT LEG 03/18/2010   SKIN RASH 01/26/2009   Skin cancer of forehead 11/15/2011   Chest pain  09/08/2015   Dyspnea 09/08/2015   Dysuria 07/26/2020   Past Medical History:  Diagnosis Date   Anxiety    Chronic kidney disease    Eating disorder    Epilepsy (HCC)    HSV infection    Kidney stones    Murmur    TB (tuberculosis)      Review of Systems  Respiratory:  Positive for cough.         Objective:    BP 110/78   Pulse 74   Ht 5\' 4"  (1.626 m)   Wt 120 lb (54.4 kg)   SpO2 99%   BMI 20.60 kg/m    Physical Exam Constitutional:      Appearance: Normal appearance.  HENT:     Head: Normocephalic.  Cardiovascular:     Rate and Rhythm: Normal rate and regular rhythm.     Pulses: Normal pulses.  Pulmonary:     Effort: Pulmonary effort is normal.     Breath sounds: Wheezing present.     Comments: Bilateral upper lobe lung wheezing.  Musculoskeletal:     Cervical back: Normal range of motion.  Neurological:     General: No focal deficit present.     Mental Status: She is alert.  Psychiatric:        Mood and Affect: Mood normal.     IMPRESSION: No evidence of acute cardiopulmonary process. Mild scoliosis.        Assessment & Plan:  .Kailer was seen today for cough.  Diagnoses and all orders for this visit:  Bacterial lower respiratory  infection -     predniSONE (DELTASONE) 20 MG tablet; Take 3 tablets for 3 days, take 2 tablets for 3 days, take 1 tablet for 3 days, take 1/2 tablet for 4 days. -     albuterol (VENTOLIN HFA) 108 (90 Base) MCG/ACT inhaler; Inhale 2 puffs into the lungs every 4 (four) hours as needed.  Wheezing -     predniSONE (DELTASONE) 20 MG tablet; Take 3 tablets for 3 days, take 2 tablets for 3 days, take 1 tablet for 3 days, take 1/2 tablet for 4 days. -     albuterol (VENTOLIN HFA) 108 (90 Base) MCG/ACT inhaler; Inhale 2 puffs into the lungs every 4 (four) hours as needed.    CXR today which no acute findings Vitals look great today Normal pulse ox Suspect some reactive airway disease Start prednisone and given  albuterol to use for the next few days Finish omnicef Follow up if not improving or symptoms worsening    Return if symptoms worsen or fail to improve.  Tandy Gaw, PA-C

## 2023-09-10 NOTE — Progress Notes (Signed)
Reviewed at office visit with patient today.

## 2023-11-12 ENCOUNTER — Ambulatory Visit (INDEPENDENT_AMBULATORY_CARE_PROVIDER_SITE_OTHER): Payer: Medicare Other | Admitting: Family Medicine

## 2023-11-12 ENCOUNTER — Encounter: Payer: Self-pay | Admitting: Family Medicine

## 2023-11-12 VITALS — BP 127/65 | HR 51

## 2023-11-12 DIAGNOSIS — R21 Rash and other nonspecific skin eruption: Secondary | ICD-10-CM

## 2023-11-12 DIAGNOSIS — L739 Follicular disorder, unspecified: Secondary | ICD-10-CM

## 2023-11-12 DIAGNOSIS — L089 Local infection of the skin and subcutaneous tissue, unspecified: Secondary | ICD-10-CM

## 2023-11-12 MED ORDER — MUPIROCIN 2 % EX OINT: TOPICAL_OINTMENT | CUTANEOUS | 3 refills | Status: DC

## 2023-11-12 NOTE — Progress Notes (Signed)
   Acute Office Visit  Subjective:     Patient ID: Autumn Garcia, female    DOB: [birth year], 63 y.o.   MRN: [id number]  Chief Complaint  Patient presents with   Rash    HPI Patient is in today for rash on her lower legs.  Startred again last week. Has had on nad off for about 4 years.  Worse if she doesn't shave her legs ands the hair get a little longer.  Has tired multiple things through the years including topical steroid, antifungals.  Mupiron works best to improve the rash and benadryl topical helps most with the itching.  Says it flares almost months but has more severe flares where it is so intensely itchy she can't sleep for days.    ROS      Objective:    BP 127/65   Pulse (!) 51   SpO2 99%    Physical Exam Skin:    Comments: Erythematous fine raised papules scatted on lower legs. More concentrated on the shin.      No results found for any visits on 11/12/23.      Assessment & Plan:   Problem List Items Addressed This Visit       Musculoskeletal and Integument   Folliculitis   Relevant Medications   mupirocin  ointment (BACTROBAN ) 2 %   Other Visit Diagnoses       Rash    -  Primary   Relevant Medications   mupirocin  ointment (BACTROBAN ) 2 %     Skin infection       Relevant Medications   mupirocin  ointment (BACTROBAN ) 2 %       Has the appearance of folliculitis on exam.  Avoid irriation of the skin.  Not aggravated by shaving. Not using hot tubs.  Some relief with mupirinc and topical benadry.  Ok to take oral Benadryl as well when it occurs also recommend using cool packs.  Mupirocin  also refilled.  We did discuss that if he gets really severe we could try a round of oral doxycycline  for 5 to 7 days to see if that could be helpful as well.  Meds ordered this encounter  Medications   mupirocin  ointment (BACTROBAN ) 2 %    Sig: Apply to affected area TID for 7 days.    Dispense:  60 g    Refill:  3    No follow-ups on  file.  Duaine German, MD  I spent 20 minutes on the day of the encounter to include pre-visit record review, face-to-face time with the patient and post visit ordering of test.

## 2023-11-12 NOTE — Progress Notes (Signed)
 Rash bilateral legs started last week. She stated that when she uses Mupirocin  it helps with the rash and itching.

## 2023-11-28 ENCOUNTER — Ambulatory Visit (INDEPENDENT_AMBULATORY_CARE_PROVIDER_SITE_OTHER): Payer: Medicare Other | Admitting: Family Medicine

## 2023-11-28 ENCOUNTER — Encounter: Payer: Self-pay | Admitting: Family Medicine

## 2023-11-28 VITALS — BP 123/57 | HR 56 | Ht 64.0 in

## 2023-11-28 DIAGNOSIS — L739 Follicular disorder, unspecified: Secondary | ICD-10-CM

## 2023-11-28 DIAGNOSIS — R21 Rash and other nonspecific skin eruption: Secondary | ICD-10-CM

## 2023-11-28 MED ORDER — CLINDAMYCIN PHOSPHATE 1 % EX GEL: Freq: Two times a day (BID) | CUTANEOUS | 1 refills | Status: DC | PRN

## 2023-11-28 MED ORDER — PIMECROLIMUS 1 % EX CREA: TOPICAL_CREAM | Freq: Two times a day (BID) | CUTANEOUS | 1 refills | Status: DC

## 2023-11-28 NOTE — Progress Notes (Signed)
 Acute Office Visit  Subjective:     Patient ID: Autumn Garcia, female    DOB: 25-Mar-1961, 63 y.o.   MRN: [id number]  Chief Complaint  Patient presents with   Rash    Patient c/o pruritic  rash continuing on  legs and uppper torso- patient states it is spreading. States this is the same rash that provider has been addressing .    HPI Patient is in today for rash that seems to be progressing before it was coming and going on both legs it has the appearance of folliculitis as it just affects the hair follicles with small dots of erythema.  It gets intensely itchy.  And she even scratches in her sleep even though she tries not to during the day.  Sometimes just clothing brushing the skin or even just air blowing across the skin will trigger this sensation.  And then it takes a while to go away.  Please see previous notes that she has tried multiple treatments the only thing that has some consistent improvement is the mupirocin ointment but that does not completely help.  Did some research online and is interested in possibly looking at sulfasalazine or possibly a combination of clindamycin with Elidel.  ROS      Objective:    BP (!) 123/57   Pulse (!) 56   Ht 5\' 4"  (1.626 m)   SpO2 99%   BMI 20.60 kg/m    Physical Exam Vitals reviewed.  Constitutional:      Appearance: Normal appearance.  HENT:     Head: Normocephalic.  Pulmonary:     Effort: Pulmonary effort is normal.  Skin:    Comments: Erythematous fine papular rash on both flanks and on her left thigh and leg.  Areas you can feel the papular dry rash and then other areas you can truly see the erythema over the follicles.  Neurological:     Mental Status: She is alert and oriented to person, place, and time.  Psychiatric:        Mood and Affect: Mood normal.        Behavior: Behavior normal.     No results found for any visits on 11/28/23.      Assessment & Plan:   Problem List Items Addressed This  Visit       Musculoskeletal and Integument   Folliculitis - Primary   Relevant Medications   pimecrolimus (ELIDEL) 1 % cream   clindamycin (CLINDAGEL) 1 % gel   Other Visit Diagnoses       Rash       Relevant Medications   pimecrolimus (ELIDEL) 1 % cream   clindamycin (CLINDAGEL) 1 % gel       I would like to try the Elidel on 1 side of the body and the combination of the clindamycin and the Elidel on the other side, to see which is most effective.  I do think there could be some underlying eczema or dermatitis that is causing the inflammation which is leading to the scratching which is leading to the folliculitis so I think this would be a very reasonable approach.  Meds ordered this encounter  Medications   pimecrolimus (ELIDEL) 1 % cream    Sig: Apply topically 2 (two) times daily.    Dispense:  30 g    Refill:  1   clindamycin (CLINDAGEL) 1 % gel    Sig: Apply topically 2 (two) times daily as needed.    Dispense:  30 g    Refill:  1    No follow-ups on file.  Nani Gasser, MD

## 2023-12-04 DIAGNOSIS — H2513 Age-related nuclear cataract, bilateral: Secondary | ICD-10-CM | POA: Diagnosis not present

## 2024-03-21 ENCOUNTER — Ambulatory Visit: Payer: Self-pay

## 2024-03-21 NOTE — Telephone Encounter (Signed)
 FYI Only or Action Required?: FYI only for provider.  Patient was last seen in primary care on 11/28/2023 by Cydney Draft, MD. Called Nurse Triage reporting Elbow Pain. Symptoms began several months ago. Interventions attempted: Other: brace, exercises. Symptoms are: right elbow pain radiates down hand with numbness and tingling in right handgradually worsening.  Triage Disposition: See PCP When Office is Open (Within 3 Days) (overriding See Physician Within 24 Hours)  Patient/caregiver understands and will follow disposition?: Yes             Copied from CRM [id number]. Topic: Appointments - Appointment Scheduling >> Mar 21, 2024 12:59 PM Kevelyn M wrote: Tingling and numbness in fingers (trapped nerve in elbow) has happened for the last two months Reason for Disposition  Numbness (i.e., loss of sensation) in hand or fingers  Answer Assessment - Initial Assessment Questions Offered patient acute visits this afternoon, patient declined due to she is unable to come in. Patient asked if she could be seen next week with Dr T in the afternoon  (unable to come in on Monday). Called [facility] to confirm if okay to schedule with Dr T, received a  message from a voicemail to not leave a voicemail message. Scheduled patient and advised she may receive a call from the office if the appointment is needed to be rescheduled.   1. ONSET: When did the pain start?     X 2-3 months.  2. LOCATION: Where is the pain located?     Right arm, elbow and radiates down to hand. She states it might be her right shoulder as well.  3. PAIN: How bad is the pain? (Scale 1-10; or mild, moderate, severe)   - MILD (1-3): doesn't interfere with normal activities.   - MODERATE (4-7): interferes with normal activities (e.g., work or school) or awakens from sleep.   - SEVERE (8-10): excruciating pain, unable to do any normal activities, unable to use arm at all.     2/10 at present. Worse when typing, bending  elbow.  4. WORK OR EXERCISE: Has there been any recent work or exercise that involved this part of the body?     She thinks she may have overused it with using her phone on in her garden.  5. CAUSE: What do you think is causing the elbow pain?     Ulnar trapped nerve, she states she researched and thinks that is what she has. She states she has a history of carpal tunnel.  6. OTHER SYMPTOMS: Do you have any other symptoms? (e.g., neck pain, elbow swelling, rash, fever)     Numbness and tingling in right hand. Patient denies rash, fever, elbow swelling or neck pain.  7. PREGNANCY: Is there any chance you are pregnant? When was your last menstrual period?     N/A.  Protocols used: Elbow Pain-A-AH

## 2024-03-25 ENCOUNTER — Ambulatory Visit (INDEPENDENT_AMBULATORY_CARE_PROVIDER_SITE_OTHER): Admitting: Sports Medicine

## 2024-03-25 ENCOUNTER — Encounter: Payer: Self-pay | Admitting: Sports Medicine

## 2024-03-25 ENCOUNTER — Ambulatory Visit

## 2024-03-25 DIAGNOSIS — R202 Paresthesia of skin: Secondary | ICD-10-CM

## 2024-03-25 DIAGNOSIS — R200 Anesthesia of skin: Secondary | ICD-10-CM

## 2024-03-25 DIAGNOSIS — M4802 Spinal stenosis, cervical region: Secondary | ICD-10-CM | POA: Diagnosis not present

## 2024-03-25 DIAGNOSIS — M50222 Other cervical disc displacement at C5-C6 level: Secondary | ICD-10-CM | POA: Diagnosis not present

## 2024-03-25 DIAGNOSIS — M47812 Spondylosis without myelopathy or radiculopathy, cervical region: Secondary | ICD-10-CM | POA: Diagnosis not present

## 2024-03-25 NOTE — Assessment & Plan Note (Signed)
 Very pleasant 63 year old female, she has had a long history of numbness and tingling right hand, fairly migratory but can occur in all fingers. Not significantly worse at night. She has been wearing a carpal tunnel brace at night which does not seem to help. She also endorses that extension of the shoulder reproduces some of her symptoms. There is some neck discomfort as well. On exam she has a positive Tinel sign, positive Phalen sign, she has a positive cubital tunnel Tinel's sign, she also has a positive Spurling sign raising suspicion of carpal tunnel syndrome, cubital tunnel syndrome and cervical radiculopathy. We discussed the anatomy and pathophysiology, we discussed the treatment and the approach to investigation. She would like to start with a nonpharmacologic approach. We will start nocturnal elbow splinting, x-rays of the neck, home physical therapy for the neck, carpal tunnel, cubital tunnel. Return to see me in approximately 6 weeks, at that point if not better we will get a nerve conduction study, we will likely start a neuropathic agent. We did discuss the potential of needing injections however we will defer these until after all of the above workup and failure of oral medications.

## 2024-03-25 NOTE — Progress Notes (Signed)
    Procedures performed today:    None.  Independent interpretation of notes and tests performed by another provider:   None.  Brief History, Exam, Impression, and Recommendations:    Numbness and tingling of right arm Very pleasant 63 year old female, she has had a long history of numbness and tingling right hand, fairly migratory but can occur in all fingers. Not significantly worse at night. She has been wearing a carpal tunnel brace at night which does not seem to help. She also endorses that extension of the shoulder reproduces some of her symptoms. There is some neck discomfort as well. On exam she has a positive Tinel sign, positive Phalen sign, she has a positive cubital tunnel Tinel's sign, she also has a positive Spurling sign raising suspicion of carpal tunnel syndrome, cubital tunnel syndrome and cervical radiculopathy. We discussed the anatomy and pathophysiology, we discussed the treatment and the approach to investigation. She would like to start with a nonpharmacologic approach. We will start nocturnal elbow splinting, x-rays of the neck, home physical therapy for the neck, carpal tunnel, cubital tunnel. Return to see me in approximately 6 weeks, at that point if not better we will get a nerve conduction study, we will likely start a neuropathic agent. We did discuss the potential of needing injections however we will defer these until after all of the above workup and failure of oral medications.    ____________________________________________ Debby PARAS. Curtis, M.D., ABFM., CAQSM., AME. Primary Care and Sports Medicine [facility]  Adjunct Professor of [facility] Medicine  [employer]  Restaurant manager, fast food

## 2024-03-31 DIAGNOSIS — G8929 Other chronic pain: Secondary | ICD-10-CM | POA: Diagnosis not present

## 2024-03-31 DIAGNOSIS — G5603 Carpal tunnel syndrome, bilateral upper limbs: Secondary | ICD-10-CM | POA: Diagnosis not present

## 2024-03-31 DIAGNOSIS — M542 Cervicalgia: Secondary | ICD-10-CM | POA: Diagnosis not present

## 2024-04-06 ENCOUNTER — Emergency Department (HOSPITAL_BASED_OUTPATIENT_CLINIC_OR_DEPARTMENT_OTHER)

## 2024-04-06 ENCOUNTER — Encounter (HOSPITAL_BASED_OUTPATIENT_CLINIC_OR_DEPARTMENT_OTHER): Payer: Self-pay | Admitting: Emergency Medicine

## 2024-04-06 ENCOUNTER — Emergency Department (HOSPITAL_BASED_OUTPATIENT_CLINIC_OR_DEPARTMENT_OTHER): Admission: EM | Admit: 2024-04-06 | Discharge: 2024-04-06 | Disposition: A | Discharge status: Home / Self Care

## 2024-04-06 ENCOUNTER — Other Ambulatory Visit: Payer: Self-pay

## 2024-04-06 DIAGNOSIS — M19041 Primary osteoarthritis, right hand: Secondary | ICD-10-CM | POA: Diagnosis not present

## 2024-04-06 DIAGNOSIS — Z91010 Allergy to peanuts: Secondary | ICD-10-CM | POA: Diagnosis not present

## 2024-04-06 DIAGNOSIS — W231XXA Caught, crushed, jammed, or pinched between stationary objects, initial encounter: Secondary | ICD-10-CM | POA: Diagnosis not present

## 2024-04-06 DIAGNOSIS — Z91040 Latex allergy status: Secondary | ICD-10-CM | POA: Insufficient documentation

## 2024-04-06 DIAGNOSIS — S6991XA Unspecified injury of right wrist, hand and finger(s), initial encounter: Secondary | ICD-10-CM | POA: Insufficient documentation

## 2024-04-06 DIAGNOSIS — S61212A Laceration without foreign body of right middle finger without damage to nail, initial encounter: Secondary | ICD-10-CM | POA: Diagnosis not present

## 2024-04-06 NOTE — Discharge Instructions (Signed)
 Clean your finger 2 x a day with antibacterial soap and water Bandage your finger daily until the wound heals Contact a health care provider if: A wound that was sutured opens up. You have more redness, swelling, or pain in your hand. You have more fluid or blood coming from your hand. Your hand feels warm to the touch. You have pus or a bad smell coming from your hand. You have a fever. Get help right away if: You suddenly develop severe pain in your hand. You previously had sensation in your hand and you suddenly lose sensation. Your wrist or hand becomes bent (contracted)involuntarily. Your symptoms had improved and they suddenly get worse. Your hand or fingers are turning pink or blue.

## 2024-04-06 NOTE — ED Triage Notes (Signed)
 Pt reports smashing first three fingers of the RT hand between two large rocks, edema noted and a laceration on the middle finger, bleeding controlled

## 2024-04-06 NOTE — ED Provider Notes (Signed)
 [facility] Provider Note   CSN: [id number] Arrival date & time: 04/06/24  1321     Patient presents with: Finger Injury   Autumn Garcia is a 63 y.o. female.  {Add pertinent medical, surgical, social history, OB history to HPI:32947} HPI     Prior to Admission medications   Medication Sig Start Date End Date Taking? Authorizing Provider  albuterol  (VENTOLIN  HFA) 108 (90 Base) MCG/ACT inhaler Inhale 2 puffs into the lungs every 4 (four) hours as needed. 09/10/23   Breeback, Jade L, PA-C  clindamycin  (CLINDAGEL) 1 % gel Apply topically 2 (two) times daily as needed. 11/28/23   Alvan Dorothyann BIRCH, MD  Cranberry-Olive Leaf (URINARY TRACT HEALTH PO) Take by mouth daily.    [provider]  diazepam  (VALIUM ) 2 MG tablet Take 1 tablet (2 mg total) by mouth every 8 (eight) hours as needed for anxiety. 03/28/22   Alvan Dorothyann BIRCH, MD  EPINEPHrine  0.3 mg/0.3 mL IJ SOAJ injection USE AS DIRECTED AND AS NEEDED FOR SEVERE ALLERGIC REACTION 09/07/23   Breeback, Jade L, PA-C  L-Lysine 500 MG TABS Take 500 mg by mouth. 2 times a week    [provider]  MAGNESIUM CITRATE PO Take 400 mg by mouth 2 (two) times daily.    [provider]  mupirocin  ointment (BACTROBAN ) 2 % Apply to affected area TID for 7 days. 11/12/23   Alvan Dorothyann BIRCH, MD  pimecrolimus  (ELIDEL ) 1 % cream Apply topically 2 (two) times daily. 11/28/23   Alvan Dorothyann BIRCH, MD    Allergies: Demerol [meperidine], Latex, Nickel, Other, Ciprofloxacin, Citrus, Codeine, Erythromycin, Lactose, Lidocaine , Meperidine hcl, Meperidine hcl, Peanut oil, Peanut-containing drug products, Soy allergy (obsolete), and Valacyclovir     Review of Systems  Updated Vital Signs BP 135/81 (BP Location: Right Arm)   Pulse (!) 59   Temp 97.7 F (36.5 C)   Resp 16   Ht 5' 3 (1.6 m)   Wt 54.4 kg   SpO2 100%   BMI 21.26 kg/m   Physical Exam  (all labs  ordered are listed, but only abnormal results are displayed) Labs Reviewed - No data to display  EKG: None  Radiology: DG Hand Complete Right Result Date: 04/06/2024 CLINICAL DATA:  Smashing injury a first 3 fingers of right hand between 2 rocks. Laceration to middle finger. EXAM: RIGHT HAND - COMPLETE 3+ VIEW COMPARISON:  None Available. FINDINGS: There is no evidence of acute fracture or dislocation. Degenerative changes are present at the interphalangeal joints and first carpometacarpal joint. No radiopaque foreign body is seen. IMPRESSION: No acute fracture or dislocation. Electronically Signed   By: Leita Birmingham M.D.   On: 04/06/2024 13:58    {Document cardiac monitor, telemetry assessment procedure when appropriate:32947} Procedures   Medications Ordered in the ED - No data to display    {Click here for ABCD2, HEART and other calculators REFRESH Note before signing:1}                              Medical Decision Making Amount and/or Complexity of Data Reviewed Radiology: ordered.   ***  {Document critical care time when appropriate  Document review of labs and clinical decision tools ie CHADS2VASC2, etc  Document your independent review of radiology images and any outside records  Document your discussion with family members, caretakers and with consultants  Document social determinants of health affecting pt's care  Document your decision making why or why not admission, treatments were needed:32947:::1}   Final diagnoses:  None    ED Discharge Orders     None

## 2024-04-09 ENCOUNTER — Encounter: Payer: Self-pay | Admitting: Family Medicine

## 2024-04-09 ENCOUNTER — Ambulatory Visit (INDEPENDENT_AMBULATORY_CARE_PROVIDER_SITE_OTHER): Admitting: Family Medicine

## 2024-04-09 VITALS — BP 123/63 | HR 58 | Ht 64.0 in

## 2024-04-09 DIAGNOSIS — S6991XA Unspecified injury of right wrist, hand and finger(s), initial encounter: Secondary | ICD-10-CM | POA: Diagnosis not present

## 2024-04-09 DIAGNOSIS — R21 Rash and other nonspecific skin eruption: Secondary | ICD-10-CM | POA: Diagnosis not present

## 2024-04-09 DIAGNOSIS — L089 Local infection of the skin and subcutaneous tissue, unspecified: Secondary | ICD-10-CM | POA: Diagnosis not present

## 2024-04-09 DIAGNOSIS — L739 Follicular disorder, unspecified: Secondary | ICD-10-CM

## 2024-04-09 MED ORDER — CLINDAMYCIN PHOS (TWICE-DAILY) 1 % EX GEL: Freq: Two times a day (BID) | CUTANEOUS | 1 refills | Status: AC

## 2024-04-09 MED ORDER — PIMECROLIMUS 1 % EX CREA: TOPICAL_CREAM | Freq: Two times a day (BID) | CUTANEOUS | 1 refills | Status: AC

## 2024-04-09 MED ORDER — MUPIROCIN 2 % EX OINT: TOPICAL_OINTMENT | CUTANEOUS | 3 refills | Status: AC

## 2024-04-09 NOTE — Progress Notes (Signed)
   Acute Office Visit  Subjective:     Patient ID: Autumn Garcia, female    DOB: 01/29/1961, 63 y.o.   MRN: [id number]  Chief Complaint  Patient presents with   injury to finger    HPI Patient is in today for injury to middle finger, between rocks.  She was helping her husband move some very large stones in the yard.  She did seek urgent care visit on July 6 and they did do an x-ray just to rule out fracture of the tip of the finger.  She has been keeping the wound clean and applying mupirocin  ointment.  Covering with gauze and then Coban.  She says that today is the first day that it has not actually bled.  F/U folliculitis.  She has done well with the combination of the mupirocin , clindamycin  gel and the Protopic when she breaks out particularly on her legs.  She had a flare recently.  She would like refills  ROS      Objective:    BP 123/63   Pulse (!) 58   Ht 5' 4 (1.626 m)   SpO2 100%   BMI 20.60 kg/m    Physical Exam  No results found for any visits on 04/09/24.      Assessment & Plan:   Problem List Items Addressed This Visit       Musculoskeletal and Integument   Folliculitis   Relevant Medications   mupirocin  ointment (BACTROBAN ) 2 %   pimecrolimus  (ELIDEL ) 1 % cream   Other Visit Diagnoses       Injury of finger of right hand, initial encounter    -  Primary     Rash       Relevant Medications   mupirocin  ointment (BACTROBAN ) 2 %   pimecrolimus  (ELIDEL ) 1 % cream     Skin infection       Relevant Medications   mupirocin  ointment (BACTROBAN ) 2 %   pimecrolimus  (ELIDEL ) 1 % cream      Injury to the tip of her finger looks like it is healing well no sign of acute infection.  A little bit of dried blood but no active drainage or bleeding today.  There is a little bit of a hematoma on the pad of the middle finger.  Continue current wound care monitor for infection call if any concerns or problems.  Meds ordered this encounter   Medications   mupirocin  ointment (BACTROBAN ) 2 %    Sig: Apply to affected area TID for 7 days.    Dispense:  60 g    Refill:  3   pimecrolimus  (ELIDEL ) 1 % cream    Sig: Apply topically 2 (two) times daily.    Dispense:  30 g    Refill:  1   clindamycin  (CLINDAGEL) 1 % gel    Sig: Apply topically 2 (two) times daily.    Dispense:  30 g    Refill:  1    No follow-ups on file.  Dorothyann Byars, MD

## 2024-04-11 ENCOUNTER — Telehealth: Payer: Self-pay

## 2024-04-11 ENCOUNTER — Encounter: Payer: Self-pay | Admitting: Family Medicine

## 2024-04-11 NOTE — Telephone Encounter (Signed)
 This patient should have been triage so that somebody could have looked at the finger to see if there was serous drainage or actual discharge.

## 2024-04-11 NOTE — Telephone Encounter (Signed)
 Spoke to the patient, she will upload a photo of her finger. Patient mentioned it was a very small area that was oozing yellowish liquid. Patient is aware she may be sent an antibiotic if needed to her pharmacy.

## 2024-04-11 NOTE — Telephone Encounter (Signed)
 Patient came into office stating that she has a little yellow coming out of her finger and isnt sure if its infected, patient would like to see if she could get some medication sent into her pharmacy, please advise, thanks.

## 2024-04-16 NOTE — Telephone Encounter (Signed)
 Noted

## 2024-05-15 ENCOUNTER — Ambulatory Visit: Admitting: Family Medicine

## 2024-06-03 ENCOUNTER — Encounter: Payer: Self-pay | Admitting: Sports Medicine

## 2024-07-18 ENCOUNTER — Telehealth: Payer: Self-pay

## 2024-07-18 NOTE — Telephone Encounter (Signed)
 Left message for a return call for colon cancer screening. She is due for another Cologuard.

## 2024-08-30 ENCOUNTER — Encounter: Payer: Self-pay | Admitting: Family Medicine

## 2024-09-01 NOTE — Telephone Encounter (Signed)
 Ok to remove and notify pt when done
